# Patient Record
Sex: Male | Born: 1950 | Race: Asian | Hispanic: No | Marital: Married | Smoking: Former smoker
Health system: Southern US, Community
[De-identification: ages and names within clinical notes are randomized; demographics above are authoritative.]

## PROBLEM LIST (undated history)

## (undated) DIAGNOSIS — I10 Essential (primary) hypertension: Secondary | ICD-10-CM

## (undated) DIAGNOSIS — R3915 Urgency of urination: Secondary | ICD-10-CM

## (undated) DIAGNOSIS — N400 Enlarged prostate without lower urinary tract symptoms: Secondary | ICD-10-CM

## (undated) DIAGNOSIS — K219 Gastro-esophageal reflux disease without esophagitis: Secondary | ICD-10-CM

## (undated) DIAGNOSIS — E785 Hyperlipidemia, unspecified: Secondary | ICD-10-CM

## (undated) DIAGNOSIS — N433 Hydrocele, unspecified: Secondary | ICD-10-CM

## (undated) DIAGNOSIS — Z8711 Personal history of peptic ulcer disease: Secondary | ICD-10-CM

## (undated) HISTORY — PX: ABDOMINAL SURGERY: SHX537

## (undated) HISTORY — DX: Hyperlipidemia, unspecified: E78.5

## (undated) HISTORY — PX: EYE SURGERY: SHX253

## (undated) HISTORY — PX: HERNIA REPAIR: SHX51

---

## 2012-11-25 ENCOUNTER — Encounter (HOSPITAL_COMMUNITY): Payer: Self-pay | Admitting: Emergency Medicine

## 2012-11-25 ENCOUNTER — Emergency Department (HOSPITAL_COMMUNITY)
Admission: EM | Admit: 2012-11-25 | Discharge: 2012-11-25 | Disposition: A | Discharge status: Home / Self Care | Payer: BC Managed Care – PPO | Attending: Emergency Medicine | Admitting: Emergency Medicine

## 2012-11-25 ENCOUNTER — Emergency Department (HOSPITAL_COMMUNITY): Payer: BC Managed Care – PPO

## 2012-11-25 ENCOUNTER — Ambulatory Visit (INDEPENDENT_AMBULATORY_CARE_PROVIDER_SITE_OTHER): Payer: BC Managed Care – PPO | Admitting: Emergency Medicine

## 2012-11-25 VITALS — BP 178/110 | HR 80 | Temp 98.5°F | Resp 16 | Ht 60.0 in | Wt 122.0 lb

## 2012-11-25 DIAGNOSIS — E860 Dehydration: Secondary | ICD-10-CM

## 2012-11-25 DIAGNOSIS — Z87891 Personal history of nicotine dependence: Secondary | ICD-10-CM | POA: Insufficient documentation

## 2012-11-25 DIAGNOSIS — E119 Type 2 diabetes mellitus without complications: Secondary | ICD-10-CM | POA: Insufficient documentation

## 2012-11-25 DIAGNOSIS — Z8639 Personal history of other endocrine, nutritional and metabolic disease: Secondary | ICD-10-CM | POA: Insufficient documentation

## 2012-11-25 DIAGNOSIS — I10 Essential (primary) hypertension: Secondary | ICD-10-CM

## 2012-11-25 DIAGNOSIS — R000 Tachycardia, unspecified: Secondary | ICD-10-CM

## 2012-11-25 DIAGNOSIS — Z862 Personal history of diseases of the blood and blood-forming organs and certain disorders involving the immune mechanism: Secondary | ICD-10-CM | POA: Insufficient documentation

## 2012-11-25 DIAGNOSIS — R002 Palpitations: Secondary | ICD-10-CM | POA: Insufficient documentation

## 2012-11-25 DIAGNOSIS — R079 Chest pain, unspecified: Secondary | ICD-10-CM

## 2012-11-25 DIAGNOSIS — I200 Unstable angina: Secondary | ICD-10-CM

## 2012-11-25 DIAGNOSIS — R0789 Other chest pain: Secondary | ICD-10-CM | POA: Insufficient documentation

## 2012-11-25 HISTORY — DX: Essential (primary) hypertension: I10

## 2012-11-25 LAB — CBC
MCH: 30.2 pg (ref 26.0–34.0)
MCHC: 35.4 g/dL (ref 30.0–36.0)
Platelets: 278 10*3/uL (ref 150–400)
RBC: 5.23 MIL/uL (ref 4.22–5.81)
RDW: 13 % (ref 11.5–15.5)

## 2012-11-25 LAB — BASIC METABOLIC PANEL
CO2: 23 mEq/L (ref 19–32)
GFR calc non Af Amer: >90 mL/min (ref >90)
Glucose, Bld: 100 mg/dL — ABNORMAL HIGH (ref 70–99)
Potassium: 3.5 mEq/L (ref 3.5–5.1)
Sodium: 138 mEq/L (ref 135–145)

## 2012-11-25 LAB — URINALYSIS, ROUTINE W REFLEX MICROSCOPIC
Hgb urine dipstick: NEGATIVE
Specific Gravity, Urine: 1.012 (ref 1.005–1.030)
Urobilinogen, UA: 0.2 mg/dL (ref 0.0–1.0)

## 2012-11-25 LAB — POCT I-STAT TROPONIN I: Troponin i, poc: 0 ng/mL (ref 0.00–0.08)

## 2012-11-25 MED ORDER — HYDROCHLOROTHIAZIDE 25 MG PO TABS
25.0000 mg | ORAL_TABLET | Freq: Once | ORAL | Status: AC
  Administered 2012-11-25: 25 mg via ORAL
  Filled 2012-11-25: qty 1

## 2012-11-25 MED ORDER — HYDROCHLOROTHIAZIDE 25 MG PO TABS: 25.0000 mg | ORAL_TABLET | Freq: Once | ORAL | Status: DC

## 2012-11-25 MED ORDER — LISINOPRIL 20 MG PO TABS
20.0000 mg | ORAL_TABLET | Freq: Once | ORAL | Status: AC
  Administered 2012-11-25: 20 mg via ORAL
  Filled 2012-11-25: qty 1

## 2012-11-25 MED ORDER — ASPIRIN 81 MG PO CHEW
324.0000 mg | CHEWABLE_TABLET | Freq: Once | ORAL | Status: AC
  Administered 2012-11-25: 324 mg via ORAL

## 2012-11-25 MED ORDER — LISINOPRIL 20 MG PO TABS: 20.0000 mg | ORAL_TABLET | Freq: Once | ORAL | Status: DC

## 2012-11-25 NOTE — ED Provider Notes (Signed)
Medical screening examination/treatment/procedure(s) were conducted as a shared visit with non-physician practitioner(s) and myself.  I personally evaluated the patient during the encounter  Lance Oneal is a 62 y.o. male hx of HTN here with intermittent chest pain and palpitations. Has been going on for several weeks. Went to urgent care and sent for eval. No chest pain currently. Was hypertensive that improved with PO meds. Trop neg x 2. Will d/c home on HCTZ, lisinopril. Recommend cardiology and pmd f/u.    Richardean Canal, MD 11/25/12 [phone]

## 2012-11-25 NOTE — ED Notes (Signed)
Pt denies chest pain currently. Pt alert and mentating appropriately. NAD noted at this time.

## 2012-11-25 NOTE — ED Notes (Signed)
Pt presents to ED via EMS with c/o chest pain and heart racing while urinating at night.. Pt from urgent care. NAD.

## 2012-11-25 NOTE — Progress Notes (Signed)
The patient was brought to room 7 urgently due to unstable angina.  Placed on O2 [place] 2 L. He received 324 mg ASA chewed.  IV started, LEFT AC, NS KVO. Monitor reveals a rate of 74, NSR.  He denies pain, SOB, and asks if he can go to sleep. EMS contacted for transport.  Fernande Bras, PA-C Certified Physician Assistant [facility] 10:20 am

## 2012-11-25 NOTE — Progress Notes (Signed)
[facility] [address] [place] [address] [phone]  Date:  11/25/2012   Name:  Lance Oneal   DOB:  11-20-1950   MRN:  [id number]  PCP:  No primary provider on file.    Chief Complaint: Tachycardia and Testicle Pain   History of Present Illness:  Lance Oneal is a 62 y.o. very pleasant male patient who presents with the following:  Patient describes months long duration intermittent sensation of rapid heart rate associated with pounding forceful beats.  Associated at times with chest pressure that is not radiating.  Says spells last minutes to 10's of minutes and pass with rest.  Occasionally has pressure in his left chest as he is having now.  Says it is "normal" for him.  He takes no medications and stopped smoking 7 years ago.  Gets regular exercise.  No shortness of breath, nausea or diaphoresis and no radiation of pain.  Unclear about when pain started today.  No improvement with over the counter medications or other home remedies. Denies other complaint or health concern today.   There are no active problems to display for this patient.   History reviewed. No pertinent past medical history.  Past Surgical History  Procedure Laterality Date  . Hernia repair      History  Substance Use Topics  . Smoking status: Former Games developer  . Smokeless tobacco: Not on file  . Alcohol Use: Yes    No family history on file.  No Known Allergies  Medication list has been reviewed and updated.  No current outpatient prescriptions on file prior to visit.   No current facility-administered medications on file prior to visit.    Review of Systems:  As per HPI, otherwise negative.    Physical Examination: Filed Vitals:   11/25/12 0910  BP: 178/110  Pulse: 80  Temp: 98.5 F (36.9 C)  Resp: 16   Filed Vitals:   11/25/12 0910  Height: 5' (1.524 m)  Weight: 122 lb (55.339 kg)   Body mass index is 23.83 kg/(m^2). Ideal Body Weight: Weight in (lb) to have  BMI = 25: 127.7  GEN: WDWN, NAD, Non-toxic, A & O x 3 HEENT: Atraumatic, Normocephalic. Neck supple. No masses, No LAD. Ears and Nose: No external deformity. CV: RRR, No M/G/R. No JVD. No thrill. No extra heart sounds. PULM: CTA B, no wheezes, crackles, rhonchi. No retractions. No resp. distress. No accessory muscle use. ABD: S, NT, ND, +BS. No rebound. No HSM. EXTR: No c/c/e NEURO Normal gait.  PSYCH: Normally interactive. Conversant. Not depressed or anxious appearing.  Calm demeanor.    Assessment and Plan: Chest pain PSVT by history ER evaluation   Signed,  Phillips Odor, MD  EKG unremarkable

## 2012-11-25 NOTE — ED Provider Notes (Signed)
History     CSN: [id number]  Arrival date & time 11/25/12  1044   First MD Initiated Contact with Patient 11/25/12 1054      Chief Complaint  Patient presents with  . Chest Pain    (Consider location/radiation/quality/duration/timing/severity/associated sxs/prior treatment) HPI Comments: Patient presents from [place] urgent care with complaint of intermittent chest "heaviness" intermittently for the past several weeks as well as complaint of racing heart and strong heart beats when he awakes at night to urinate. No lightheadedness or syncope with these episodes. Symptoms resolve after a short period of time. Patient is [place] but speaks [place]. He has not seen a doctor in at least 7 years. He has been told in the past that he has high blood pressure but is not currently on any medications for this. He does not know if he has a history of hypercholesterolemia, diabetes. Patient is a remote smoker. Patient states that his mother had a history of high blood pressure. Patient is currently in no distress or pain. No treatments prior to arrival. Onset of symptoms acute. Course is intermittent. Nothing makes symptoms better or worse.  Patient is a 62 y.o. male presenting with chest pain. The history is provided by the patient.  Chest Pain Associated symptoms: palpitations   Associated symptoms: no abdominal pain, no back pain, no cough, no diaphoresis, no fever, no nausea, no shortness of breath and not vomiting     Past Medical History  Diagnosis Date  . Hypertension     Past Surgical History  Procedure Laterality Date  . Hernia repair      History reviewed. No pertinent family history.  History  Substance Use Topics  . Smoking status: Former Games developer  . Smokeless tobacco: Not on file  . Alcohol Use: Yes      Review of Systems  Constitutional: Negative for fever and diaphoresis.  HENT: Negative for neck pain.   Eyes: Negative for redness.  Respiratory: Negative for cough  and shortness of breath.   Cardiovascular: Positive for chest pain and palpitations. Negative for leg swelling.  Gastrointestinal: Negative for nausea, vomiting and abdominal pain.  Genitourinary: Negative for dysuria.  Musculoskeletal: Negative for back pain.  Skin: Negative for rash.  Neurological: Negative for syncope and light-headedness.    Allergies  Review of patient's allergies indicates no known allergies.  Home Medications   Current Outpatient Rx  Name  Route  Sig  Dispense  Refill  . fish oil-omega-3 fatty acids 1000 MG capsule   Oral   Take 1 g by mouth daily.         . hydrochlorothiazide (HYDRODIURIL) 25 MG tablet   Oral   Take 1 tablet (25 mg total) by mouth once.   30 tablet   1   . lisinopril (PRINIVIL,ZESTRIL) 20 MG tablet   Oral   Take 1 tablet (20 mg total) by mouth once.   30 tablet   1     BP 179/106  Temp(Src) 98.2 F (36.8 C) (Oral)  Resp 14  SpO2 100%  Physical Exam  Nursing note and vitals reviewed. Constitutional: He appears well-developed and well-nourished.  HENT:  Head: Normocephalic and atraumatic.  Mouth/Throat: Mucous membranes are normal. Mucous membranes are not dry.  Eyes: Conjunctivae are normal.  Neck: Trachea normal and normal range of motion. Neck supple. Normal carotid pulses and no JVD present. No muscular tenderness present. Carotid bruit is not present. No tracheal deviation present.  Cardiovascular: Normal rate, regular rhythm, S1 normal, S2  normal, normal heart sounds and intact distal pulses.  Exam reveals no distant heart sounds and no decreased pulses.   No murmur heard. Pulmonary/Chest: Effort normal and breath sounds normal. No respiratory distress. He has no wheezes. He exhibits no tenderness.  Abdominal: Soft. Normal aorta and bowel sounds are normal. There is no tenderness. There is no rebound and no guarding.  Musculoskeletal: He exhibits no edema.  Neurological: He is alert.  Skin: Skin is warm and dry. He  is not diaphoretic. No cyanosis. No pallor.  Psychiatric: He has a normal mood and affect.    ED Course  Procedures (including critical care time)  Labs Reviewed  BASIC METABOLIC PANEL - Abnormal; Notable for the following:    Glucose, Bld 100 (*)    All other components within normal limits  CBC  URINALYSIS, ROUTINE W REFLEX MICROSCOPIC  POCT I-STAT TROPONIN I  POCT I-STAT TROPONIN I   Dg Chest 2 View  11/25/2012   *RADIOLOGY REPORT*  Clinical Data: Heart palpitations  CHEST - 2 VIEW  Comparison: None.  Findings: Normal heart, mediastinal, hilar contours.  The lungs are normally expanded and clear. No acute bony abnormality.  IMPRESSION: No acute cardiopulmonary disease.   Original Report Authenticated By: Britta Mccreedy, M.D.    1. Palpitations   2. Chest pain   3. Hypertension     11:07 AM Patient seen and examined. Work-up initiated. Medications ordered.   Vital signs reviewed and are as follows: Filed Vitals:   11/25/12 1055  BP: 179/106  Temp: 98.2 F (36.8 C)  Resp: 14    Date: 11/25/2012  Rate: 59  Rhythm: normal sinus rhythm  QRS Axis: left  Intervals: normal  ST/T Wave abnormalities: nonspecific T wave changes  Conduction Disutrbances:left anterior fascicular block  Narrative Interpretation:   Old EKG Reviewed: none available  12:48 PM Patient d/w and seen by Dr. Silverio Lay. HTN meds given. Will prescribe for home. Will give cardiology referral. Awaiting 2nd marker prior to discharge.   3:18 PM 2nd troponin is negative. Will d/c to home.   Patient was informed of high blood pressure reading today.  Patient was counseled about long-term health problems hypertension can cause and was urged to follow-up with a primary care doctor in the next week for a blood pressure recheck.  Urged to take medication everyday. Patient verbalized understanding.  Cardiology referral given and patient urged to f/u.   Patient was counseled to return with severe chest pain, especially  if the pain is crushing or pressure-like and spreads to the arms, back, neck, or jaw, or if they have sweating, nausea, or shortness of breath with the pain. They were encouraged to call 911 with these symptoms.   They were also told to return if their chest pain gets worse and does not go away with rest, they have an attack of chest pain lasting longer than usual despite rest and treatment with the medications their caregiver has prescribed, if they wake from sleep with chest pain or shortness of breath, if they feel dizzy or faint, if they have chest pain not typical of their usual pain, or if they have any other emergent concerns regarding their health.  The patient verbalized understanding and agreed.   BP 165/96  Pulse 94  Temp(Src) 98.2 F (36.8 C) (Oral)  Resp 23  SpO2 99%     MDM  Patient with cc: palpitations and occasional chest heaviness not present now. EKG non-ischemic. CXR neg. Labs reassuring, normal renal fcn.  BP persistently elevated here, lisinopril and hctz ordered. Pt to be d/c home on these per JNC 7 guidelines stage 2 HTN, no compelling indications. Cardiology referral for complete risk assessment, eval of palpitations. Do not suspect PE.         Makhai Fulco, PA-C 11/25/12 [phone]

## 2012-11-25 NOTE — ED Notes (Signed)
Pt discharged to home with family. NAD.  

## 2012-12-02 ENCOUNTER — Ambulatory Visit (INDEPENDENT_AMBULATORY_CARE_PROVIDER_SITE_OTHER): Payer: BC Managed Care – PPO | Admitting: Family Medicine

## 2012-12-02 VITALS — BP 130/71 | HR 69 | Temp 97.9°F | Resp 16 | Ht 60.78 in | Wt 122.2 lb

## 2012-12-02 DIAGNOSIS — I10 Essential (primary) hypertension: Secondary | ICD-10-CM

## 2012-12-02 MED ORDER — HYDROCHLOROTHIAZIDE 25 MG PO TABS: 25.0000 mg | ORAL_TABLET | Freq: Once | ORAL | Status: DC

## 2012-12-02 MED ORDER — LISINOPRIL 20 MG PO TABS: 20.0000 mg | ORAL_TABLET | Freq: Once | ORAL | Status: DC

## 2012-12-02 NOTE — Patient Instructions (Signed)

## 2012-12-02 NOTE — Progress Notes (Signed)
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 He was seen in the emergency department several days ago and a complete workup was done the air revealing no heart disease or underlying renovascular disease. He started on lisinopril and hydrochlorothiazide and has felt fine since. He needs refills on his prescriptions.  Objective: No acute distress, blood pressure recheck 125/80 Neck: Supple no adenopathy or bruits Chest: Clear Heart: Regular no murmur Results for orders placed during the hospital encounter of 11/25/12  CBC      Result Value Range   WBC 8.0  4.0 - 10.5 K/uL   RBC 5.23  4.22 - 5.81 MIL/uL   Hemoglobin 15.8  13.0 - 17.0 g/dL   HCT 16.1  09.6 - 04.5 %   MCV 85.3  78.0 - 100.0 fL   MCH 30.2  26.0 - 34.0 pg   MCHC 35.4  30.0 - 36.0 g/dL   RDW 40.9  81.1 - 91.4 %   Platelets 278  150 - 400 K/uL  BASIC METABOLIC PANEL      Result Value Range   Sodium 138  135 - 145 mEq/L   Potassium 3.5  3.5 - 5.1 mEq/L   Chloride 104  96 - 112 mEq/L   CO2 23  19 - 32 mEq/L   Glucose, Bld 100 (*) 70 - 99 mg/dL   BUN 17  6 - 23 mg/dL   Creatinine, Ser 7.82  0.50 - 1.35 mg/dL   Calcium 8.8  8.4 - 95.6 mg/dL   GFR calc non Af Amer >90  >90 mL/min   GFR calc Af Amer >90  >90 mL/min  URINALYSIS, ROUTINE W REFLEX MICROSCOPIC      Result Value Range   Color, Urine YELLOW  YELLOW   APPearance CLEAR  CLEAR   Specific Gravity, Urine 1.012  1.005 - 1.030   pH 7.5  5.0 - 8.0   Glucose, UA NEGATIVE  NEGATIVE mg/dL   Hgb urine dipstick NEGATIVE  NEGATIVE   Bilirubin Urine NEGATIVE  NEGATIVE   Ketones, ur NEGATIVE  NEGATIVE mg/dL   Protein, ur NEGATIVE  NEGATIVE mg/dL   Urobilinogen, UA 0.2  0.0 - 1.0 mg/dL   Nitrite NEGATIVE  NEGATIVE   Leukocytes, UA NEGATIVE  NEGATIVE  POCT I-STAT TROPONIN I      Result Value Range   Troponin i, poc 0.00  0.00 - 0.08 ng/mL   Comment 3           POCT I-STAT TROPONIN I      Result Value Range   Troponin i,  poc 0.00  0.00 - 0.08 ng/mL   Comment 3            Assessment: Hypertension, controlled  Hypertension - Plan: lisinopril (PRINIVIL,ZESTRIL) 20 MG tablet, hydrochlorothiazide (HYDRODIURIL) 25 MG tablet  I've asked patient to come back in 3 months when he is near running out of his medicines Maureen Ralphs check his pressure and possibly reduce his antihypertensive regime.  Signed, PERRLA

## 2012-12-30 ENCOUNTER — Ambulatory Visit (INDEPENDENT_AMBULATORY_CARE_PROVIDER_SITE_OTHER): Payer: BC Managed Care – PPO | Admitting: Physician Assistant

## 2012-12-30 VITALS — BP 121/65 | HR 83 | Temp 98.0°F | Resp 16 | Ht 61.0 in | Wt 122.6 lb

## 2012-12-30 DIAGNOSIS — I10 Essential (primary) hypertension: Secondary | ICD-10-CM

## 2012-12-30 DIAGNOSIS — R05 Cough: Secondary | ICD-10-CM

## 2012-12-30 DIAGNOSIS — R059 Cough, unspecified: Secondary | ICD-10-CM

## 2012-12-30 MED ORDER — LOSARTAN POTASSIUM 50 MG PO TABS: 50.0000 mg | ORAL_TABLET | Freq: Every day | ORAL | Status: DC

## 2012-12-30 NOTE — Patient Instructions (Addendum)
Continue to take hydrochlorothiazide.  Stop the lisinopril, we think it may be causing you to cough and have a dry throat.  Start taking losartan.

## 2012-12-30 NOTE — Progress Notes (Signed)
  Subjective:    Patient ID: Lance Oneal, male    DOB: [birth year], 62 y.o.   MRN: [id number]  HPI  Presents with dry throat and cough x 3-4 weeks. Communication is limited by language barrier. States his BP was high at a previous visit and he was placed on lisinopril and HCTZ. Cough is worse early morning, late evening. Cough is dry and non productive, no blood. Identifies painful area on left side of neck. States he otherwise feels fine. Denies: fever/chills, nasal congestion, rhinorrhea, nausea/vomiting.   Review of Systems Denies: fever, headache, vision changes, SOB, chest pain, urinary symptoms.     Objective:   Physical Exam  BP 121/65  Pulse 83  Temp(Src) 98 F (36.7 C) (Oral)  Resp 16  Ht 5\' 1"  (1.549 m)  Wt 122 lb 9.6 oz (55.611 kg)  BMI 23.18 kg/m2  SpO2 100%  General: WDWN male, appears stated age, NAD, mild language barrier HEENT: normocephalic, atraumatic, PERLA, TMs clear with visible bony landmarks, turbinates normal no visible discharge, uvula midline, posterior pharynx and tonsils without erythema edema exudate, no palpable lymphadenopathy Resp: clear to auscultation anterior & posterior bilaterally, no rales/rhonchi/wheezes Cardiac: RRR, no murmurs/rubs/gallops Extremities: moves all limbs spontaneously, radial pulses present and even bilaterally, no pitting edema      Assessment & Plan:  Cough  HTN (hypertension) - Plan: losartan (COZAAR) 50 MG tablet  Suspect cough and irritated throat are caused by lisinopril. D/C lisinopril.  Continue HCTZ.  Begin losartan.  Return if cough does not improve.

## 2013-01-01 DIAGNOSIS — I10 Essential (primary) hypertension: Secondary | ICD-10-CM | POA: Insufficient documentation

## 2013-02-26 ENCOUNTER — Ambulatory Visit (INDEPENDENT_AMBULATORY_CARE_PROVIDER_SITE_OTHER): Payer: BC Managed Care – PPO | Admitting: Family Medicine

## 2013-02-26 ENCOUNTER — Encounter: Payer: Self-pay | Admitting: Family Medicine

## 2013-02-26 ENCOUNTER — Encounter: Payer: Self-pay | Admitting: Internal Medicine

## 2013-02-26 VITALS — BP 128/76 | HR 72 | Temp 98.6°F | Ht 61.0 in | Wt 123.4 lb

## 2013-02-26 DIAGNOSIS — I10 Essential (primary) hypertension: Secondary | ICD-10-CM

## 2013-02-26 DIAGNOSIS — Z0000 Encounter for general adult medical examination without abnormal findings: Secondary | ICD-10-CM

## 2013-02-26 LAB — BASIC METABOLIC PANEL
BUN: 23 mg/dL (ref 6–23)
Calcium: 9.2 mg/dL (ref 8.4–10.5)
Creatinine, Ser: 1 mg/dL (ref 0.4–1.5)
GFR: 82.42 mL/min (ref >60.00)
Glucose, Bld: 98 mg/dL (ref 70–99)
Sodium: 137 mEq/L (ref 135–145)

## 2013-02-26 LAB — CBC WITH DIFFERENTIAL/PLATELET
Basophils Absolute: 0 10*3/uL (ref 0.0–0.1)
Hemoglobin: 13.2 g/dL (ref 13.0–17.0)
Lymphocytes Relative: 25.7 % (ref 12.0–46.0)
Monocytes Relative: 6.2 % (ref 3.0–12.0)
Neutrophils Relative %: 66.2 % (ref 43.0–77.0)
Platelets: 319 10*3/uL (ref 150.0–400.0)
RDW: 14.1 % (ref 11.5–14.6)

## 2013-02-26 LAB — LIPID PANEL
HDL: 38.7 mg/dL — ABNORMAL LOW (ref >39.00)
Total CHOL/HDL Ratio: 6
VLDL: 29 mg/dL (ref 0.0–40.0)

## 2013-02-26 LAB — HEPATIC FUNCTION PANEL
AST: 26 U/L (ref 0–37)
Alkaline Phosphatase: 56 U/L (ref 39–117)
Total Bilirubin: 1.2 mg/dL (ref 0.3–1.2)

## 2013-02-26 LAB — PSA: PSA: 1.46 ng/mL (ref 0.10–4.00)

## 2013-02-26 LAB — TSH: TSH: 0.72 u[IU]/mL (ref 0.35–5.50)

## 2013-02-26 NOTE — Patient Instructions (Signed)
T?ng Huy?t p (Hypertension) Khi tim ??p, n ??y mu l?u thng qua cc ??ng m?ch. L?c ??y ny ???c g?i l huy?t p. N?u huy?t p qu cao, ng??i ta g?i ? l ch?ng t?ng huy?t p (HTN) hay cao huy?t p. Ch?ng t?ng huy?t p r?t nguy hi?m v b?n c th? m?c ph?i m khng hay bi?t g. Huy?t p cao c ngh?a l tim c?a b?n ph?i lm vi?c nhi?u h?n ?? b?m mu. Cc ??ng m?ch c th? b? h?p ho?c x? c?ng. T?ng cng cho tim lm b?n c nguy c? m?c b?nh tim, ??t qu?, v cc b?nh l khc. Huy?t p bao g?m hai con s?, m?t ch? s? cao h?n trn m?t ch? s? th?p h?n, v d? nh? 110/72. Huy?t p ???c ghi l "110 trn 72". L t??ng l d??i 120 cho ch? s? trn (tm thu) v d??i 80 cho ch? s? d??i (tm tr??ng). Ghi huy?t p c?a b?n ngy hm nay.  B?n nn h?t s?c ch  ??n huy?t p c?a mnh n?u ?ang m?c ph?i nh?ng c?n b?nh no ? nh? l:  Suy tim  Ti?n s? b? ?au tim  Ti?u ???ng  B?nh th?n mn tnh  Ti?n s? ??t qu?  Nhi?u nguy c? m?c b?nh tim. ?? xem c b? m?c ch?ng t?ng huy?t p hay khng, b?n nn ?o huy?t p khi ?ang ng?i v?i cnh tay ???c ??t ngang t?m tim c?a b?n. Nn ?o huy?t p t nh?t hai l?n. Ch? s? huy?t p cao m?t l?n (??c bi?t l ? [place]hoa C?p C?u) khng c ngh?a l b?n c?n ph?i ???c ?i?u tr?. C th? c cc c?n b?nh m huy?t p khc nhau gi?a tay tri v tay ph?i. ?i?u quan tr?ng l ph?i s?m ?i khm ?? Bc s? ki?m tra l?i. ?a s? ng??i m?c ph?i ch?ng t?ng huy?t p nguyn pht, c ngh?a l khng c nguyn nhn c? th?. C th? lm gi?m d?ng cao huy?t p ny b?ng cch thay ??i y?u t? l?i s?ng nh? l:  C?ng th?ng  Ht thu?c  Thi?u t?p th? d?c  Th?a cn  S? d?ng ma ty/thu?c l/r??u  Ch? ?? ?n t mu?i ?a s? ng??i khng c cc tri?u ch?ng do cao huy?t p cho ??n khi b?nh gy t?n h?i ??n c? th?. Vi?c ?i?u tr? hi?u qu? th??ng c th? ng?n ch?n, tr hon hay gi?m m?c ?? t?n h?i ?. ?I?U TR? Vi?c ?i?u tr? ch?ng cao huy?t p, khi ? xc ??nh ???c nguyn nhn, ???c nh?m vo nguyn nhn gy b?nh ?. C r?t nhi?u thu?c  ?i?u tr? ch?ng t?ng huy?t p. Cc thu?c ny c th? ???c chia ra thnh vi lo?i, v Bc s? s? gip b?n ch?n thu?c t?t nh?t cho mnh. Thu?c c th? c cc tc d?ng ph?. B?n v Bc s? nn xem l?i nh?ng tc d?ng ph? ?. N?u huy?t p v?n cao sau khi b?n ? thay ??i l?i s?ng ho?c b?t ??u dng thu?c th,  C th? c?n ph?i thay ??i (cc) thu?c.  C th? c?n ph?i ch tm ??n nh?ng v?n ?? khc.  Ph?i ch?c ch?n r?ng b?n hi?u toa thu?c, v bi?t r khi no dng thu?c v dng thu?c nh? th? no.  Ph?i ch?c ch?n r?ng b?n g?p Bc s? ?? ti khm trong khung th?i gian ???c khuy?n co (th??ng l trong vng hai tu?n) ?? ki?m tra l?i huy?t p v xem l?i thu?c.  N?u dng nhi?u h?n m?t lo?i thu?c ?? h?   huy?t p, ph?i ch?c ch?n r?ng b?n bi?t khi no nn dng thu?c v dng nh? th? no. Dng cng lc hai lo?i thu?c c th? d?n ??n huy?t p h? qu th?p. NH?P VI?N NGAY L?P T?C N?U XU?T HI?N:  Nh?c ??u d? d?i, th? l?c thay ??i hay b? m?, ho?c l l?n.  Y?u ho?c t li?t b?t th??ng, ho?c c c?m gic chong ng?t.  ?au b?ng hay ng?c tr?m tr?ng, i m?a, ho?c kh th?. HY CH?C CH?N R?NG B?N:  Hi?u nh?ng ch? d?n ny.  S? theo di tnh tr?ng c?a b?n.  S? nh?n ???c s? gip ?? ngay l?p t?c n?u b?n khng kh?e ho?c tr? nn t? h?n. Document Released: 06/27/2005 Document Revised: 09/19/2011 [employer] Patient Information 2014 [employer].  

## 2013-02-27 LAB — POCT URINALYSIS DIPSTICK
Blood, UA: NEGATIVE
Glucose, UA: NEGATIVE
Nitrite, UA: NEGATIVE
Protein, UA: NEGATIVE
Urobilinogen, UA: 0.2

## 2013-02-27 NOTE — Progress Notes (Signed)
  Subjective:    Lance Oneal is a 62 y.o. male who presents for evaluation of elevated blood pressures. Age at onset of elevated blood pressure:  61. Cardiac symptoms: none. Patient denies: chest pain, chest pressure/discomfort, claudication, dyspnea, exertional chest pressure/discomfort, fatigue, irregular heart beat, lower extremity edema, near-syncope, orthopnea, palpitations, paroxysmal nocturnal dyspnea, syncope and tachypnea. Cardiovascular risk factors: none. Use of agents associated with hypertension: none. History of target organ damage: none.  The following portions of the patient's history were reviewed and updated as appropriate: allergies, current medications, past family history, past medical history, past social history, past surgical history and problem list.  Review of Systems Pertinent items are noted in HPI.   Objective:    BP 128/76  Pulse 72  Temp(Src) 98.6 F (37 C) (Oral)  Ht 5\' 1"  (1.549 m)  Wt 123 lb 6.4 oz (55.974 kg)  BMI 23.33 kg/m2  SpO2 98% General appearance: alert, cooperative, appears stated age and no distress Throat: lips, mucosa, and tongue normal; teeth and gums normal Neck: no adenopathy, no carotid bruit, no JVD, supple, symmetrical, trachea midline and thyroid not enlarged, symmetric, no tenderness/mass/nodules Lungs: clear to auscultation bilaterally Extremities: extremities normal, atraumatic, no cyanosis or edema Cor--+s1S2  No murmur Cardiographics ECG: not done today, pt was in a rush    Assessment:    Hypertension, stage 1 . Evidence of target organ damage: none.    Plan:    Medication: no change. Dietary sodium restriction. Regular aerobic exercise. Check blood pressures 2-3 times weekly and record. Follow up: 3 months and as needed. ---for  cpe---pt unable to do cpe today---he had to be at work by nine-- we will reschedule cpe and refill pt meds

## 2013-04-07 ENCOUNTER — Ambulatory Visit (INDEPENDENT_AMBULATORY_CARE_PROVIDER_SITE_OTHER): Payer: BC Managed Care – PPO | Admitting: Radiology

## 2013-04-07 DIAGNOSIS — Z23 Encounter for immunization: Secondary | ICD-10-CM

## 2013-04-16 ENCOUNTER — Telehealth: Payer: Self-pay

## 2013-04-16 NOTE — Telephone Encounter (Signed)
Has Colonoscopy scheduled

## 2013-04-16 NOTE — Telephone Encounter (Signed)
Medication List and allergies: done  Pharmacy updated, uses [facility] [address] for 90 day supply Pharmacy undated, uses [facility] for local prescriptions  HM UTD: had flu vaccine 04/07/2013  A/P: HM due: CCS PSA: No record Last: CCS: Patient reports never had one  DM: NA HTN:    due  To Discuss with Provider: No issues at this time

## 2013-04-17 ENCOUNTER — Encounter: Payer: Self-pay | Admitting: Family Medicine

## 2013-04-17 ENCOUNTER — Ambulatory Visit (INDEPENDENT_AMBULATORY_CARE_PROVIDER_SITE_OTHER): Payer: BC Managed Care – PPO | Admitting: Family Medicine

## 2013-04-17 VITALS — BP 112/76 | HR 77 | Temp 98.4°F | Ht 61.0 in | Wt 120.6 lb

## 2013-04-17 DIAGNOSIS — Z23 Encounter for immunization: Secondary | ICD-10-CM

## 2013-04-17 DIAGNOSIS — R350 Frequency of micturition: Secondary | ICD-10-CM

## 2013-04-17 DIAGNOSIS — I10 Essential (primary) hypertension: Secondary | ICD-10-CM

## 2013-04-17 DIAGNOSIS — N50812 Left testicular pain: Secondary | ICD-10-CM | POA: Insufficient documentation

## 2013-04-17 DIAGNOSIS — Z2911 Encounter for prophylactic immunotherapy for respiratory syncytial virus (RSV): Secondary | ICD-10-CM

## 2013-04-17 DIAGNOSIS — Z0000 Encounter for general adult medical examination without abnormal findings: Secondary | ICD-10-CM

## 2013-04-17 DIAGNOSIS — N50811 Right testicular pain: Secondary | ICD-10-CM

## 2013-04-17 DIAGNOSIS — N509 Disorder of male genital organs, unspecified: Secondary | ICD-10-CM

## 2013-04-17 LAB — POCT URINALYSIS DIPSTICK
Glucose, UA: NEGATIVE
Ketones, UA: NEGATIVE
Spec Grav, UA: 1.015
Urobilinogen, UA: NEGATIVE

## 2013-04-17 NOTE — Patient Instructions (Signed)
Preventive Care for Adults, Male  A healthy lifestyle and preventive care can promote health and wellness. Preventive health guidelines for men include the following key practices:  · A routine yearly physical is a good way to check with your caregiver about your health and preventative screening. It is a chance to share any concerns and updates on your health, and to receive a thorough exam.  · Visit your dentist for a routine exam and preventative care every 6 months. Brush your teeth twice a day and floss once a day. Good oral hygiene prevents tooth decay and gum disease.  · The frequency of eye exams is based on your age, health, family medical history, use of contact lenses, and other factors. Follow your caregiver's recommendations for frequency of eye exams.  · Eat a healthy diet. Foods like vegetables, fruits, whole grains, low-fat dairy products, and lean protein foods contain the nutrients you need without too many calories. Decrease your intake of foods high in solid fats, added sugars, and salt. Eat the right amount of calories for you. Get information about a proper diet from your caregiver, if necessary.  · Regular physical exercise is one of the most important things you can do for your health. Most adults should get at least 150 minutes of moderate-intensity exercise (any activity that increases your heart rate and causes you to sweat) each week. In addition, most adults need muscle-strengthening exercises on 2 or more days a week.  · Maintain a healthy weight. The body mass index (BMI) is a screening tool to identify possible weight problems. It provides an estimate of body fat based on height and weight. Your caregiver can help determine your BMI, and can help you achieve or maintain a healthy weight. For adults 20 years and older:  · A BMI below 18.5 is considered underweight.  · A BMI of 18.5 to 24.9 is normal.  · A BMI of 25 to 29.9 is considered overweight.  · A BMI of 30 and above is  considered obese.  · Maintain normal blood lipids and cholesterol levels by exercising and minimizing your intake of saturated fat. Eat a balanced diet with plenty of fruit and vegetables. Blood tests for lipids and cholesterol should begin at age 20 and be repeated every 5 years. If your lipid or cholesterol levels are high, you are over 50, or you are a high risk for heart disease, you may need your cholesterol levels checked more frequently. Ongoing high lipid and cholesterol levels should be treated with medicines if diet and exercise are not effective.  · If you smoke, find out from your caregiver how to quit. If you do not use tobacco, do not start.  · If you choose to drink alcohol, do not exceed 2 drinks per day. One drink is considered to be 12 ounces (355 mL) of beer, 5 ounces (148 mL) of wine, or 1.5 ounces (44 mL) of liquor.  · Avoid use of street drugs. Do not share needles with anyone. Ask for help if you need support or instructions about stopping the use of drugs.  · High blood pressure causes heart disease and increases the risk of stroke. Your blood pressure should be checked at least every 1 to 2 years. Ongoing high blood pressure should be treated with medicines, if weight loss and exercise are not effective.  · If you are 45 to 62 years old, ask your caregiver if you should take aspirin to prevent heart disease.  · Diabetes screening involves taking   a blood sample to check your fasting blood sugar level. This should be done once every 3 years, after age 45, if you are within normal weight and without risk factors for diabetes. Testing should be considered at a younger age or be carried out more frequently if you are overweight and have at least 1 risk factor for diabetes.  · Colorectal cancer can be detected and often prevented. Most routine colorectal cancer screening begins at the age of 50 and continues through age 75. However, your caregiver may recommend screening at an earlier age if you  have risk factors for colon cancer. On a yearly basis, your caregiver may provide home test kits to check for hidden blood in the stool. Use of a small camera at the end of a tube, to directly examine the colon (sigmoidoscopy or colonoscopy), can detect the earliest forms of colorectal cancer. Talk to your caregiver about this at age 50, when routine screening begins.  Direct examination of the colon should be repeated every 5 to 10 years through age 75, unless early forms of pre-cancerous polyps or small growths are found.  · Hepatitis C blood testing is recommended for all people born from 1945 through 1965 and any individual with known risks for hepatitis C.  · Practice safe sex. Use condoms and avoid high-risk sexual practices to reduce the spread of sexually transmitted infections (STIs). STIs include gonorrhea, chlamydia, syphilis, trichomonas, herpes, HPV, and human immunodeficiency virus (HIV). Herpes, HIV, and HPV are viral illnesses that have no cure. They can result in disability, cancer, and death.  · A one-time screening for abdominal aortic aneurysm (AAA) and surgical repair of large AAAs by sound wave imaging (ultrasonography) is recommended for ages 65 to 75 years who are current or former smokers.  · Healthy men should no longer receive prostate-specific antigen (PSA) blood tests as part of routine cancer screening. Consult with your caregiver about prostate cancer screening.  · Testicular cancer screening is not recommended for adult males who have no symptoms. Screening includes self-exam, caregiver exam, and other screening tests. Consult with your caregiver about any symptoms you have or any concerns you have about testicular cancer.  · Use sunscreen with skin protection factor (SPF) of 30 or more. Apply sunscreen liberally and repeatedly throughout the day. You should seek shade when your shadow is shorter than you. Protect yourself by wearing long sleeves, pants, a wide-brimmed hat, and  sunglasses year round, whenever you are outdoors.  · Once a month, do a whole body skin exam, using a mirror to look at the skin on your back. Notify your caregiver of new moles, moles that have irregular borders, moles that are larger than a pencil eraser, or moles that have changed in shape or color.  · Stay current with required immunizations.  · Influenza. You need a dose every fall (or winter). The composition of the flu vaccine changes each year, so being vaccinated once is not enough.  · Pneumococcal polysaccharide. You need 1 to 2 doses if you smoke cigarettes or if you have certain chronic medical conditions. You need 1 dose at age 65 (or older) if you have never been vaccinated.  · Tetanus, diphtheria, pertussis (Tdap, Td). Get 1 dose of Tdap vaccine if you are younger than age 65 years, are over 65 and have contact with an infant, are a healthcare worker, or simply want to be protected from whooping cough. After that, you need a Td booster dose every 10 years. Consult your   caregiver if you have not had at least 3 tetanus and diphtheria-containing shots sometime in your life or have a deep or dirty wound.  · HPV. This vaccine is recommended for males 13 through 62 years of age. This vaccine may be given to men 22 through 62 years of age who have not completed the 3 dose series. It is recommended for men through age 26 who have sex with men or whose immune system is weakened because of HIV infection, other illness, or medications. The vaccine is given in 3 doses over 6 months.  · Measles, mumps, rubella (MMR). You need at least 1 dose of MMR if you were born in 1957 or later. You may also need a 2nd dose.  · Meningococcal. If you are age 19 to 21 years and a first-year college student living in a residence hall, or have one of several medical conditions, you need to get vaccinated against meningococcal disease. You may also need additional booster doses.  · Zoster (shingles). If you are age 60 years or  older, you should get this vaccine.  · Varicella (chickenpox). If you have never had chickenpox or you were vaccinated but received only 1 dose, talk to your caregiver to find out if you need this vaccine.  · Hepatitis A. You need this vaccine if you have a specific risk factor for hepatitis A virus infection, or you simply wish to be protected from this disease. The vaccine is usually given as 2 doses, 6 to 18 months apart.  · Hepatitis B. You need this vaccine if you have a specific risk factor for hepatitis B virus infection or you simply wish to be protected from this disease. The vaccine is given in 3 doses, usually over 6 months.  Preventative Service / Frequency  Ages 19 to 39  · Blood pressure check.** / Every 1 to 2 years.  · Lipid and cholesterol check.** / Every 5 years beginning at age 20.  · Hepatitis C blood test.** / For any individual with known risks for hepatitis C.  · Skin self-exam. / Monthly.  · Influenza immunization.** / Every year.  · Pneumococcal polysaccharide immunization.** / 1 to 2 doses if you smoke cigarettes or if you have certain chronic medical conditions.  · Tetanus, diphtheria, pertussis (Tdap,Td) immunization. / A one-time dose of Tdap vaccine. After that, you need a Td booster dose every 10 years.  · HPV immunization. / 3 doses over 6 months, if 26 and younger.  · Measles, mumps, rubella (MMR) immunization. / You need at least 1 dose of MMR if you were born in 1957 or later. You may also need a 2nd dose.  · Meningococcal immunization. / 1 dose if you are age 19 to 21 years and a first-year college student living in a residence hall, or have one of several medical conditions, you need to get vaccinated against meningococcal disease. You may also need additional booster doses.  · Varicella immunization.** / Consult your caregiver.  · Hepatitis A immunization.** / Consult your caregiver. 2 doses, 6 to 18 months apart.  · Hepatitis B immunization.** / Consult your caregiver. 3 doses  usually over 6 months.  Ages 40 to 64  · Blood pressure check.** / Every 1 to 2 years.  · Lipid and cholesterol check.** / Every 5 years beginning at age 20.  · Fecal occult blood test (FOBT) of stool. / Every year beginning at age 50 and continuing until age 75. You may not have   to do this test if you get colonoscopy every 10 years.  · Flexible sigmoidoscopy** or colonoscopy.** / Every 5 years for a flexible sigmoidoscopy or every 10 years for a colonoscopy beginning at age 50 and continuing until age 75.  · Hepatitis C blood test.** / For all people born from 1945 through 1965 and any individual with known risks for hepatitis C.  · Skin self-exam. / Monthly.  · Influenza immunization.** / Every year.  · Pneumococcal polysaccharide immunization.** / 1 to 2 doses if you smoke cigarettes or if you have certain chronic medical conditions.  · Tetanus, diphtheria, pertussis (Tdap/Td) immunization.** / A one-time dose of Tdap vaccine. After that, you need a Td booster dose every 10 years.  · Measles, mumps, rubella (MMR) immunization.  / You need at least 1 dose of MMR if you were born in 1957 or later. You may also need a 2nd dose.  · Varicella immunization.**/ Consult your caregiver.  · Meningococcal immunization.** / Consult your caregiver.  · Hepatitis A immunization.** / Consult your caregiver. 2 doses, 6 to 18 months apart.  · Hepatitis B immunization.** / Consult your caregiver. 3 doses, usually over 6 months.  Ages 65 and over  · Blood pressure check.** / Every 1 to 2 years.  · Lipid and cholesterol check.**/ Every 5 years beginning at age 20.  · Fecal occult blood test (FOBT) of stool. / Every year beginning at age 50 and continuing until age 75. You may not have to do this test if you get colonoscopy every 10 years.  · Flexible sigmoidoscopy** or colonoscopy.** / Every 5 years for a flexible sigmoidoscopy or every 10 years for a colonoscopy beginning at age 50 and continuing until age 75.  · Hepatitis C blood  test.** / For all people born from 1945 through 1965 and any individual with known risks for hepatitis C.  · Abdominal aortic aneurysm (AAA) screening.** / A one-time screening for ages 65 to 75 years who are current or former smokers.  · Skin self-exam. / Monthly.  · Influenza immunization.** / Every year.  · Pneumococcal polysaccharide immunization.** / 1 dose at age 65 (or older) if you have never been vaccinated.  · Tetanus, diphtheria, pertussis (Tdap, Td) immunization. / A one-time dose of Tdap vaccine if you are over 65 and have contact with an infant, are a healthcare worker, or simply want to be protected from whooping cough. After that, you need a Td booster dose every 10 years.  · Varicella immunization. ** / Consult your caregiver.  · Meningococcal immunization.** / Consult your caregiver.  · Hepatitis A immunization. ** / Consult your caregiver. 2 doses, 6 to 18 months apart.  · Hepatitis B immunization.** / Check with your caregiver. 3 doses, usually over 6 months.  **Family history and personal history of risk and conditions may change your caregiver's recommendations.  Document Released: 08/23/2001 Document Revised: 09/19/2011 Document Reviewed: 11/22/2010  [employer]® Patient Information ©2014 [employer].

## 2013-04-17 NOTE — Assessment & Plan Note (Signed)
Refer to urology.  ?
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
## 2013-04-17 NOTE — Assessment & Plan Note (Signed)
con't meds stable 

## 2013-04-17 NOTE — Assessment & Plan Note (Signed)
Check culture UA only tr blood and leuk

## 2013-04-18 LAB — URINE CULTURE
Colony Count: NO GROWTH
Organism ID, Bacteria: NO GROWTH

## 2013-04-24 ENCOUNTER — Encounter: Payer: Self-pay | Admitting: Internal Medicine

## 2013-05-06 ENCOUNTER — Ambulatory Visit (AMBULATORY_SURGERY_CENTER): Payer: Self-pay

## 2013-05-06 VITALS — Ht 60.0 in | Wt 120.0 lb

## 2013-05-06 DIAGNOSIS — Z1211 Encounter for screening for malignant neoplasm of colon: Secondary | ICD-10-CM

## 2013-05-07 ENCOUNTER — Encounter: Payer: Self-pay | Admitting: Internal Medicine

## 2013-05-14 ENCOUNTER — Encounter: Payer: BC Managed Care – PPO | Admitting: Internal Medicine

## 2013-06-07 ENCOUNTER — Ambulatory Visit (INDEPENDENT_AMBULATORY_CARE_PROVIDER_SITE_OTHER): Payer: BC Managed Care – PPO | Admitting: Family Medicine

## 2013-06-07 ENCOUNTER — Encounter: Payer: Self-pay | Admitting: Family Medicine

## 2013-06-07 VITALS — BP 116/72 | HR 88 | Temp 96.4°F | Wt 123.0 lb

## 2013-06-07 DIAGNOSIS — Z0000 Encounter for general adult medical examination without abnormal findings: Secondary | ICD-10-CM

## 2013-06-07 DIAGNOSIS — J069 Acute upper respiratory infection, unspecified: Secondary | ICD-10-CM

## 2013-06-07 DIAGNOSIS — I10 Essential (primary) hypertension: Secondary | ICD-10-CM

## 2013-06-07 DIAGNOSIS — E785 Hyperlipidemia, unspecified: Secondary | ICD-10-CM

## 2013-06-07 LAB — BASIC METABOLIC PANEL
CO2: 31 mEq/L (ref 19–32)
Chloride: 102 mEq/L (ref 96–112)
Creatinine, Ser: 1 mg/dL (ref 0.4–1.5)
Potassium: 4.3 mEq/L (ref 3.5–5.1)

## 2013-06-07 LAB — HEPATIC FUNCTION PANEL
ALT: 34 U/L (ref 0–53)
AST: 34 U/L (ref 0–37)
Alkaline Phosphatase: 54 U/L (ref 39–117)
Bilirubin, Direct: 0 mg/dL (ref 0.0–0.3)
Total Bilirubin: 0.8 mg/dL (ref 0.3–1.2)
Total Protein: 7.6 g/dL (ref 6.0–8.3)

## 2013-06-07 LAB — LIPID PANEL
Total CHOL/HDL Ratio: 6
Triglycerides: 142 mg/dL (ref 0.0–149.0)

## 2013-06-07 LAB — LDL CHOLESTEROL, DIRECT: Direct LDL: 199.2 mg/dL

## 2013-06-07 MED ORDER — HYDROCHLOROTHIAZIDE 25 MG PO TABS: 25.0000 mg | ORAL_TABLET | Freq: Once | ORAL | Status: DC

## 2013-06-07 MED ORDER — LOSARTAN POTASSIUM 50 MG PO TABS: 50.0000 mg | ORAL_TABLET | Freq: Every day | ORAL | Status: DC

## 2013-06-07 NOTE — Assessment & Plan Note (Signed)
Check labs today.

## 2013-06-07 NOTE — Patient Instructions (Signed)

## 2013-06-07 NOTE — Progress Notes (Signed)
Pre visit review using our clinic review tool, if applicable. No additional management support is needed unless otherwise documented below in the visit note. 

## 2013-06-07 NOTE — Assessment & Plan Note (Signed)
Saline spray Or nasocort AQ

## 2013-06-07 NOTE — Progress Notes (Signed)
  Subjective:    Patient here for follow-up of elevated blood pressure.  He is exercising and is adherent to a low-salt diet.  Blood pressure is well controlled at home. Cardiac symptoms: none. Patient denies: chest pain, chest pressure/discomfort, claudication, dyspnea, exertional chest pressure/discomfort, fatigue, irregular heart beat, lower extremity edema, near-syncope, orthopnea, palpitations, paroxysmal nocturnal dyspnea, syncope and tachypnea. Cardiovascular risk factors: hypertension and male gender. Use of agents associated with hypertension: none. History of target organ damage: none.  The following portions of the patient's history were reviewed and updated as appropriate: allergies, current medications, past family history, past medical history, past social history, past surgical history and problem list.  Review of Systems Pertinent items are noted in HPI.     Objective:    BP 116/72  Pulse 88  Temp(Src) 96.4 F (35.8 C) (Tympanic)  Wt 123 lb (55.792 kg)  SpO2 98% General appearance: alert, cooperative, appears stated age and no distress Throat: lips, mucosa, and tongue normal; teeth and gums normal Neck: no adenopathy, no carotid bruit, no JVD, supple, symmetrical, trachea midline and thyroid not enlarged, symmetric, no tenderness/mass/nodules Lungs: clear to auscultation bilaterally Heart: S1, S2 normal Extremities: extremities normal, atraumatic, no cyanosis or edema    Assessment:    Hypertension, normal blood pressure . Evidence of target organ damage: none.    Plan:    Medication: no change. Regular aerobic exercise. Follow up: 3 months and as needed.

## 2013-06-10 ENCOUNTER — Encounter: Payer: BC Managed Care – PPO | Admitting: Internal Medicine

## 2013-06-12 MED ORDER — ATORVASTATIN CALCIUM 20 MG PO TABS: 20.0000 mg | ORAL_TABLET | Freq: Every day | ORAL | Status: DC

## 2013-08-04 ENCOUNTER — Other Ambulatory Visit: Payer: Self-pay | Admitting: Family Medicine

## 2013-09-10 ENCOUNTER — Encounter: Payer: Self-pay | Admitting: Family Medicine

## 2013-09-10 ENCOUNTER — Ambulatory Visit (INDEPENDENT_AMBULATORY_CARE_PROVIDER_SITE_OTHER): Payer: No Typology Code available for payment source | Admitting: Family Medicine

## 2013-09-10 VITALS — BP 116/70 | HR 65 | Temp 98.1°F | Wt 120.0 lb

## 2013-09-10 DIAGNOSIS — I10 Essential (primary) hypertension: Secondary | ICD-10-CM

## 2013-09-10 DIAGNOSIS — E785 Hyperlipidemia, unspecified: Secondary | ICD-10-CM

## 2013-09-10 DIAGNOSIS — N400 Enlarged prostate without lower urinary tract symptoms: Secondary | ICD-10-CM

## 2013-09-10 LAB — LIPID PANEL
CHOLESTEROL: 133 mg/dL (ref 0–200)
HDL: 43 mg/dL (ref >39.00)
LDL Cholesterol: 74 mg/dL (ref 0–99)
Total CHOL/HDL Ratio: 3
Triglycerides: 80 mg/dL (ref 0.0–149.0)
VLDL: 16 mg/dL (ref 0.0–40.0)

## 2013-09-10 LAB — BASIC METABOLIC PANEL
BUN: 20 mg/dL (ref 6–23)
CALCIUM: 9.1 mg/dL (ref 8.4–10.5)
CO2: 30 mEq/L (ref 19–32)
Chloride: 101 mEq/L (ref 96–112)
Creatinine, Ser: 1.1 mg/dL (ref 0.4–1.5)
GFR: 75.98 mL/min (ref >60.00)
GLUCOSE: 90 mg/dL (ref 70–99)
Potassium: 4.2 mEq/L (ref 3.5–5.1)
Sodium: 137 mEq/L (ref 135–145)

## 2013-09-10 LAB — HEPATIC FUNCTION PANEL
ALBUMIN: 4.1 g/dL (ref 3.5–5.2)
ALT: 40 U/L (ref 0–53)
AST: 30 U/L (ref 0–37)
Alkaline Phosphatase: 53 U/L (ref 39–117)
Bilirubin, Direct: 0.1 mg/dL (ref 0.0–0.3)
Total Bilirubin: 1.5 mg/dL — ABNORMAL HIGH (ref 0.3–1.2)
Total Protein: 7.2 g/dL (ref 6.0–8.3)

## 2013-09-10 MED ORDER — TADALAFIL 5 MG PO TABS: 5.0000 mg | ORAL_TABLET | Freq: Every day | ORAL | Status: DC | PRN

## 2013-09-10 MED ORDER — HYDROCHLOROTHIAZIDE 25 MG PO TABS
25.0000 mg | ORAL_TABLET | Freq: Once | ORAL | Status: DC
Start: 2013-09-10 — End: 2013-10-18

## 2013-09-10 MED ORDER — LOSARTAN POTASSIUM 50 MG PO TABS
50.0000 mg | ORAL_TABLET | Freq: Every day | ORAL | Status: DC
Start: 2013-09-10 — End: 2013-10-18

## 2013-09-10 NOTE — Progress Notes (Signed)
Pre-visit discussion using our clinic review tool. No additional management support is needed unless otherwise documented below in the visit note.  

## 2013-09-10 NOTE — Progress Notes (Signed)
  Subjective:    Patient here for follow-up of elevated blood pressure.  He is exercising and is adherent to a low-salt diet.  Blood pressure is well controlled at home. Cardiac symptoms: none. Patient denies: chest pain, chest pressure/discomfort, claudication, dyspnea, exertional chest pressure/discomfort, fatigue, irregular heart beat, lower extremity edema, near-syncope, orthopnea, palpitations, paroxysmal nocturnal dyspnea, syncope and tachypnea. Cardiovascular risk factors: advanced age (older than 8655 for men, 1965 for women), dyslipidemia, hypertension and male gender. Use of agents associated with hypertension: none. History of target organ damage: none.  The following portions of the patient's history were reviewed and updated as appropriate: allergies, current medications, past family history, past medical history, past social history, past surgical history and problem list.  Review of Systems Pertinent items are noted in HPI.     Objective:    BP 116/70  Pulse 65  Temp(Src) 98.1 F (36.7 C) (Oral)  Wt 120 lb (54.432 kg)  SpO2 98% General appearance: alert, cooperative, appears stated age and no distress Neck: no adenopathy, no carotid bruit, no JVD, supple, symmetrical, trachea midline and thyroid not enlarged, symmetric, no tenderness/mass/nodules Lungs: clear to auscultation bilaterally Heart: S1, S2 normal Extremities: extremities normal, atraumatic, no cyanosis or edema    Assessment:    Hypertension, normal blood pressure . Evidence of target organ damage: none.    Plan:    Medication: no change. Dietary sodium restriction. Regular aerobic exercise. Follow up: 6 months and as needed.

## 2013-09-10 NOTE — Assessment & Plan Note (Signed)
Check labs con't meds 

## 2013-09-10 NOTE — Patient Instructions (Signed)
T?ng Huy?t p (Hypertension) Khi tim ??p, n ??y mu qua cc ??ng m?ch. L?c ??y ny ???c g?i l huy?t p. N?u huy?t p qu cao, ng??i ta g?i ? l ch?ng t?ng huy?t p (HTN) hay cao huy?t p. Ch?ng t?ng huy?t p r?t nguy hi?m v b?n c th? m?c ph?i m khng hay bi?t g. Huy?t p cao c ngh?a l tim c?a b?n ph?i lm vi?c nhi?u h?n ?? b?m mu. Cc ??ng m?ch c th? b? h?p ho?c x? c?ng. T?ng cng cho tim lm b?n c nguy c? m?c b?nh tim, ??t qu?, v cc v?n ?? khc.  Huy?t p bao g?m hai con s?, s? cao h?n trn s? th?p h?n, v d?: 110/72. Huy?t p ???c ghi l "110 trn 72". L t??ng l d??i 120 cho s? trn (tm thu) v d??i 80 cho s? d??i (tm tr??ng). Ghi huy?t p c?a b?n ngy hm nay. B?n nn h?t s?c ch  ??n huy?t p c?a mnh n?u ?ang m?c ph?i nh?ng c?n b?nh nh?t ??ng nh? l:  Suy tim  Tr??c ?y b? nh?i mu c? tim  Ti?u ???ng  B?nh th?n m?n tnh  Tr??c ?y b? ??t qu?  Nhi?u nguy c? m?c b?nh tim. ?? xem c b? m?c ch?ng t?ng huy?t p hay khng, b?n nn ?o huy?t p khi ?ang ng?i v?i cnh tay ???c ??t ngang t?m tim c?a b?n. Nn ?o huy?t p t nh?t l hai l?n. ??c s? huy?t p cao m?t l?n (??c bi?t l ? [place]ho[place] C?p C?u) khng c ngh?a l b?n c?n ph?i ???c ?i?u tr?. C th? c cc c?n b?nh c huy?t p khc nhau gi?a tay tri v tay ph?i. ?i?u quan tr?ng l ph?i ?i khm s?m ?? chuyn gia ch?m sc s?c kh?e ki?m tra l?i. ?a s? m?i ng??i m?c ph?i ch?ng t?ng huy?t p nguyn pht, ?i?u ny c ngh?a l khng c nguyn nhn c? th?. C th? lm gi?m d?ng cao huy?t p ny b?ng cch thay ??i cc y?u t? v? l?i s?ng nh? l:  C?ng th?ng  Ht thu?c  Khng t?p th? d?c  Th?a cn  S? d?ng ma ty/thu?c l/r??u  Ch? ?? ?n t mu?i ?a s? ng??i ta khng c cc tri?u ch?ng do cao huy?t p cho ??n khi b?nh gy t?n h?i cho c? th?. Vi?c ?i?u tr? hi?u qu? th??ng c th? ng?n ch?n, tr hon hay gi?m m?c ?? t?n h?i ?. ?I?U TR? Khi ? xc ??nh ???c nguyn nhn, ?i?u tr? t?ng huy?t p s? nh?m vo nguyn nhn. C r?t nhi?u thu?c  ?i?u tr? ch?ng t?ng huy?t p. Cc thu?c ny c th? ???c chia ra thnh vi lo?i, v chuyn gia ch?m sc s?c kh?e s? gip b?n ch?n cc lo?i thu?c t?t nh?t cho b?n. Thu?c c th? c cc tc d?ng ph?. B?n v chuyn gia ch?m sc s?c kh?e nn xem l?i nh?ng tc d?ng ph? ?. N?u huy?t p v?n cao sau khi b?n ? thay ??i l?i s?ng ho?c b?t ??u dng thu?c th,  C th? c?n ph?i thay ??i (cc) thu?c.  C th? c?n ph?i gi?i quy?t ??n nh?ng v?n ?? khc.  Ph?i ch?c ch?n r?ng b?n hi?u toa thu?c, v bi?t r khi no dng thu?c v dng thu?c nh? th? no.  Ph?i ch?c ch?n r?ng b?n g?p chuyn gia ch?m sc s?c kh?e ?? ti khm trong khung th?i gian ? ???c khuy?n co (th??ng l trong vng hai tu?n) ?? ki?m tra l?i huy?t   p v xem l?i thu?c.  N?u dng nhi?u lo?i thu?c ?? h? huy?t p, ph?i ch?c ch?n r?ng b?n bi?t khi no nn dng thu?c v dng nh? th? no. Dng cng m?t lc hai lo?i thu?c c th? d?n ??n huy?t p h? qu th?p. ?I KHM NGAY L?P T?C N?U:  B?n b? nh?c ??u d? d?i, th? l?c thay ??i hay b? m?, ho?c l l?n.  B?n b? y?u ho?c t b?t th??ng, ho?c c c?m gic ng?t.  ?au b?ng hay ng?c tr?m tr?ng, i m?a, ho?c kh th?. HY CH?C CH?N R?NG B?N:  Hi?u nh?ng ch? d?n ny.  S? theo di tnh tr?ng c?a b?n.  S? nh?n ???c s? gip ?? ngay l?p t?c n?u b?n khng ?? ho?c tnh tr?ng tr?m tr?ng h?n. Document Released: 06/27/2005 Document Revised: 02/27/2013 [employer] Patient Information 2014 [employer].  

## 2013-09-11 ENCOUNTER — Telehealth: Payer: Self-pay | Admitting: Family Medicine

## 2013-09-11 NOTE — Telephone Encounter (Signed)
Relevant patient education mailed to patient.  

## 2013-09-14 ENCOUNTER — Other Ambulatory Visit: Payer: Self-pay | Admitting: Family Medicine

## 2013-10-18 ENCOUNTER — Other Ambulatory Visit: Payer: Self-pay

## 2013-10-18 DIAGNOSIS — I10 Essential (primary) hypertension: Secondary | ICD-10-CM

## 2013-10-18 MED ORDER — HYDROCHLOROTHIAZIDE 25 MG PO TABS: 25.0000 mg | ORAL_TABLET | Freq: Once | ORAL | Status: DC

## 2013-10-18 MED ORDER — LOSARTAN POTASSIUM 50 MG PO TABS: 50.0000 mg | ORAL_TABLET | Freq: Every day | ORAL | Status: DC

## 2013-10-22 ENCOUNTER — Other Ambulatory Visit: Payer: Self-pay

## 2013-10-22 DIAGNOSIS — I10 Essential (primary) hypertension: Secondary | ICD-10-CM

## 2013-10-22 MED ORDER — ATORVASTATIN CALCIUM 20 MG PO TABS: ORAL_TABLET | ORAL | Status: DC

## 2013-12-12 ENCOUNTER — Ambulatory Visit: Payer: No Typology Code available for payment source | Admitting: Family Medicine

## 2013-12-12 DIAGNOSIS — Z0289 Encounter for other administrative examinations: Secondary | ICD-10-CM

## 2014-02-28 ENCOUNTER — Ambulatory Visit (INDEPENDENT_AMBULATORY_CARE_PROVIDER_SITE_OTHER): Payer: No Typology Code available for payment source | Admitting: Family Medicine

## 2014-02-28 ENCOUNTER — Encounter: Payer: Self-pay | Admitting: Family Medicine

## 2014-02-28 VITALS — BP 166/90 | HR 71 | Temp 97.7°F | Wt 119.4 lb

## 2014-02-28 DIAGNOSIS — I10 Essential (primary) hypertension: Secondary | ICD-10-CM

## 2014-02-28 DIAGNOSIS — E785 Hyperlipidemia, unspecified: Secondary | ICD-10-CM

## 2014-02-28 MED ORDER — HYDROCHLOROTHIAZIDE 25 MG PO TABS: 25.0000 mg | ORAL_TABLET | Freq: Once | ORAL | Status: DC

## 2014-02-28 MED ORDER — LOSARTAN POTASSIUM 50 MG PO TABS: 50.0000 mg | ORAL_TABLET | Freq: Every day | ORAL | Status: DC

## 2014-02-28 NOTE — Patient Instructions (Signed)

## 2014-02-28 NOTE — Progress Notes (Signed)
Pre visit review using our clinic review tool, if applicable. No additional management support is needed unless otherwise documented below in the visit note. 

## 2014-02-28 NOTE — Progress Notes (Signed)
  Subjective:    Patient here for follow-up of elevated blood pressure.  He is not exercising and is not adherent to a low-salt diet.  Blood pressure is not well controlled at home. Cardiac symptoms: none. Patient denies: chest pain, chest pressure/discomfort, claudication, dyspnea, exertional chest pressure/discomfort, fatigue, irregular heart beat, lower extremity edema, near-syncope, orthopnea, palpitations, paroxysmal nocturnal dyspnea, syncope and tachypnea. Cardiovascular risk factors: advanced age (older than 3755 for men, 6165 for women), dyslipidemia, hypertension, male gender and sedentary lifestyle. Use of agents associated with hypertension: none. History of target organ damage: none. Pt stopped taking all his meds.  He wanted to try diet and exercise.    The following portions of the patient's history were reviewed and updated as appropriate: allergies, current medications, past family history, past medical history, past social history, past surgical history and problem list.  Review of Systems Pertinent items are noted in HPI.     Objective:    BP 166/90  Pulse 71  Temp(Src) 97.7 F (36.5 C) (Oral)  Wt 119 lb 6.4 oz (54.159 kg)  SpO2 97% General appearance: alert, cooperative, appears stated age and no distress Lungs: clear to auscultation bilaterally Heart: S1, S2 normal Extremities: extremities normal, atraumatic, no cyanosis or edema    Assessment:    Hypertension, elevated . Evidence of target organ damage: none.    Plan:    Medication: resume cozaar and hctz. Dietary sodium restriction. Regular aerobic exercise. Follow up: 3 months and as needed.  Discussed with pt through interpreter in room the importance of taking meds and risk of heart attack and stroke if he does not.  1. Essential hypertension Elevated today - losartan (COZAAR) 50 MG tablet; Take 1 tablet (50 mg total) by mouth daily.  Dispense: 90 tablet; Refill: 3 - hydrochlorothiazide (HYDRODIURIL) 25 MG  tablet; Take 1 tablet (25 mg total) by mouth once.  Dispense: 90 tablet; Refill: 3 - Basic metabolic panel  2. Other and unspecified hyperlipidemia Will check labs first before restarting meds - Basic metabolic panel - Hepatic function panel - Lipid panel

## 2014-03-01 LAB — HEPATIC FUNCTION PANEL
ALBUMIN: 4.3 g/dL (ref 3.5–5.2)
ALK PHOS: 51 U/L (ref 39–117)
ALT: 20 U/L (ref 0–53)
AST: 20 U/L (ref 0–37)
BILIRUBIN DIRECT: 0.1 mg/dL (ref 0.0–0.3)
TOTAL PROTEIN: 7.3 g/dL (ref 6.0–8.3)
Total Bilirubin: 1.7 mg/dL — ABNORMAL HIGH (ref 0.2–1.2)

## 2014-03-01 LAB — LIPID PANEL
Cholesterol: 206 mg/dL — ABNORMAL HIGH (ref 0–200)
HDL: 49.1 mg/dL (ref >39.00)
LDL Cholesterol: 136 mg/dL — ABNORMAL HIGH (ref 0–99)
NONHDL: 156.9
Total CHOL/HDL Ratio: 4
Triglycerides: 105 mg/dL (ref 0.0–149.0)
VLDL: 21 mg/dL (ref 0.0–40.0)

## 2014-03-01 LAB — BASIC METABOLIC PANEL
BUN: 17 mg/dL (ref 6–23)
CALCIUM: 9.3 mg/dL (ref 8.4–10.5)
CHLORIDE: 103 meq/L (ref 96–112)
CO2: 29 meq/L (ref 19–32)
Creatinine, Ser: 1 mg/dL (ref 0.4–1.5)
GFR: 84.13 mL/min (ref >60.00)
GLUCOSE: 87 mg/dL (ref 70–99)
Potassium: 3.5 mEq/L (ref 3.5–5.1)
Sodium: 140 mEq/L (ref 135–145)

## 2014-03-05 ENCOUNTER — Other Ambulatory Visit: Payer: Self-pay | Admitting: Family Medicine

## 2014-03-05 ENCOUNTER — Ambulatory Visit (INDEPENDENT_AMBULATORY_CARE_PROVIDER_SITE_OTHER): Payer: No Typology Code available for payment source

## 2014-03-05 DIAGNOSIS — Z23 Encounter for immunization: Secondary | ICD-10-CM

## 2014-03-05 DIAGNOSIS — I10 Essential (primary) hypertension: Secondary | ICD-10-CM

## 2014-03-05 MED ORDER — ATORVASTATIN CALCIUM 20 MG PO TABS: ORAL_TABLET | ORAL | Status: DC

## 2014-03-05 MED ORDER — HYDROCHLOROTHIAZIDE 25 MG PO TABS: 25.0000 mg | ORAL_TABLET | Freq: Once | ORAL | Status: DC

## 2014-03-05 MED ORDER — LOSARTAN POTASSIUM 50 MG PO TABS: 50.0000 mg | ORAL_TABLET | Freq: Every day | ORAL | Status: DC

## 2014-06-02 ENCOUNTER — Ambulatory Visit: Payer: No Typology Code available for payment source | Admitting: Family Medicine

## 2014-07-08 ENCOUNTER — Ambulatory Visit (INDEPENDENT_AMBULATORY_CARE_PROVIDER_SITE_OTHER): Payer: No Typology Code available for payment source | Admitting: Family Medicine

## 2014-07-08 ENCOUNTER — Encounter: Payer: Self-pay | Admitting: Family Medicine

## 2014-07-08 VITALS — BP 120/62 | HR 59 | Temp 98.0°F | Wt 120.2 lb

## 2014-07-08 DIAGNOSIS — E785 Hyperlipidemia, unspecified: Secondary | ICD-10-CM

## 2014-07-08 DIAGNOSIS — I10 Essential (primary) hypertension: Secondary | ICD-10-CM

## 2014-07-08 LAB — LIPID PANEL
Cholesterol: 187 mg/dL (ref 0–200)
HDL: 39.5 mg/dL (ref >39.00)
LDL Cholesterol: 112 mg/dL — ABNORMAL HIGH (ref 0–99)
NONHDL: 147.5
Total CHOL/HDL Ratio: 5
Triglycerides: 179 mg/dL — ABNORMAL HIGH (ref 0.0–149.0)
VLDL: 35.8 mg/dL (ref 0.0–40.0)

## 2014-07-08 LAB — HEPATIC FUNCTION PANEL
ALBUMIN: 4.3 g/dL (ref 3.5–5.2)
ALT: 28 U/L (ref 0–53)
AST: 24 U/L (ref 0–37)
Alkaline Phosphatase: 56 U/L (ref 39–117)
Bilirubin, Direct: 0.1 mg/dL (ref 0.0–0.3)
Total Bilirubin: 1.3 mg/dL — ABNORMAL HIGH (ref 0.2–1.2)
Total Protein: 7.3 g/dL (ref 6.0–8.3)

## 2014-07-08 LAB — BASIC METABOLIC PANEL
BUN: 22 mg/dL (ref 6–23)
CO2: 31 mEq/L (ref 19–32)
CREATININE: 1 mg/dL (ref 0.4–1.5)
Calcium: 9.2 mg/dL (ref 8.4–10.5)
Chloride: 103 mEq/L (ref 96–112)
GFR: 80.17 mL/min (ref >60.00)
Glucose, Bld: 97 mg/dL (ref 70–99)
Potassium: 4 mEq/L (ref 3.5–5.1)
Sodium: 139 mEq/L (ref 135–145)

## 2014-07-08 MED ORDER — LOSARTAN POTASSIUM 50 MG PO TABS: 50.0000 mg | ORAL_TABLET | Freq: Every day | ORAL | Status: DC

## 2014-07-08 MED ORDER — HYDROCHLOROTHIAZIDE 25 MG PO TABS: 25.0000 mg | ORAL_TABLET | Freq: Once | ORAL | Status: DC

## 2014-07-08 NOTE — Addendum Note (Signed)
Addended by: Lelon PerlaLOWNE, YVONNE R on: 07/08/2014 09:06 AM   Modules accepted: Orders

## 2014-07-08 NOTE — Progress Notes (Signed)
  Subjective:    Patient here for follow-up of elevated blood pressure.  He is not exercising and is adherent to a low-salt diet.  Blood pressure is well controlled at home. Cardiac symptoms: none. Patient denies: chest pain, chest pressure/discomfort, claudication, dyspnea, exertional chest pressure/discomfort, fatigue, irregular heart beat, lower extremity edema, near-syncope, orthopnea, palpitations, paroxysmal nocturnal dyspnea, syncope and tachypnea. Cardiovascular risk factors: advanced age (older than 1755 for men, 6065 for women), dyslipidemia, hypertension and male gender. Use of agents associated with hypertension: none. History of target organ damage: none.  The following portions of the patient's history were reviewed and updated as appropriate: He  has a past medical history of Hypertension. He  does not have any pertinent problems on file. He  has past surgical history that includes Hernia repair. His family history includes Hypertension in his mother; Liver cancer in his father and another family member. There is no history of Colon cancer, Pancreatic cancer, or Stomach cancer. He  reports that he quit smoking about 9 years ago. His smoking use included Cigarettes. He smoked 0.50 packs per day. He does not have any smokeless tobacco history on file. He reports that he drinks alcohol. He reports that he does not use illicit drugs. He has a current medication list which includes the following prescription(s): atorvastatin, fish oil-omega-3 fatty acids, hydrochlorothiazide, losartan, multivitamin, and tadalafil..  Review of Systems Constitutional: negative for fatigue, night sweats, sweats and weight loss Respiratory: negative for asthma, chronic bronchitis, cough, dyspnea on exertion, emphysema, pleurisy/chest pain, stridor and wheezing Cardiovascular: negative for chest pain, chest pressure/discomfort, claudication, dyspnea, exertional chest pressure/discomfort, fatigue, irregular heart beat,  lower extremity edema, near-syncope, orthopnea, palpitations, paroxysmal nocturnal dyspnea, syncope and tachypnea     Objective:    BP 120/62 mmHg  Pulse 59  Temp(Src) 98 F (36.7 C) (Oral)  Wt 120 lb 3.2 oz (54.522 kg)  SpO2 97% General appearance: alert, cooperative, appears stated age and no distress Nose: Nares normal. Septum midline. Mucosa normal. No drainage or sinus tenderness. Throat: lips, mucosa, and tongue normal; teeth and gums normal Neck: no adenopathy, supple, symmetrical, trachea midline and thyroid not enlarged, symmetric, no tenderness/mass/nodules Lungs: clear to auscultation bilaterally Heart: S1, S2 normal Extremities: extremities normal, atraumatic, no cyanosis or edema    Assessment:    Hypertension, normal blood pressure . Evidence of target organ damage: none.    Plan:    Medication: no change. Dietary sodium restriction. Regular aerobic exercise. Check blood pressures 2-3 times weekly and record. Follow up: 6 months and as needed.    1. Essential hypertension   - Hepatic function panel - Lipid panel - losartan (COZAAR) 50 MG tablet; Take 1 tablet (50 mg total) by mouth daily.  Dispense: 90 tablet; Refill: 3 - hydrochlorothiazide (HYDRODIURIL) 25 MG tablet; Take 1 tablet (25 mg total) by mouth once.  Dispense: 90 tablet; Refill: 3  2. Hyperlipidemia con't lipitor, check labs - Basic metabolic panel

## 2014-07-08 NOTE — Progress Notes (Signed)
Pre visit review using our clinic review tool, if applicable. No additional management support is needed unless otherwise documented below in the visit note. 

## 2014-07-08 NOTE — Patient Instructions (Signed)

## 2014-08-17 IMAGING — CR DG CHEST 2V
2 series · 2 of 2 positions shown · non-contrast
Comparison: None.

CLINICAL DATA: Heart palpitations

CHEST - 2 VIEW

[w chest pa]
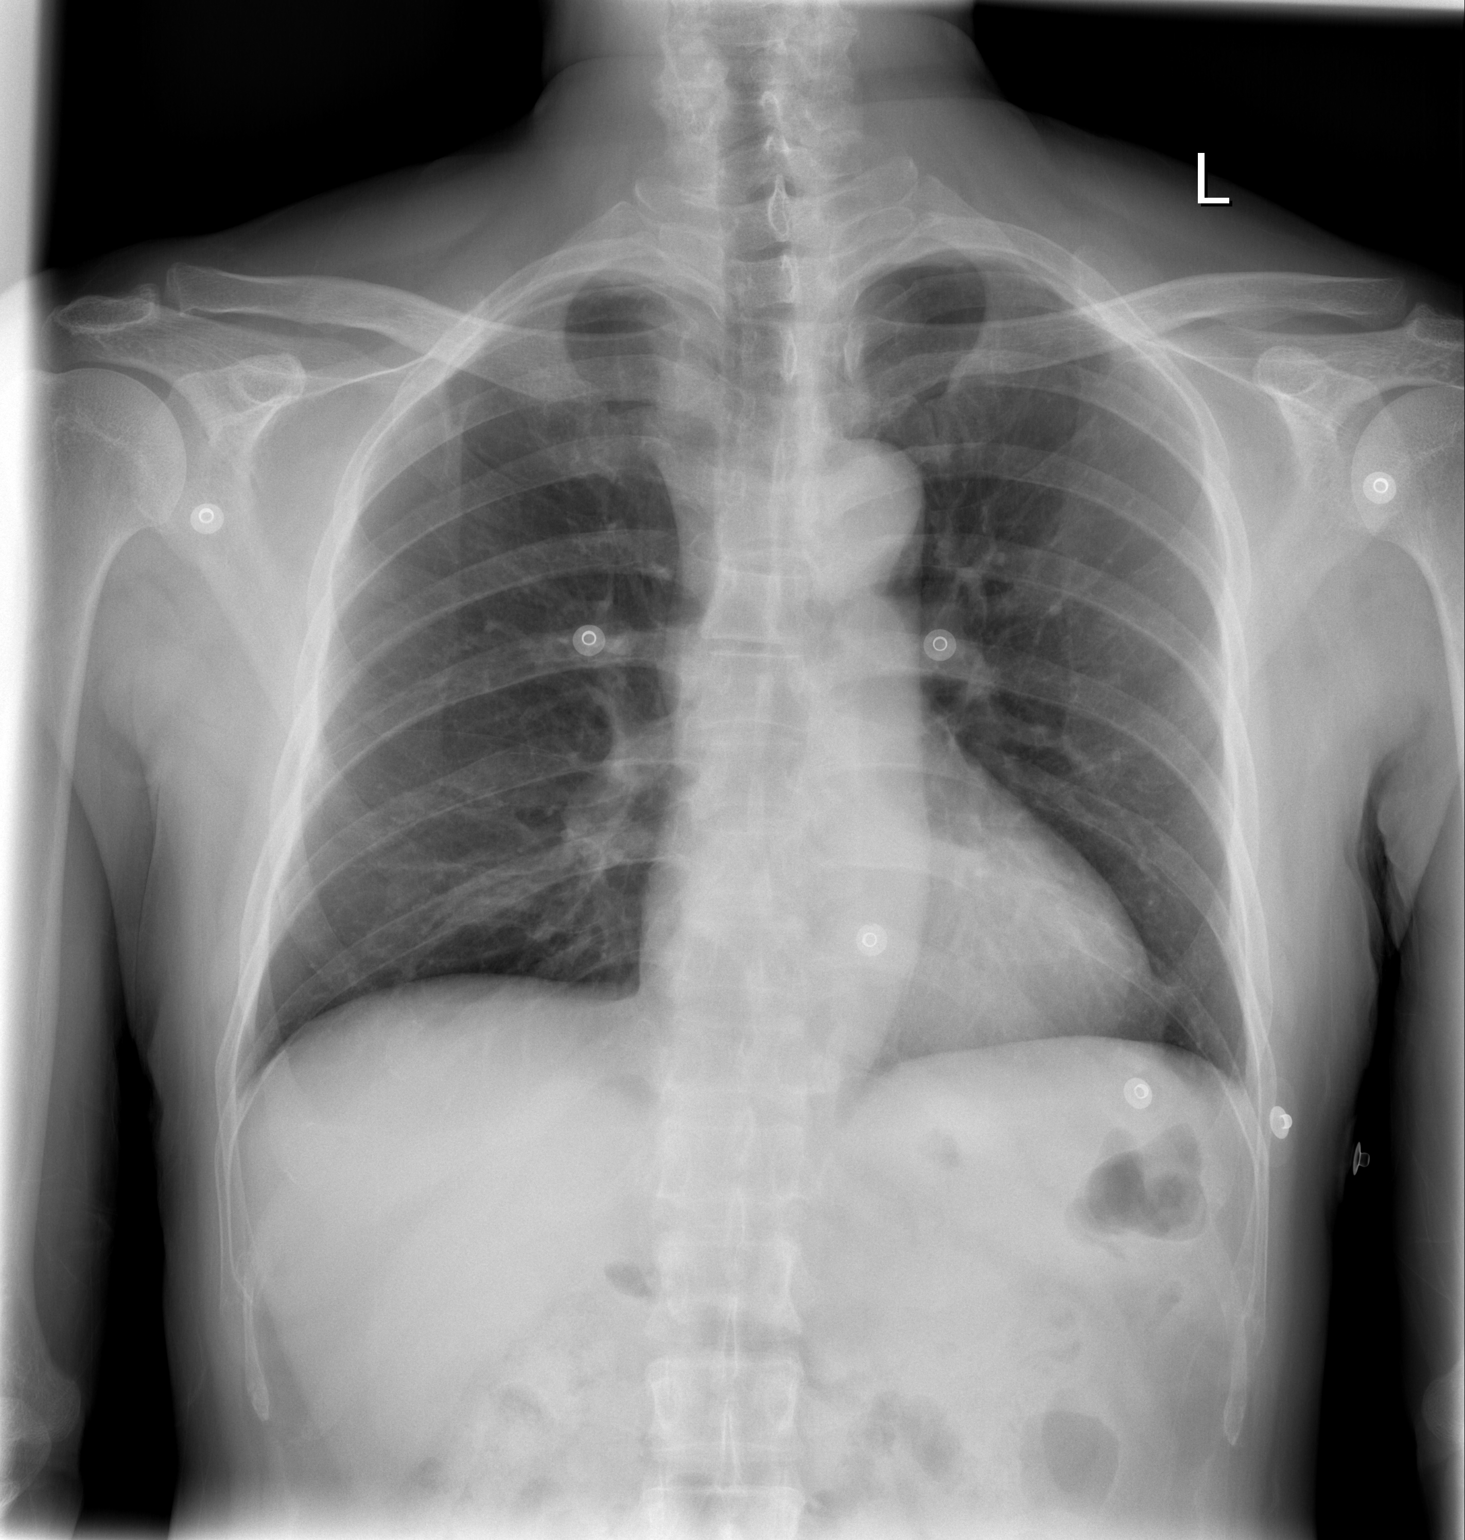

[w chest lat]
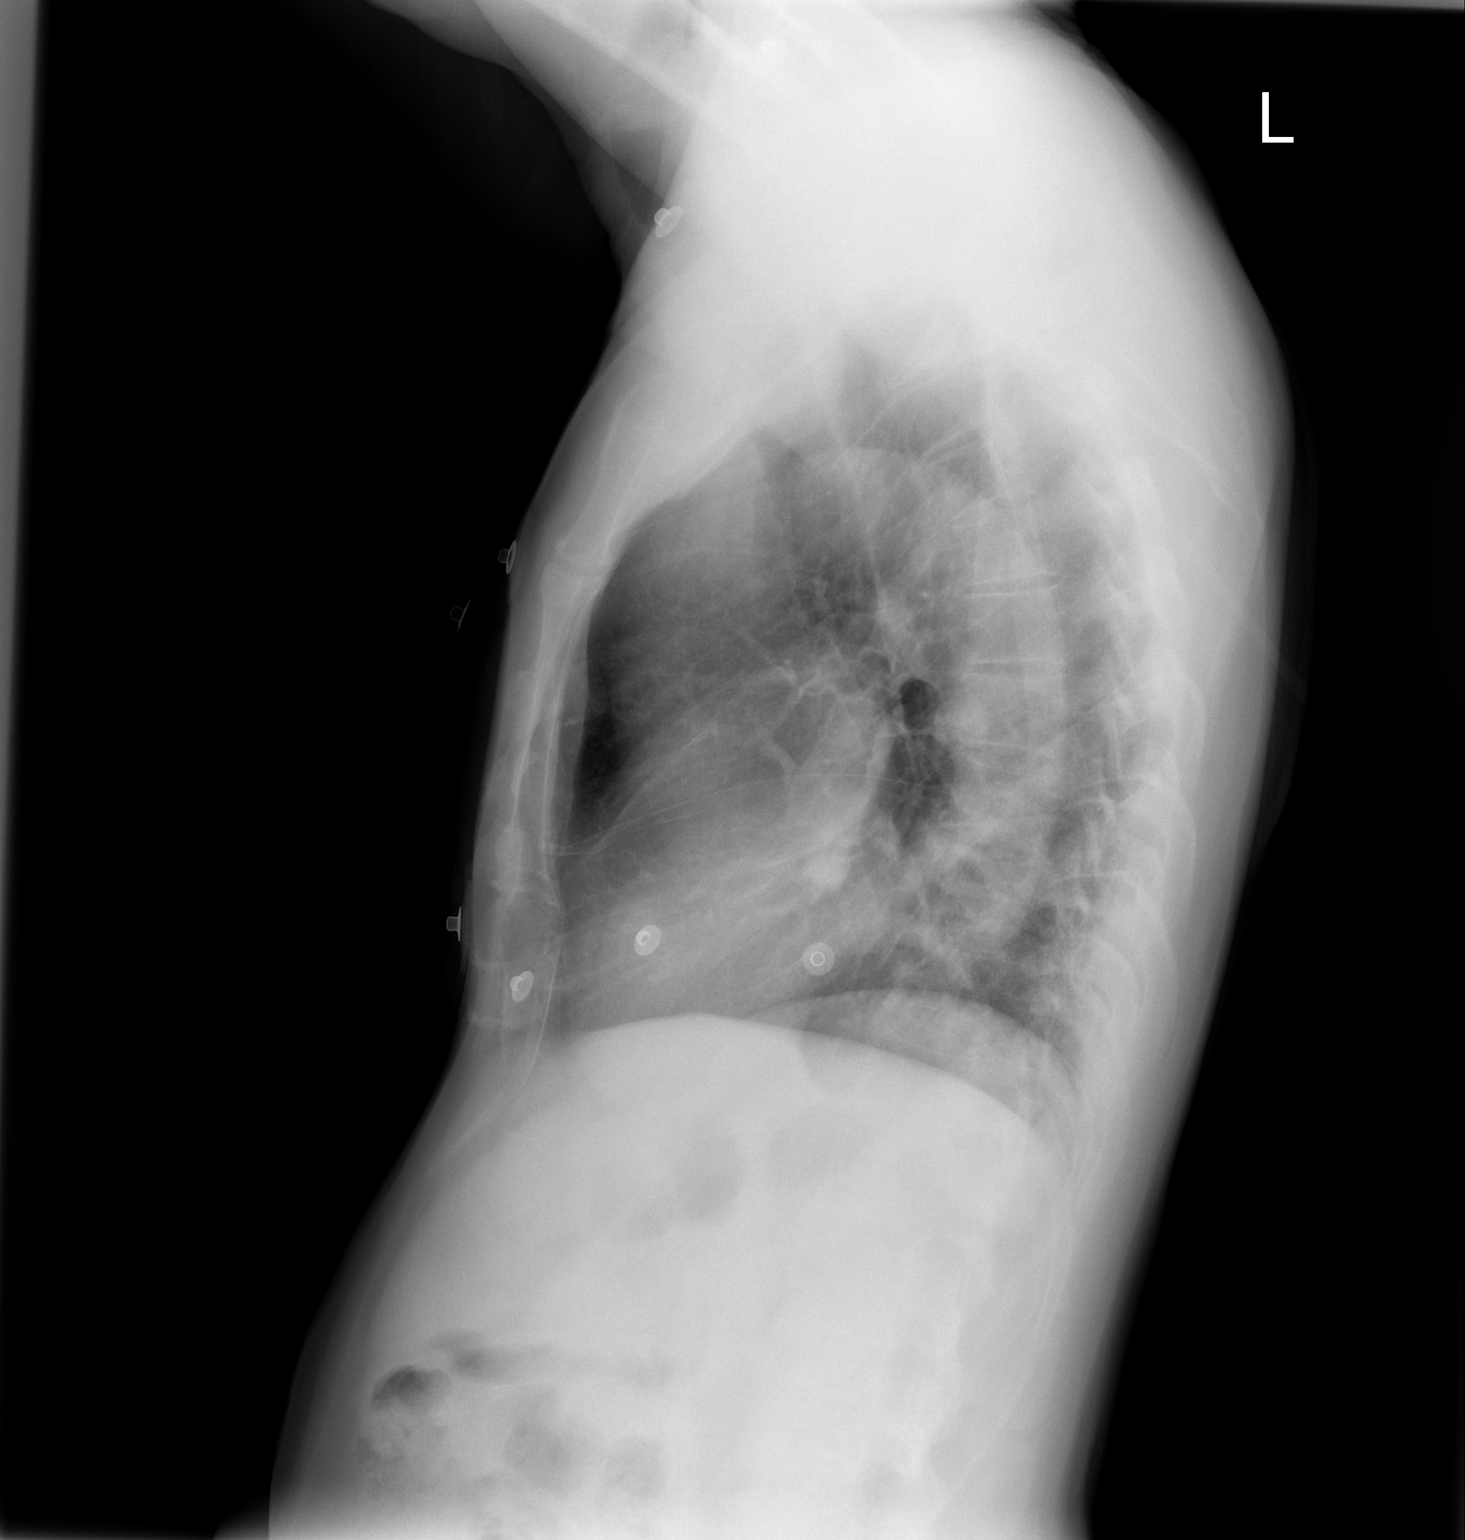

[2 of 2 positions shown; findings below may reference images not displayed]

FINDINGS: Normal heart, mediastinal, hilar contours.  The lungs are
normally expanded and clear. No acute bony abnormality.
IMPRESSION: No acute cardiopulmonary disease.

## 2015-01-09 ENCOUNTER — Ambulatory Visit: Payer: Self-pay | Admitting: Family Medicine

## 2015-01-15 ENCOUNTER — Telehealth: Payer: Self-pay | Admitting: Family Medicine

## 2015-01-15 NOTE — Telephone Encounter (Signed)
Pt was no show 01/09/15 8:15am, follow up 15 appt, pt called in 01/13/15 and rescheduled for 02/10/15, charge?

## 2015-01-15 NOTE — Telephone Encounter (Signed)
no

## 2015-02-10 ENCOUNTER — Encounter: Payer: Self-pay | Admitting: Family Medicine

## 2015-02-10 ENCOUNTER — Ambulatory Visit (INDEPENDENT_AMBULATORY_CARE_PROVIDER_SITE_OTHER): Payer: 59 | Admitting: Family Medicine

## 2015-02-10 VITALS — BP 120/82 | HR 58 | Temp 98.4°F | Ht 60.0 in | Wt 118.6 lb

## 2015-02-10 DIAGNOSIS — K088 Other specified disorders of teeth and supporting structures: Secondary | ICD-10-CM

## 2015-02-10 DIAGNOSIS — E785 Hyperlipidemia, unspecified: Secondary | ICD-10-CM

## 2015-02-10 DIAGNOSIS — I10 Essential (primary) hypertension: Secondary | ICD-10-CM

## 2015-02-10 DIAGNOSIS — K047 Periapical abscess without sinus: Secondary | ICD-10-CM

## 2015-02-10 DIAGNOSIS — K0889 Other specified disorders of teeth and supporting structures: Secondary | ICD-10-CM | POA: Insufficient documentation

## 2015-02-10 DIAGNOSIS — Z87438 Personal history of other diseases of male genital organs: Secondary | ICD-10-CM

## 2015-02-10 LAB — HEPATIC FUNCTION PANEL
ALK PHOS: 47 U/L (ref 39–117)
ALT: 22 U/L (ref 0–53)
AST: 19 U/L (ref 0–37)
Albumin: 4.4 g/dL (ref 3.5–5.2)
BILIRUBIN DIRECT: 0.2 mg/dL (ref 0.0–0.3)
Total Bilirubin: 1.2 mg/dL (ref 0.2–1.2)
Total Protein: 7.4 g/dL (ref 6.0–8.3)

## 2015-02-10 LAB — LIPID PANEL
Cholesterol: 180 mg/dL (ref 0–200)
HDL: 40.9 mg/dL (ref >39.00)
LDL CALC: 116 mg/dL — AB (ref 0–99)
NONHDL: 138.76
Total CHOL/HDL Ratio: 4
Triglycerides: 114 mg/dL (ref 0.0–149.0)
VLDL: 22.8 mg/dL (ref 0.0–40.0)

## 2015-02-10 LAB — BASIC METABOLIC PANEL
BUN: 29 mg/dL — AB (ref 6–23)
CO2: 31 mEq/L (ref 19–32)
CREATININE: 1.01 mg/dL (ref 0.40–1.50)
Calcium: 9.4 mg/dL (ref 8.4–10.5)
Chloride: 102 mEq/L (ref 96–112)
GFR: 79.1 mL/min (ref >60.00)
GLUCOSE: 96 mg/dL (ref 70–99)
POTASSIUM: 3.8 meq/L (ref 3.5–5.1)
Sodium: 138 mEq/L (ref 135–145)

## 2015-02-10 MED ORDER — LOSARTAN POTASSIUM 50 MG PO TABS: 50.0000 mg | ORAL_TABLET | Freq: Every day | ORAL | Status: DC

## 2015-02-10 MED ORDER — AMOXICILLIN-POT CLAVULANATE 875-125 MG PO TABS
1.0000 | ORAL_TABLET | Freq: Two times a day (BID) | ORAL | Status: DC
Start: 2015-02-10 — End: 2015-08-27

## 2015-02-10 MED ORDER — TRAMADOL HCL 50 MG PO TABS
50.0000 mg | ORAL_TABLET | Freq: Three times a day (TID) | ORAL | Status: DC | PRN
Start: 2015-02-10 — End: 2017-09-04

## 2015-02-10 MED ORDER — FINASTERIDE 5 MG PO TABS: 5.0000 mg | ORAL_TABLET | Freq: Every day | ORAL | Status: DC

## 2015-02-10 MED ORDER — HYDROCHLOROTHIAZIDE 25 MG PO TABS: 25.0000 mg | ORAL_TABLET | Freq: Once | ORAL | Status: DC

## 2015-02-10 MED ORDER — ATORVASTATIN CALCIUM 20 MG PO TABS: ORAL_TABLET | ORAL | Status: DC

## 2015-02-10 NOTE — Assessment & Plan Note (Signed)
Check labs con't lipitor  

## 2015-02-10 NOTE — Assessment & Plan Note (Signed)
Stable con't cozaar and hctz rto 6 months

## 2015-02-10 NOTE — Assessment & Plan Note (Signed)
abx and pain med sent to pharmacy Pt instructed to see a dentist--- informed pt about dental school and [facility]

## 2015-02-10 NOTE — Patient Instructions (Signed)

## 2015-02-10 NOTE — Progress Notes (Signed)
Pre visit review using our clinic review tool, if applicable. No additional management support is needed unless otherwise documented below in the visit note. 

## 2015-02-10 NOTE — Progress Notes (Signed)
Patient ID: Lance Oneal, male    DOB: 02-Dec-1950  Age: 64 y.o. MRN: [id number]    Subjective:  Subjective HPI Lance Oneal presents for f/u bp and cholesterol.  He also needs refills of meds , including proscar.  He will see urology in Nov.   He is also c/o tooth pain on low R side.  It is sensitive to cold.  He does not have a dentist.    Review of Systems  Constitutional: Negative for diaphoresis, appetite change, fatigue and unexpected weight change.  HENT: Positive for dental problem. Negative for ear discharge, ear pain and facial swelling.   Eyes: Negative for pain, redness and visual disturbance.  Respiratory: Negative for cough, chest tightness, shortness of breath and wheezing.   Cardiovascular: Negative for chest pain, palpitations and leg swelling.  Endocrine: Negative for cold intolerance, heat intolerance, polydipsia, polyphagia and polyuria.  Genitourinary: Negative for dysuria, frequency and difficulty urinating.  Neurological: Negative for dizziness, light-headedness, numbness and headaches.    History Past Medical History  Diagnosis Date  . Hypertension     He has past surgical history that includes Hernia repair.   His family history includes Hypertension in his mother; Liver cancer in his father and another family member. There is no history of Colon cancer, Pancreatic cancer, or Stomach cancer.He reports that he quit smoking about 9 years ago. His smoking use included Cigarettes. He smoked 0.50 packs per day. He does not have any smokeless tobacco history on file. He reports that he drinks alcohol. He reports that he does not use illicit drugs.  Current Outpatient Prescriptions on File Prior to Visit  Medication Sig Dispense Refill  . fish oil-omega-3 fatty acids 1000 MG capsule Take 1 g by mouth daily.    . Multiple Vitamin (MULTIVITAMIN) tablet Take 1 tablet by mouth daily. Protandem multivitamin    . tadalafil (CIALIS) 5 MG tablet Take 1 tablet (5 mg total) by mouth  daily as needed for erectile dysfunction. 30 tablet 5   No current facility-administered medications on file prior to visit.     Objective:  Objective Physical Exam  Constitutional: He is oriented to person, place, and time. Vital signs are normal. He appears well-developed and well-nourished. He is sleeping.  HENT:  Head: Normocephalic and atraumatic.  Mouth/Throat: Oropharynx is clear and moist.    Eyes: EOM are normal. Pupils are equal, round, and reactive to light.  Neck: Normal range of motion. Neck supple. No thyromegaly present.  Cardiovascular: Normal rate and regular rhythm.   No murmur heard. Pulmonary/Chest: Effort normal and breath sounds normal. No respiratory distress. He has no wheezes. He has no rales. He exhibits no tenderness.  Musculoskeletal: He exhibits no edema or tenderness.  Neurological: He is alert and oriented to person, place, and time.  Skin: Skin is warm and dry.  Psychiatric: He has a normal mood and affect. His behavior is normal. Judgment and thought content normal.   BP 120/82 mmHg  Pulse 58  Temp(Src) 98.4 F (36.9 C) (Oral)  Ht 5' (1.524 m)  Wt 118 lb 9.6 oz (53.797 kg)  BMI 23.16 kg/m2  SpO2 99% Wt Readings from Last 3 Encounters:  02/10/15 118 lb 9.6 oz (53.797 kg)  07/08/14 120 lb 3.2 oz (54.522 kg)  02/28/14 119 lb 6.4 oz (54.159 kg)     Lab Results  Component Value Date   WBC 7.1 02/26/2013   HGB 13.2 02/26/2013   HCT 39.3 02/26/2013   PLT 319.0 02/26/2013  GLUCOSE 97 07/08/2014   CHOL 187 07/08/2014   TRIG 179.0* 07/08/2014   HDL 39.50 07/08/2014   LDLDIRECT 199.2 06/07/2013   LDLCALC 112* 07/08/2014   ALT 28 07/08/2014   AST 24 07/08/2014   NA 139 07/08/2014   K 4.0 07/08/2014   CL 103 07/08/2014   CREATININE 1.0 07/08/2014   BUN 22 07/08/2014   CO2 31 07/08/2014   TSH 0.72 02/26/2013   PSA 1.46 02/26/2013    Dg Chest 2 View  11/25/2012   *RADIOLOGY REPORT*  Clinical Data: Heart palpitations  CHEST - 2 VIEW   Comparison: None.  Findings: Normal heart, mediastinal, hilar contours.  The lungs are normally expanded and clear. No acute bony abnormality.  IMPRESSION: No acute cardiopulmonary disease.   Original Report Authenticated By: Britta Mccreedy, M.D.     Assessment & Plan:  Plan I have changed Lance Oneal finasteride and finasteride. I am also having him start on amoxicillin-clavulanate and traMADol. Additionally, I am having him maintain his fish oil-omega-3 fatty acids, multivitamin, tadalafil, atorvastatin, hydrochlorothiazide, and losartan.  Meds ordered this encounter  Medications  . DISCONTD: finasteride (PROSCAR) 5 MG tablet    Sig: Take 5 mg by mouth daily.    Refill:  3  . atorvastatin (LIPITOR) 20 MG tablet    Sig: TAKE 1 TABLET BY MOUTH AT BEDTIME    Dispense:  90 tablet    Refill:  1    D/C PREVIOUS SCRIPTS FOR THIS MEDICATION  . hydrochlorothiazide (HYDRODIURIL) 25 MG tablet    Sig: Take 1 tablet (25 mg total) by mouth once.    Dispense:  90 tablet    Refill:  1    D/C PREVIOUS SCRIPTS FOR THIS MEDICATION  . losartan (COZAAR) 50 MG tablet    Sig: Take 1 tablet (50 mg total) by mouth daily.    Dispense:  90 tablet    Refill:  1    D/C PREVIOUS SCRIPTS FOR THIS MEDICATION  . finasteride (PROSCAR) 5 MG tablet    Sig: Take 1 tablet (5 mg total) by mouth daily.    Dispense:  90 tablet    Refill:  1  . finasteride (PROSCAR) 5 MG tablet    Sig: Take 1 tablet (5 mg total) by mouth daily.    Dispense:  90 tablet    Refill:  1  . amoxicillin-clavulanate (AUGMENTIN) 875-125 MG per tablet    Sig: Take 1 tablet by mouth 2 (two) times daily.    Dispense:  20 tablet    Refill:  0  . traMADol (ULTRAM) 50 MG tablet    Sig: Take 1 tablet (50 mg total) by mouth every 8 (eight) hours as needed.    Dispense:  30 tablet    Refill:  0    Problem List Items Addressed This Visit    Tooth pain    abx and pain med sent to pharmacy Pt instructed to see a dentist--- informed pt about dental  school and [employer]      Hyperlipidemia    Check labs con't lipitor       Relevant Medications   atorvastatin (LIPITOR) 20 MG tablet   hydrochlorothiazide (HYDRODIURIL) 25 MG tablet   losartan (COZAAR) 50 MG tablet   Other Relevant Orders   Hepatic function panel   Lipid panel   HTN (hypertension) - Primary    Stable con't cozaar and hctz rto 6 months      Relevant Medications   atorvastatin (LIPITOR) 20 MG  tablet   hydrochlorothiazide (HYDRODIURIL) 25 MG tablet   losartan (COZAAR) 50 MG tablet   Other Relevant Orders   Basic metabolic panel   History of BPH   Relevant Medications   finasteride (PROSCAR) 5 MG tablet   finasteride (PROSCAR) 5 MG tablet    Other Visit Diagnoses    Tooth abscess        Relevant Medications    amoxicillin-clavulanate (AUGMENTIN) 875-125 MG per tablet    traMADol (ULTRAM) 50 MG tablet       Follow-up: Return in about 6 months (around 08/13/2015) for hypertension, hyperlipidemia, annual exam, fasting.  Loreen Freud, DO

## 2015-08-27 ENCOUNTER — Encounter: Payer: Self-pay | Admitting: Family Medicine

## 2015-08-27 ENCOUNTER — Ambulatory Visit (INDEPENDENT_AMBULATORY_CARE_PROVIDER_SITE_OTHER): Payer: BLUE CROSS/BLUE SHIELD | Admitting: Family Medicine

## 2015-08-27 VITALS — BP 133/75 | HR 76 | Temp 97.8°F | Ht 60.0 in | Wt 123.2 lb

## 2015-08-27 DIAGNOSIS — E785 Hyperlipidemia, unspecified: Secondary | ICD-10-CM | POA: Diagnosis not present

## 2015-08-27 DIAGNOSIS — R350 Frequency of micturition: Secondary | ICD-10-CM | POA: Diagnosis not present

## 2015-08-27 DIAGNOSIS — I10 Essential (primary) hypertension: Secondary | ICD-10-CM

## 2015-08-27 DIAGNOSIS — Z1159 Encounter for screening for other viral diseases: Secondary | ICD-10-CM

## 2015-08-27 DIAGNOSIS — Z0000 Encounter for general adult medical examination without abnormal findings: Secondary | ICD-10-CM | POA: Diagnosis not present

## 2015-08-27 DIAGNOSIS — Z87438 Personal history of other diseases of male genital organs: Secondary | ICD-10-CM | POA: Diagnosis not present

## 2015-08-27 DIAGNOSIS — R319 Hematuria, unspecified: Secondary | ICD-10-CM

## 2015-08-27 LAB — COMPREHENSIVE METABOLIC PANEL
ALT: 19 U/L (ref 0–53)
AST: 22 U/L (ref 0–37)
Albumin: 4.4 g/dL (ref 3.5–5.2)
Alkaline Phosphatase: 56 U/L (ref 39–117)
BILIRUBIN TOTAL: 1.2 mg/dL (ref 0.2–1.2)
BUN: 18 mg/dL (ref 6–23)
CHLORIDE: 104 meq/L (ref 96–112)
CO2: 32 meq/L (ref 19–32)
Calcium: 9.4 mg/dL (ref 8.4–10.5)
Creatinine, Ser: 0.92 mg/dL (ref 0.40–1.50)
GFR: 87.94 mL/min (ref >60.00)
GLUCOSE: 86 mg/dL (ref 70–99)
Potassium: 4.1 mEq/L (ref 3.5–5.1)
Sodium: 141 mEq/L (ref 135–145)
Total Protein: 7.1 g/dL (ref 6.0–8.3)

## 2015-08-27 LAB — LIPID PANEL
CHOL/HDL RATIO: 4
Cholesterol: 176 mg/dL (ref 0–200)
HDL: 46.4 mg/dL (ref >39.00)
LDL Cholesterol: 112 mg/dL — ABNORMAL HIGH (ref 0–99)
NONHDL: 130.02
Triglycerides: 91 mg/dL (ref 0.0–149.0)
VLDL: 18.2 mg/dL (ref 0.0–40.0)

## 2015-08-27 LAB — PSA: PSA: 0.95 ng/mL (ref 0.10–4.00)

## 2015-08-27 LAB — CBC WITH DIFFERENTIAL/PLATELET
BASOS PCT: 0.5 % (ref 0.0–3.0)
Basophils Absolute: 0 10*3/uL (ref 0.0–0.1)
EOS PCT: 2.5 % (ref 0.0–5.0)
Eosinophils Absolute: 0.2 10*3/uL (ref 0.0–0.7)
HEMATOCRIT: 40.3 % (ref 39.0–52.0)
Hemoglobin: 13.6 g/dL (ref 13.0–17.0)
LYMPHS ABS: 1.9 10*3/uL (ref 0.7–4.0)
LYMPHS PCT: 21.6 % (ref 12.0–46.0)
MCHC: 33.7 g/dL (ref 30.0–36.0)
MCV: 87.6 fl (ref 78.0–100.0)
MONOS PCT: 6.8 % (ref 3.0–12.0)
Monocytes Absolute: 0.6 10*3/uL (ref 0.1–1.0)
NEUTROS ABS: 6 10*3/uL (ref 1.4–7.7)
NEUTROS PCT: 68.6 % (ref 43.0–77.0)
PLATELETS: 326 10*3/uL (ref 150.0–400.0)
RBC: 4.61 Mil/uL (ref 4.22–5.81)
RDW: 13.5 % (ref 11.5–15.5)
WBC: 8.7 10*3/uL (ref 4.0–10.5)

## 2015-08-27 LAB — POCT URINALYSIS DIPSTICK
BILIRUBIN UA: NEGATIVE
Glucose, UA: NEGATIVE
Ketones, UA: NEGATIVE
LEUKOCYTES UA: NEGATIVE
NITRITE UA: NEGATIVE
PH UA: 6
PROTEIN UA: NEGATIVE
Spec Grav, UA: 1.025
Urobilinogen, UA: 0.2

## 2015-08-27 LAB — TSH: TSH: 0.54 u[IU]/mL (ref 0.35–4.50)

## 2015-08-27 MED ORDER — HYDROCHLOROTHIAZIDE 25 MG PO TABS: 25.0000 mg | ORAL_TABLET | Freq: Once | ORAL | Status: DC

## 2015-08-27 MED ORDER — LOSARTAN POTASSIUM 50 MG PO TABS: 50.0000 mg | ORAL_TABLET | Freq: Every day | ORAL | Status: DC

## 2015-08-27 MED ORDER — ATORVASTATIN CALCIUM 20 MG PO TABS: ORAL_TABLET | ORAL | Status: DC

## 2015-08-27 MED ORDER — FINASTERIDE 5 MG PO TABS: 5.0000 mg | ORAL_TABLET | Freq: Every day | ORAL | Status: DC

## 2015-08-27 NOTE — Patient Instructions (Signed)

## 2015-08-27 NOTE — Progress Notes (Signed)
Pre visit review using our clinic review tool, if applicable. No additional management support is needed unless otherwise documented below in the visit note. 

## 2015-08-28 LAB — HEPATITIS C ANTIBODY: HCV Ab: NEGATIVE

## 2015-08-28 LAB — URINE CULTURE
Colony Count: NO GROWTH
ORGANISM ID, BACTERIA: NO GROWTH

## 2015-08-30 DIAGNOSIS — Z0000 Encounter for general adult medical examination without abnormal findings: Secondary | ICD-10-CM | POA: Insufficient documentation

## 2015-08-30 NOTE — Assessment & Plan Note (Signed)
con't lipitor Check labs 

## 2015-08-30 NOTE — Progress Notes (Signed)
Patient ID: Lance Oneal, male    DOB: [birth year]  Age: 65 y.o. MRN: [id number]    Subjective:  Subjective HPI Lance Oneal presents for cpe.  No complaints except he had trouble getting through to the office and has been without medication.    Review of Systems  Constitutional: Negative.  Negative for diaphoresis, appetite change, fatigue and unexpected weight change.  HENT: Negative for congestion, ear pain, hearing loss, nosebleeds, postnasal drip, rhinorrhea, sinus pressure, sneezing and tinnitus.   Eyes: Negative for photophobia, pain, discharge, redness, itching and visual disturbance.  Respiratory: Negative.  Negative for cough, chest tightness, shortness of breath and wheezing.   Cardiovascular: Negative.  Negative for chest pain, palpitations and leg swelling.  Gastrointestinal: Negative for abdominal pain, constipation, blood in stool, abdominal distention and anal bleeding.  Endocrine: Negative.  Negative for cold intolerance, heat intolerance, polydipsia, polyphagia and polyuria.  Genitourinary: Negative.  Negative for dysuria, frequency and difficulty urinating.  Musculoskeletal: Negative.   Skin: Negative.   Allergic/Immunologic: Negative.   Neurological: Negative for dizziness, weakness, light-headedness, numbness and headaches.  Psychiatric/Behavioral: Negative for suicidal ideas, confusion, sleep disturbance, dysphoric mood, decreased concentration and agitation. The patient is not nervous/anxious.     History Past Medical History  Diagnosis Date  . Hypertension   . Hyperlipidemia     He has past surgical history that includes Hernia repair.   His family history includes Hypertension in his mother; Liver cancer in his father. There is no history of Colon cancer, Pancreatic cancer, or Stomach cancer.He reports that he quit smoking about 10 years ago. His smoking use included Cigarettes. He smoked 0.50 packs per day. He does not have any smokeless tobacco history on file. He  reports that he drinks alcohol. He reports that he does not use illicit drugs.  Current Outpatient Prescriptions on File Prior to Visit  Medication Sig Dispense Refill  . fish oil-omega-3 fatty acids 1000 MG capsule Take 1 g by mouth daily. Reported on 08/27/2015    . Multiple Vitamin (MULTIVITAMIN) tablet Take 1 tablet by mouth daily. Reported on 08/27/2015    . tadalafil (CIALIS) 5 MG tablet Take 1 tablet (5 mg total) by mouth daily as needed for erectile dysfunction. (Patient not taking: Reported on 08/27/2015) 30 tablet 5  . traMADol (ULTRAM) 50 MG tablet Take 1 tablet (50 mg total) by mouth every 8 (eight) hours as needed. (Patient not taking: Reported on 08/27/2015) 30 tablet 0   No current facility-administered medications on file prior to visit.     Objective:  Objective Physical Exam  Constitutional: He is oriented to person, place, and time. He appears well-developed and well-nourished. No distress.  HENT:  Head: Normocephalic and atraumatic.  Right Ear: External ear normal.  Left Ear: External ear normal.  Nose: Nose normal.  Mouth/Throat: Oropharynx is clear and moist. No oropharyngeal exudate.  Eyes: Conjunctivae and EOM are normal. Pupils are equal, round, and reactive to light. Right eye exhibits no discharge. Left eye exhibits no discharge.  Neck: Normal range of motion. Neck supple. No JVD present. No thyromegaly present.  Cardiovascular: Normal rate, regular rhythm and intact distal pulses.  Exam reveals no gallop and no friction rub.   No murmur heard. Pulmonary/Chest: Effort normal and breath sounds normal. No respiratory distress. He has no wheezes. He has no rales. He exhibits no tenderness.  Abdominal: Soft. Bowel sounds are normal. He exhibits no distension and no mass. There is no tenderness. There is no rebound and no guarding.  Genitourinary: Rectum normal, prostate normal and penis normal. Guaiac negative stool.  Musculoskeletal: Normal range of motion. He  exhibits no edema or tenderness.  Lymphadenopathy:    He has no cervical adenopathy.  Neurological: He is alert and oriented to person, place, and time. He displays normal reflexes. He exhibits normal muscle tone.  Skin: Skin is warm and dry. No rash noted. He is not diaphoretic. No erythema. No pallor.  Psychiatric: He has a normal mood and affect. His behavior is normal. Judgment and thought content normal.  Nursing note and vitals reviewed.  BP 133/75 mmHg  Pulse 76  Temp(Src) 97.8 F (36.6 C) (Oral)  Ht 5' (1.524 m)  Wt 123 lb 3.2 oz (55.883 kg)  BMI 24.06 kg/m2  SpO2 99% Wt Readings from Last 3 Encounters:  08/27/15 123 lb 3.2 oz (55.883 kg)  02/10/15 118 lb 9.6 oz (53.797 kg)  07/08/14 120 lb 3.2 oz (54.522 kg)     Lab Results  Component Value Date   WBC 8.7 08/27/2015   HGB 13.6 08/27/2015   HCT 40.3 08/27/2015   PLT 326.0 08/27/2015   GLUCOSE 86 08/27/2015   CHOL 176 08/27/2015   TRIG 91.0 08/27/2015   HDL 46.40 08/27/2015   LDLDIRECT 199.2 06/07/2013   LDLCALC 112* 08/27/2015   ALT 19 08/27/2015   AST 22 08/27/2015   NA 141 08/27/2015   K 4.1 08/27/2015   CL 104 08/27/2015   CREATININE 0.92 08/27/2015   BUN 18 08/27/2015   CO2 32 08/27/2015   TSH 0.54 08/27/2015   PSA 0.95 08/27/2015    Dg Chest 2 View  11/25/2012  *RADIOLOGY REPORT* Clinical Data: Heart palpitations CHEST - 2 VIEW Comparison: None. Findings: Normal heart, mediastinal, hilar contours.  The lungs are normally expanded and clear. No acute bony abnormality. IMPRESSION: No acute cardiopulmonary disease. Original Report Authenticated By: Curlene Dolphin, M.D.     Assessment & Plan:  Plan I have discontinued [name] amoxicillin-clavulanate. I am also having him maintain his fish oil-omega-3 fatty acids, multivitamin, tadalafil, traMADol, losartan, hydrochlorothiazide, finasteride, and atorvastatin.  Meds ordered this encounter  Medications  . losartan (COZAAR) 50 MG tablet    Sig: Take 1  tablet (50 mg total) by mouth daily.    Dispense:  90 tablet    Refill:  1    D/C PREVIOUS SCRIPTS FOR THIS MEDICATION  . hydrochlorothiazide (HYDRODIURIL) 25 MG tablet    Sig: Take 1 tablet (25 mg total) by mouth once.    Dispense:  90 tablet    Refill:  1    D/C PREVIOUS SCRIPTS FOR THIS MEDICATION  . finasteride (PROSCAR) 5 MG tablet    Sig: Take 1 tablet (5 mg total) by mouth daily.    Dispense:  90 tablet    Refill:  1  . atorvastatin (LIPITOR) 20 MG tablet    Sig: TAKE 1 TABLET BY MOUTH AT BEDTIME    Dispense:  90 tablet    Refill:  1    D/C PREVIOUS SCRIPTS FOR THIS MEDICATION    Problem List Items Addressed This Visit      Unprioritized   Hyperlipidemia   Relevant Medications   losartan (COZAAR) 50 MG tablet   hydrochlorothiazide (HYDRODIURIL) 25 MG tablet   atorvastatin (LIPITOR) 20 MG tablet   Other Relevant Orders   Comp Met (CMET) (Completed)   Lipid panel (Completed)   POCT urinalysis dipstick (Completed)   PSA (Completed)   TSH (Completed)   HTN (hypertension) -  Primary   Relevant Medications   losartan (COZAAR) 50 MG tablet   hydrochlorothiazide (HYDRODIURIL) 25 MG tablet   atorvastatin (LIPITOR) 20 MG tablet   Other Relevant Orders   Comp Met (CMET) (Completed)   CBC with Differential/Platelet (Completed)   PSA (Completed)   TSH (Completed)   History of BPH   Relevant Medications   finasteride (PROSCAR) 5 MG tablet   Other Relevant Orders   Ambulatory referral to Urology   PSA (Completed)   TSH (Completed)    Other Visit Diagnoses    Frequent urination        Relevant Orders    Ambulatory referral to Urology    POCT urinalysis dipstick (Completed)    PSA (Completed)    TSH (Completed)    Preventative health care        Relevant Orders    Ambulatory referral to Gastroenterology    Need for hepatitis C screening test        Relevant Orders    PSA (Completed)    TSH (Completed)    Hepatitis C antibody (Completed)    Hematuria         Relevant Orders    Urine culture (Completed)       Follow-up: Return in about 6 months (around 02/24/2016), or if symptoms worsen or fail to improve, for hypertension, hyperlipidemia.  Garnet Koyanagi, DO

## 2015-08-30 NOTE — Assessment & Plan Note (Signed)
ghm utd Check labs See AVS 

## 2015-08-31 ENCOUNTER — Encounter: Payer: Self-pay | Admitting: General Practice

## 2015-08-31 ENCOUNTER — Other Ambulatory Visit: Payer: Self-pay | Admitting: Family Medicine

## 2015-08-31 DIAGNOSIS — E785 Hyperlipidemia, unspecified: Secondary | ICD-10-CM

## 2016-01-25 ENCOUNTER — Other Ambulatory Visit: Payer: Self-pay | Admitting: Family Medicine

## 2016-02-25 ENCOUNTER — Ambulatory Visit: Payer: BLUE CROSS/BLUE SHIELD | Admitting: Family Medicine

## 2016-03-03 ENCOUNTER — Ambulatory Visit (INDEPENDENT_AMBULATORY_CARE_PROVIDER_SITE_OTHER): Payer: BLUE CROSS/BLUE SHIELD | Admitting: Family Medicine

## 2016-03-03 ENCOUNTER — Encounter: Payer: Self-pay | Admitting: Family Medicine

## 2016-03-03 VITALS — BP 112/76 | HR 90 | Temp 98.0°F | Ht 60.0 in | Wt 118.8 lb

## 2016-03-03 DIAGNOSIS — I10 Essential (primary) hypertension: Secondary | ICD-10-CM

## 2016-03-03 DIAGNOSIS — H538 Other visual disturbances: Secondary | ICD-10-CM

## 2016-03-03 DIAGNOSIS — E785 Hyperlipidemia, unspecified: Secondary | ICD-10-CM

## 2016-03-03 DIAGNOSIS — Z87438 Personal history of other diseases of male genital organs: Secondary | ICD-10-CM

## 2016-03-03 DIAGNOSIS — Z23 Encounter for immunization: Secondary | ICD-10-CM

## 2016-03-03 DIAGNOSIS — J302 Other seasonal allergic rhinitis: Secondary | ICD-10-CM | POA: Diagnosis not present

## 2016-03-03 MED ORDER — LEVOCETIRIZINE DIHYDROCHLORIDE 5 MG PO TABS: 5.0000 mg | ORAL_TABLET | Freq: Every evening | ORAL | 5 refills | Status: DC

## 2016-03-03 MED ORDER — HYDROCHLOROTHIAZIDE 25 MG PO TABS: 25.0000 mg | ORAL_TABLET | Freq: Every day | ORAL | 1 refills | Status: DC

## 2016-03-03 MED ORDER — FINASTERIDE 5 MG PO TABS: 5.0000 mg | ORAL_TABLET | Freq: Every day | ORAL | 1 refills | Status: DC

## 2016-03-03 MED ORDER — LOSARTAN POTASSIUM 50 MG PO TABS: 50.0000 mg | ORAL_TABLET | Freq: Every day | ORAL | 1 refills | Status: DC

## 2016-03-03 MED ORDER — ATORVASTATIN CALCIUM 20 MG PO TABS
20.0000 mg | ORAL_TABLET | Freq: Every day | ORAL | 1 refills | Status: DC
Start: 2016-03-03 — End: 2016-03-03

## 2016-03-03 MED ORDER — ATORVASTATIN CALCIUM 20 MG PO TABS: 20.0000 mg | ORAL_TABLET | Freq: Every day | ORAL | 1 refills | Status: DC

## 2016-03-03 NOTE — Patient Instructions (Signed)
Hypertension Hypertension, commonly called high blood pressure, is when the force of blood pumping through your arteries is too strong. Your arteries are the blood vessels that carry blood from your heart throughout your body. A blood pressure reading consists of a higher number over a lower number, such as 110/72. The higher number (systolic) is the pressure inside your arteries when your heart pumps. The lower number (diastolic) is the pressure inside your arteries when your heart relaxes. Ideally you want your blood pressure below 120/80. Hypertension forces your heart to work harder to pump blood. Your arteries may become narrow or stiff. Having untreated or uncontrolled hypertension can cause heart attack, stroke, kidney disease, and other problems. RISK FACTORS Some risk factors for high blood pressure are controllable. Others are not.  Risk factors you cannot control include:   Race. You may be at higher risk if you are African American.  Age. Risk increases with age.  Gender. Men are at higher risk than women before age 45 years. After age 65, women are at higher risk than men. Risk factors you can control include:  Not getting enough exercise or physical activity.  Being overweight.  Getting too much fat, sugar, calories, or salt in your diet.  Drinking too much alcohol. SIGNS AND SYMPTOMS Hypertension does not usually cause signs or symptoms. Extremely high blood pressure (hypertensive crisis) may cause headache, anxiety, shortness of breath, and nosebleed. DIAGNOSIS To check if you have hypertension, your health care provider will measure your blood pressure while you are seated, with your arm held at the level of your heart. It should be measured at least twice using the same arm. Certain conditions can cause a difference in blood pressure between your right and left arms. A blood pressure reading that is higher than normal on one occasion does not mean that you need treatment. If  it is not clear whether you have high blood pressure, you may be asked to return on a different day to have your blood pressure checked again. Or, you may be asked to monitor your blood pressure at home for 1 or more weeks. TREATMENT Treating high blood pressure includes making lifestyle changes and possibly taking medicine. Living a healthy lifestyle can help lower high blood pressure. You may need to change some of your habits. Lifestyle changes may include:  Following the DASH diet. This diet is high in fruits, vegetables, and whole grains. It is low in salt, red meat, and added sugars.  Keep your sodium intake below 2,300 mg per day.  Getting at least 30-45 minutes of aerobic exercise at least 4 times per week.  Losing weight if necessary.  Not smoking.  Limiting alcoholic beverages.  Learning ways to reduce stress. Your health care provider may prescribe medicine if lifestyle changes are not enough to get your blood pressure under control, and if one of the following is true:  You are 18-59 years of age and your systolic blood pressure is above 140.  You are 60 years of age or older, and your systolic blood pressure is above 150.  Your diastolic blood pressure is above 90.  You have diabetes, and your systolic blood pressure is over 140 or your diastolic blood pressure is over 90.  You have kidney disease and your blood pressure is above 140/90.  You have heart disease and your blood pressure is above 140/90. Your personal target blood pressure may vary depending on your medical conditions, your age, and other factors. HOME CARE INSTRUCTIONS    Have your blood pressure rechecked as directed by your health care provider.   Take medicines only as directed by your health care provider. Follow the directions carefully. Blood pressure medicines must be taken as prescribed. The medicine does not work as well when you skip doses. Skipping doses also puts you at risk for  problems.  Do not smoke.   Monitor your blood pressure at home as directed by your health care provider. SEEK MEDICAL CARE IF:   You think you are having a reaction to medicines taken.  You have recurrent headaches or feel dizzy.  You have swelling in your ankles.  You have trouble with your vision. SEEK IMMEDIATE MEDICAL CARE IF:  You develop a severe headache or confusion.  You have unusual weakness, numbness, or feel faint.  You have severe chest or abdominal pain.  You vomit repeatedly.  You have trouble breathing. MAKE SURE YOU:   Understand these instructions.  Will watch your condition.  Will get help right away if you are not doing well or get worse.   This information is not intended to replace advice given to you by your health care provider. Make sure you discuss any questions you have with your health care provider.   Document Released: 06/27/2005 Document Revised: 11/11/2014 Document Reviewed: 04/19/2013 Elsevier Interactive Patient Education 2016 [employer]  

## 2016-03-03 NOTE — Progress Notes (Signed)
Patient ID: Lance Oneal, male    DOB: 10-26-1950  Age: 65 y.o. MRN: [id number]    Subjective:  Subjective  HPI Lance Oneal presents for f/u bp and cholesterol.   Pt c/o blurry vision and went to get his drivers license renewed and he did not pass the vision.  He is requesting a referral to opth.  He also needs a referral to go back to Dr Berneice HeinrichManny for his prostate.      Pt also c/o itchy nose and congestion.  He tried a nasal spray otc but is caused bleeding.    Review of Systems  Constitutional: Negative for appetite change, diaphoresis, fatigue and unexpected weight change.  HENT: Positive for congestion, postnasal drip and sneezing.   Eyes: Negative for pain, redness and visual disturbance.  Respiratory: Negative for cough, chest tightness, shortness of breath and wheezing.   Cardiovascular: Negative for chest pain, palpitations and leg swelling.  Endocrine: Negative for cold intolerance, heat intolerance, polydipsia, polyphagia and polyuria.  Genitourinary: Negative for difficulty urinating, dysuria and frequency.  Neurological: Negative for dizziness, light-headedness, numbness and headaches.    History Past Medical History:  Diagnosis Date  . Hyperlipidemia   . Hypertension     He has a past surgical history that includes Hernia repair.   His family history includes Hypertension in his mother; Liver cancer in his father.He reports that he quit smoking about 11 years ago. His smoking use included Cigarettes. He smoked 0.50 packs per day. He does not have any smokeless tobacco history on file. He reports that he drinks alcohol. He reports that he does not use drugs.  Current Outpatient Prescriptions on File Prior to Visit  Medication Sig Dispense Refill  . traMADol (ULTRAM) 50 MG tablet Take 1 tablet (50 mg total) by mouth every 8 (eight) hours as needed. 30 tablet 0   No current facility-administered medications on file prior to visit.      Objective:  Objective  Physical Exam    Constitutional: He is oriented to person, place, and time. Vital signs are normal. He appears well-developed and well-nourished. He is sleeping.  HENT:  Head: Normocephalic and atraumatic.  Nose: Mucosal edema and rhinorrhea present.  Mouth/Throat: Oropharynx is clear and moist.  Eyes: EOM are normal. Pupils are equal, round, and reactive to light.  Neck: Normal range of motion. Neck supple. No thyromegaly present.  Cardiovascular: Normal rate and regular rhythm.   No murmur heard. Pulmonary/Chest: Effort normal and breath sounds normal. No respiratory distress. He has no wheezes. He has no rales. He exhibits no tenderness.  Musculoskeletal: He exhibits no edema or tenderness.  Neurological: He is alert and oriented to person, place, and time.  Skin: Skin is warm and dry.  Psychiatric: He has a normal mood and affect. His behavior is normal. Judgment and thought content normal.  Nursing note and vitals reviewed.  BP 112/76 (BP Location: Left Arm, Patient Position: Sitting, Cuff Size: Small)   Pulse 90   Temp 98 F (36.7 C) (Oral)   Ht 5' (1.524 m)   Wt 118 lb 12.8 oz (53.9 kg)   SpO2 99%   BMI 23.20 kg/m  Wt Readings from Last 3 Encounters:  03/03/16 118 lb 12.8 oz (53.9 kg)  08/27/15 123 lb 3.2 oz (55.9 kg)  02/10/15 118 lb 9.6 oz (53.8 kg)     Lab Results  Component Value Date   WBC 8.7 08/27/2015   HGB 13.6 08/27/2015   HCT 40.3 08/27/2015  PLT 326.0 08/27/2015   GLUCOSE 86 08/27/2015   CHOL 176 08/27/2015   TRIG 91.0 08/27/2015   HDL 46.40 08/27/2015   LDLDIRECT 199.2 06/07/2013   LDLCALC 112 (H) 08/27/2015   ALT 19 08/27/2015   AST 22 08/27/2015   NA 141 08/27/2015   K 4.1 08/27/2015   CL 104 08/27/2015   CREATININE 0.92 08/27/2015   BUN 18 08/27/2015   CO2 32 08/27/2015   TSH 0.54 08/27/2015   PSA 0.95 08/27/2015    Dg Chest 2 View  Result Date: 11/25/2012 *RADIOLOGY REPORT* Clinical Data: Heart palpitations CHEST - 2 VIEW Comparison: None. Findings:  Normal heart, mediastinal, hilar contours.  The lungs are normally expanded and clear. No acute bony abnormality. IMPRESSION: No acute cardiopulmonary disease. Original Report Authenticated By: Britta Mccreedy, M.D.     Assessment & Plan:  Plan  I have discontinued Mr. Lembo fish oil-omega-3 fatty acids, multivitamin, and tadalafil. I am also having him maintain his traMADol, hydrochlorothiazide, finasteride, atorvastatin, losartan, and levocetirizine.  Meds ordered this encounter  Medications  . DISCONTD: losartan (COZAAR) 50 MG tablet    Sig: Take 1 tablet (50 mg total) by mouth daily.    Dispense:  90 tablet    Refill:  1  . DISCONTD: hydrochlorothiazide (HYDRODIURIL) 25 MG tablet    Sig: Take 1 tablet (25 mg total) by mouth daily.    Dispense:  90 tablet    Refill:  1  . DISCONTD: atorvastatin (LIPITOR) 20 MG tablet    Sig: Take 1 tablet (20 mg total) by mouth at bedtime.    Dispense:  90 tablet    Refill:  1  . DISCONTD: finasteride (PROSCAR) 5 MG tablet    Sig: Take 1 tablet (5 mg total) by mouth daily.    Dispense:  90 tablet    Refill:  1  . hydrochlorothiazide (HYDRODIURIL) 25 MG tablet    Sig: Take 1 tablet (25 mg total) by mouth daily.    Dispense:  90 tablet    Refill:  1  . finasteride (PROSCAR) 5 MG tablet    Sig: Take 1 tablet (5 mg total) by mouth daily.    Dispense:  90 tablet    Refill:  1  . atorvastatin (LIPITOR) 20 MG tablet    Sig: Take 1 tablet (20 mg total) by mouth at bedtime.    Dispense:  90 tablet    Refill:  1  . losartan (COZAAR) 50 MG tablet    Sig: Take 1 tablet (50 mg total) by mouth daily.    Dispense:  90 tablet    Refill:  1  . DISCONTD: levocetirizine (XYZAL) 5 MG tablet    Sig: Take 1 tablet (5 mg total) by mouth every evening.    Dispense:  30 tablet    Refill:  5  . levocetirizine (XYZAL) 5 MG tablet    Sig: Take 1 tablet (5 mg total) by mouth every evening.    Dispense:  30 tablet    Refill:  5    Problem List Items Addressed  This Visit      Unprioritized   History of BPH   Relevant Medications   finasteride (PROSCAR) 5 MG tablet   Other Relevant Orders   Ambulatory referral to Urology   HTN (hypertension)    Stable , con't meds      Relevant Medications   hydrochlorothiazide (HYDRODIURIL) 25 MG tablet   atorvastatin (LIPITOR) 20 MG tablet   losartan (COZAAR) 50  MG tablet   Other Relevant Orders   Lipid panel   Comprehensive metabolic panel   Hyperlipidemia    Check labs      Relevant Medications   hydrochlorothiazide (HYDRODIURIL) 25 MG tablet   atorvastatin (LIPITOR) 20 MG tablet   losartan (COZAAR) 50 MG tablet    Other Visit Diagnoses    Seasonal allergies    -  Primary   Relevant Medications   levocetirizine (XYZAL) 5 MG tablet   Hyperlipemia       Relevant Medications   hydrochlorothiazide (HYDRODIURIL) 25 MG tablet   atorvastatin (LIPITOR) 20 MG tablet   losartan (COZAAR) 50 MG tablet   Other Relevant Orders   Lipid panel   Comprehensive metabolic panel   Blurry vision, bilateral       Relevant Orders   Ambulatory referral to Ophthalmology   Need for influenza vaccination       Relevant Orders   Flu Vaccine QUAD 36+ mos PF IM (Fluarix & Fluzone Quad PF) (Completed)      Follow-up: Return in about 6 months (around 09/03/2016) for annual exam, fasting.  Donato SchultzYvonne R Lowne Chase, DO

## 2016-03-03 NOTE — Assessment & Plan Note (Signed)
Check labs 

## 2016-03-03 NOTE — Assessment & Plan Note (Signed)
Stable , con't meds  

## 2016-03-08 ENCOUNTER — Other Ambulatory Visit (INDEPENDENT_AMBULATORY_CARE_PROVIDER_SITE_OTHER): Payer: BLUE CROSS/BLUE SHIELD

## 2016-03-08 DIAGNOSIS — I10 Essential (primary) hypertension: Secondary | ICD-10-CM | POA: Diagnosis not present

## 2016-03-08 DIAGNOSIS — E785 Hyperlipidemia, unspecified: Secondary | ICD-10-CM | POA: Diagnosis not present

## 2016-03-08 LAB — COMPREHENSIVE METABOLIC PANEL
ALT: 21 U/L (ref 0–53)
AST: 20 U/L (ref 0–37)
Albumin: 4.2 g/dL (ref 3.5–5.2)
Alkaline Phosphatase: 50 U/L (ref 39–117)
BUN: 22 mg/dL (ref 6–23)
CO2: 33 meq/L — AB (ref 19–32)
Calcium: 9.1 mg/dL (ref 8.4–10.5)
Chloride: 102 mEq/L (ref 96–112)
Creatinine, Ser: 0.99 mg/dL (ref 0.40–1.50)
GFR: 80.67 mL/min (ref >60.00)
GLUCOSE: 96 mg/dL (ref 70–99)
POTASSIUM: 3.9 meq/L (ref 3.5–5.1)
SODIUM: 139 meq/L (ref 135–145)
TOTAL PROTEIN: 7.1 g/dL (ref 6.0–8.3)
Total Bilirubin: 0.8 mg/dL (ref 0.2–1.2)

## 2016-03-08 LAB — LIPID PANEL
CHOL/HDL RATIO: 5
CHOLESTEROL: 238 mg/dL — AB (ref 0–200)
HDL: 44.9 mg/dL (ref >39.00)
LDL Cholesterol: 171 mg/dL — ABNORMAL HIGH (ref 0–99)
NONHDL: 193.41
TRIGLYCERIDES: 111 mg/dL (ref 0.0–149.0)
VLDL: 22.2 mg/dL (ref 0.0–40.0)

## 2016-04-29 LAB — HM DIABETES EYE EXAM

## 2016-05-31 ENCOUNTER — Encounter: Payer: Self-pay | Admitting: Family Medicine

## 2016-05-31 NOTE — Progress Notes (Signed)
Patient has moderate nonexudative macular degeneration. Visually significant cataracts worse in the right eye. [facility]

## 2016-08-11 ENCOUNTER — Other Ambulatory Visit: Payer: Self-pay | Admitting: Family Medicine

## 2016-08-11 DIAGNOSIS — E785 Hyperlipidemia, unspecified: Secondary | ICD-10-CM

## 2016-08-11 DIAGNOSIS — I10 Essential (primary) hypertension: Secondary | ICD-10-CM

## 2016-09-06 ENCOUNTER — Encounter: Payer: Self-pay | Admitting: Family Medicine

## 2016-09-06 ENCOUNTER — Ambulatory Visit (INDEPENDENT_AMBULATORY_CARE_PROVIDER_SITE_OTHER): Payer: BLUE CROSS/BLUE SHIELD | Admitting: Family Medicine

## 2016-09-06 ENCOUNTER — Ambulatory Visit (HOSPITAL_BASED_OUTPATIENT_CLINIC_OR_DEPARTMENT_OTHER)
Admission: RE | Admit: 2016-09-06 | Discharge: 2016-09-06 | Disposition: A | Discharge status: Home / Self Care | Payer: BLUE CROSS/BLUE SHIELD | Source: Ambulatory Visit | Attending: Family Medicine | Admitting: Family Medicine

## 2016-09-06 VITALS — BP 119/77 | HR 78 | Temp 97.9°F | Ht 60.5 in | Wt 124.8 lb

## 2016-09-06 DIAGNOSIS — I10 Essential (primary) hypertension: Secondary | ICD-10-CM | POA: Diagnosis not present

## 2016-09-06 DIAGNOSIS — Z0000 Encounter for general adult medical examination without abnormal findings: Secondary | ICD-10-CM | POA: Diagnosis not present

## 2016-09-06 DIAGNOSIS — I700 Atherosclerosis of aorta: Secondary | ICD-10-CM | POA: Insufficient documentation

## 2016-09-06 DIAGNOSIS — R319 Hematuria, unspecified: Secondary | ICD-10-CM

## 2016-09-06 DIAGNOSIS — R059 Cough, unspecified: Secondary | ICD-10-CM | POA: Insufficient documentation

## 2016-09-06 DIAGNOSIS — Z87891 Personal history of nicotine dependence: Secondary | ICD-10-CM | POA: Insufficient documentation

## 2016-09-06 DIAGNOSIS — E785 Hyperlipidemia, unspecified: Secondary | ICD-10-CM | POA: Diagnosis not present

## 2016-09-06 DIAGNOSIS — R05 Cough: Secondary | ICD-10-CM | POA: Diagnosis not present

## 2016-09-06 DIAGNOSIS — Z23 Encounter for immunization: Secondary | ICD-10-CM | POA: Diagnosis not present

## 2016-09-06 LAB — POC URINALSYSI DIPSTICK (AUTOMATED)
BILIRUBIN UA: NEGATIVE
Glucose, UA: NEGATIVE
Ketones, UA: NEGATIVE
Leukocytes, UA: NEGATIVE
Nitrite, UA: NEGATIVE
Protein, UA: NEGATIVE
UROBILINOGEN UA: 0.2
pH, UA: 6

## 2016-09-06 LAB — COMPREHENSIVE METABOLIC PANEL
ALBUMIN: 4.3 g/dL (ref 3.5–5.2)
ALT: 17 U/L (ref 0–53)
AST: 19 U/L (ref 0–37)
Alkaline Phosphatase: 49 U/L (ref 39–117)
BILIRUBIN TOTAL: 0.8 mg/dL (ref 0.2–1.2)
BUN: 24 mg/dL — AB (ref 6–23)
CALCIUM: 9.4 mg/dL (ref 8.4–10.5)
CO2: 32 mEq/L (ref 19–32)
CREATININE: 1.03 mg/dL (ref 0.40–1.50)
Chloride: 104 mEq/L (ref 96–112)
GFR: 76.95 mL/min (ref >60.00)
Glucose, Bld: 88 mg/dL (ref 70–99)
Potassium: 4.4 mEq/L (ref 3.5–5.1)
Sodium: 142 mEq/L (ref 135–145)
TOTAL PROTEIN: 6.7 g/dL (ref 6.0–8.3)

## 2016-09-06 LAB — PSA: PSA: 0.75 ng/mL (ref 0.10–4.00)

## 2016-09-06 LAB — CBC
HCT: 41.7 % (ref 39.0–52.0)
Hemoglobin: 14 g/dL (ref 13.0–17.0)
MCHC: 33.7 g/dL (ref 30.0–36.0)
MCV: 89.2 fl (ref 78.0–100.0)
PLATELETS: 331 10*3/uL (ref 150.0–400.0)
RBC: 4.67 Mil/uL (ref 4.22–5.81)
RDW: 13.3 % (ref 11.5–15.5)
WBC: 9 10*3/uL (ref 4.0–10.5)

## 2016-09-06 LAB — LIPID PANEL
CHOLESTEROL: 171 mg/dL (ref 0–200)
HDL: 40.7 mg/dL (ref >39.00)
NonHDL: 130.54
TRIGLYCERIDES: 256 mg/dL — AB (ref 0.0–149.0)
Total CHOL/HDL Ratio: 4
VLDL: 51.2 mg/dL — AB (ref 0.0–40.0)

## 2016-09-06 LAB — TSH: TSH: 0.59 u[IU]/mL (ref 0.35–4.50)

## 2016-09-06 LAB — LDL CHOLESTEROL, DIRECT: LDL DIRECT: 101 mg/dL

## 2016-09-06 NOTE — Progress Notes (Signed)
cxr Patient ID: Lance Oneal, male    DOB: 05/14/1951  Age: 66 y.o. MRN: [id number]    Subjective:  Subjective  HPI Lance Oneal presents for cpe and labs He is also c/o cough for last few days.    Review of Systems  Constitutional: Negative.  Negative for fatigue and unexpected weight change.  HENT: Negative for congestion, ear pain, hearing loss, nosebleeds, postnasal drip, rhinorrhea, sinus pressure, sneezing and tinnitus.   Eyes: Negative for photophobia, discharge, itching and visual disturbance.  Respiratory: Positive for cough. Negative for shortness of breath.   Cardiovascular: Negative.  Negative for chest pain and palpitations.  Gastrointestinal: Negative for abdominal distention, abdominal pain, anal bleeding, blood in stool and constipation.  Endocrine: Negative.   Genitourinary: Negative.   Musculoskeletal: Negative.   Skin: Negative.   Allergic/Immunologic: Negative.   Neurological: Negative for dizziness, weakness, light-headedness, numbness and headaches.  Psychiatric/Behavioral: Negative for agitation, confusion, decreased concentration, dysphoric mood, sleep disturbance and suicidal ideas. The patient is not nervous/anxious.     History Past Medical History:  Diagnosis Date  . Hyperlipidemia   . Hypertension     He has a past surgical history that includes Hernia repair.   His family history includes Hypertension in his mother; Liver cancer in his father.He reports that he quit smoking about 11 years ago. His smoking use included Cigarettes. He smoked 0.50 packs per day. He has never used smokeless tobacco. He reports that he drinks alcohol. He reports that he does not use drugs.  Current Outpatient Prescriptions on File Prior to Visit  Medication Sig Dispense Refill  . atorvastatin (LIPITOR) 20 MG tablet TAKE 1 TABLET (20 MG TOTAL) BY MOUTH AT BEDTIME. 90 tablet 1  . finasteride (PROSCAR) 5 MG tablet Take 1 tablet (5 mg total) by mouth daily. 90 tablet 1  .  hydrochlorothiazide (HYDRODIURIL) 25 MG tablet TAKE 1 TABLET (25 MG TOTAL) BY MOUTH DAILY. 90 tablet 1  . levocetirizine (XYZAL) 5 MG tablet Take 1 tablet (5 mg total) by mouth every evening. 30 tablet 5  . losartan (COZAAR) 50 MG tablet TAKE 1 TABLET (50 MG TOTAL) BY MOUTH DAILY. 90 tablet 1  . traMADol (ULTRAM) 50 MG tablet Take 1 tablet (50 mg total) by mouth every 8 (eight) hours as needed. 30 tablet 0   No current facility-administered medications on file prior to visit.      Objective:  Objective  Physical Exam  Constitutional: He is oriented to person, place, and time. He appears well-developed and well-nourished. No distress.  HENT:  Head: Normocephalic and atraumatic.  Right Ear: External ear normal.  Left Ear: External ear normal.  Nose: Nose normal.  Mouth/Throat: Oropharynx is clear and moist. No oropharyngeal exudate.  Eyes: Conjunctivae and EOM are normal. Pupils are equal, round, and reactive to light. Right eye exhibits no discharge. Left eye exhibits no discharge.  Neck: Normal range of motion. Neck supple. No JVD present. No thyromegaly present.  Cardiovascular: Normal rate, regular rhythm and intact distal pulses.  Exam reveals no gallop and no friction rub.   No murmur heard. Pulmonary/Chest: Effort normal and breath sounds normal. No respiratory distress. He has no wheezes. He has no rales. He exhibits no tenderness.  Abdominal: Soft. Bowel sounds are normal. He exhibits no distension and no mass. There is no tenderness. There is no rebound and no guarding.  Genitourinary:  Genitourinary Comments: Per urology  Musculoskeletal: Normal range of motion. He exhibits no edema or tenderness.  Lymphadenopathy:  He has no cervical adenopathy.  Neurological: He is alert and oriented to person, place, and time. He displays normal reflexes. He exhibits normal muscle tone.  Skin: Skin is warm and dry. No rash noted. He is not diaphoretic. No erythema. No pallor.    Psychiatric: He has a normal mood and affect. His behavior is normal. Judgment and thought content normal.  Nursing note and vitals reviewed.  BP 119/77 (BP Location: Left Arm, Patient Position: Sitting, Cuff Size: Normal)   Pulse 78   Temp 97.9 F (36.6 C) (Oral)   Ht 5' 0.5" (1.537 m)   Wt 124 lb 12.8 oz (56.6 kg)   SpO2 98% Comment: RA  BMI 23.97 kg/m  Wt Readings from Last 3 Encounters:  09/06/16 124 lb 12.8 oz (56.6 kg)  03/03/16 118 lb 12.8 oz (53.9 kg)  08/27/15 123 lb 3.2 oz (55.9 kg)     Lab Results  Component Value Date   WBC 8.7 08/27/2015   HGB 13.6 08/27/2015   HCT 40.3 08/27/2015   PLT 326.0 08/27/2015   GLUCOSE 96 03/08/2016   CHOL 238 (H) 03/08/2016   TRIG 111.0 03/08/2016   HDL 44.90 03/08/2016   LDLDIRECT 199.2 06/07/2013   LDLCALC 171 (H) 03/08/2016   ALT 21 03/08/2016   AST 20 03/08/2016   NA 139 03/08/2016   K 3.9 03/08/2016   CL 102 03/08/2016   CREATININE 0.99 03/08/2016   BUN 22 03/08/2016   CO2 33 (H) 03/08/2016   TSH 0.54 08/27/2015   PSA 0.95 08/27/2015    Dg Chest 2 View  Result Date: 11/25/2012 *RADIOLOGY REPORT* Clinical Data: Heart palpitations CHEST - 2 VIEW Comparison: None. Findings: Normal heart, mediastinal, hilar contours.  The lungs are normally expanded and clear. No acute bony abnormality. IMPRESSION: No acute cardiopulmonary disease. Original Report Authenticated By: Lance Oneal, M.D.     Assessment & Plan:  Plan  I am having Lance Oneal maintain his traMADol, finasteride, levocetirizine, losartan, atorvastatin, and hydrochlorothiazide.  No orders of the defined types were placed in this encounter.   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      Relevant Orders   DG Chest 2 View   HTN (hypertension)    Stable con't meds-- losartan and hctz rto 6 months      Relevant  Orders   CBC   Comprehensive metabolic panel   PSA   TSH   Lipid panel   POCT Urinalysis Dipstick (Automated)   Hyperlipidemia    Cont' statin Check labs      Preventative health care - Primary    ghm utd Check labs con't meds       Relevant Orders   CBC   Comprehensive metabolic panel   PSA   TSH   Lipid panel   POCT Urinalysis Dipstick (Automated)    Other Visit Diagnoses    Hyperlipidemia LDL goal <100       Relevant Orders   CBC   Comprehensive metabolic panel   PSA   TSH   Lipid panel   POCT Urinalysis Dipstick (Automated)      Follow-up: Return for hypertension, hyperlipidemia.  Donato SchultzYvonne R Lowne Chase, DO     [id number]

## 2016-09-06 NOTE — Progress Notes (Signed)
Pre visit review using our clinic review tool, if applicable. No additional management support is needed unless otherwise documented below in the visit note. 

## 2016-09-06 NOTE — Patient Instructions (Signed)
Preventive Care 65 Years and Older, Male Preventive care refers to lifestyle choices and visits with your health care provider that can promote health and wellness. What does preventive care include?  A yearly physical exam. This is also called an annual well check.  Dental exams once or twice a year.  Routine eye exams. Ask your health care provider how often you should have your eyes checked.  Personal lifestyle choices, including:  Daily care of your teeth and gums.  Regular physical activity.  Eating a healthy diet.  Avoiding tobacco and drug use.  Limiting alcohol use.  Practicing safe sex.  Taking low doses of aspirin every day.  Taking vitamin and mineral supplements as recommended by your health care provider. What happens during an annual well check? The services and screenings done by your health care provider during your annual well check will depend on your age, overall health, lifestyle risk factors, and family history of disease. Counseling  Your health care provider may ask you questions about your:  Alcohol use.  Tobacco use.  Drug use.  Emotional well-being.  Home and relationship well-being.  Sexual activity.  Eating habits.  History of falls.  Memory and ability to understand (cognition).  Work and work environment. Screening  You may have the following tests or measurements:  Height, weight, and BMI.  Blood pressure.  Lipid and cholesterol levels. These may be checked every 5 years, or more frequently if you are over 50 years old.  Skin check.  Lung cancer screening. You may have this screening every year starting at age 55 if you have a 30-pack-year history of smoking and currently smoke or have quit within the past 15 years.  Fecal occult blood test (FOBT) of the stool. You may have this test every year starting at age 50.  Flexible sigmoidoscopy or colonoscopy. You may have a sigmoidoscopy every 5 years or a colonoscopy every 10  years starting at age 50.  Prostate cancer screening. Recommendations will vary depending on your family history and other risks.  Hepatitis C blood test.  Hepatitis B blood test.  Sexually transmitted disease (STD) testing.  Diabetes screening. This is done by checking your blood sugar (glucose) after you have not eaten for a while (fasting). You may have this done every 1-3 years.  Abdominal aortic aneurysm (AAA) screening. You may need this if you are a current or former smoker.  Osteoporosis. You may be screened starting at age 70 if you are at high risk. Talk with your health care provider about your test results, treatment options, and if necessary, the need for more tests. Vaccines  Your health care provider may recommend certain vaccines, such as:  Influenza vaccine. This is recommended every year.  Tetanus, diphtheria, and acellular pertussis (Tdap, Td) vaccine. You may need a Td booster every 10 years.  Varicella vaccine. You may need this if you have not been vaccinated.  Zoster vaccine. You may need this after age 60.  Measles, mumps, and rubella (MMR) vaccine. You may need at least one dose of MMR if you were born in 1957 or later. You may also need a second dose.  Pneumococcal 13-valent conjugate (PCV13) vaccine. One dose is recommended after age 65.  Pneumococcal polysaccharide (PPSV23) vaccine. One dose is recommended after age 65.  Meningococcal vaccine. You may need this if you have certain conditions.  Hepatitis A vaccine. You may need this if you have certain conditions or if you travel or work in places where   you may be exposed to hepatitis A.  Hepatitis B vaccine. You may need this if you have certain conditions or if you travel or work in places where you may be exposed to hepatitis B.  Haemophilus influenzae type b (Hib) vaccine. You may need this if you have certain risk factors. Talk to your health care provider about which screenings and vaccines you  need and how often you need them. This information is not intended to replace advice given to you by your health care provider. Make sure you discuss any questions you have with your health care provider. Document Released: 07/24/2015 Document Revised: 03/16/2016 Document Reviewed: 04/28/2015 Elsevier Interactive Patient Education  2017 [employer].

## 2016-09-06 NOTE — Progress Notes (Signed)
Patient ID: Lance Oneal, male   DOB: Feb 18, 1951, 66 y.o.   MRN: [id number]   Subjective:    Hypertension   Hyperlipidemia    Review of Systems       Objective:    Physical Exam       Assessment & Plan:

## 2016-09-06 NOTE — Assessment & Plan Note (Signed)
Cont' statin Check labs

## 2016-09-06 NOTE — Assessment & Plan Note (Signed)
ghm utd Check labs con't meds

## 2016-09-06 NOTE — Assessment & Plan Note (Signed)
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

## 2016-09-06 NOTE — Assessment & Plan Note (Signed)
Stable con't meds-- losartan and hctz rto 6 months

## 2016-09-07 LAB — URINE CULTURE: Organism ID, Bacteria: NO GROWTH

## 2016-09-15 ENCOUNTER — Other Ambulatory Visit (INDEPENDENT_AMBULATORY_CARE_PROVIDER_SITE_OTHER): Payer: BLUE CROSS/BLUE SHIELD

## 2016-09-15 DIAGNOSIS — I10 Essential (primary) hypertension: Secondary | ICD-10-CM

## 2016-09-15 LAB — FECAL OCCULT BLOOD, IMMUNOCHEMICAL: FECAL OCCULT BLD: NEGATIVE

## 2016-09-26 ENCOUNTER — Encounter: Payer: Self-pay | Admitting: Family Medicine

## 2016-09-26 ENCOUNTER — Ambulatory Visit (INDEPENDENT_AMBULATORY_CARE_PROVIDER_SITE_OTHER): Payer: BLUE CROSS/BLUE SHIELD | Admitting: Family Medicine

## 2016-09-26 VITALS — BP 110/68 | HR 74 | Temp 98.0°F | Resp 16

## 2016-09-26 DIAGNOSIS — J324 Chronic pansinusitis: Secondary | ICD-10-CM

## 2016-09-26 DIAGNOSIS — J029 Acute pharyngitis, unspecified: Secondary | ICD-10-CM

## 2016-09-26 DIAGNOSIS — J301 Allergic rhinitis due to pollen: Secondary | ICD-10-CM | POA: Diagnosis not present

## 2016-09-26 MED ORDER — FLUTICASONE PROPIONATE 50 MCG/ACT NA SUSP: 2.0000 | Freq: Every day | NASAL | 6 refills | Status: DC

## 2016-09-26 MED ORDER — LEVOCETIRIZINE DIHYDROCHLORIDE 5 MG PO TABS: 5.0000 mg | ORAL_TABLET | Freq: Every evening | ORAL | 3 refills | Status: DC

## 2016-09-26 MED ORDER — AMOXICILLIN-POT CLAVULANATE 875-125 MG PO TABS: 1.0000 | ORAL_TABLET | Freq: Two times a day (BID) | ORAL | 0 refills | Status: DC

## 2016-09-26 NOTE — Progress Notes (Signed)
Pre visit review using our clinic review tool, if applicable. No additional management support is needed unless otherwise documented below in the visit note. 

## 2016-09-26 NOTE — Patient Instructions (Signed)

## 2016-09-26 NOTE — Progress Notes (Signed)
Subjective:  I acted as a Neurosurgeon for Dr. Delman Kitten, LPN    Patient ID: Lance Oneal, male    DOB: 1950/10/10, 66 y.o.   MRN: [id number]  Chief Complaint  Patient presents with  . Sore Throat    Sore Throat   This is a new problem. The current episode started 1 to 4 weeks ago. The pain is worse on the right side. There has been no fever. The pain is at a severity of 9/10. Pertinent negatives include no congestion, coughing, headaches or vomiting.    Patient is in today for sore throat for about 1 week.   Patient Care Team: Donato Schultz, DO as PCP - General (Family Medicine) Hilliard Clark. Sabino Gasser, MD as Consulting Physician (Urology) Sinda Du, MD as Consulting Physician (Ophthalmology)   Past Medical History:  Diagnosis Date  . Hyperlipidemia   . Hypertension     Past Surgical History:  Procedure Laterality Date  . HERNIA REPAIR      Family History  Problem Relation Age of Onset  . Hypertension Mother   . Colon cancer Neg Hx   . Pancreatic cancer Neg Hx   . Stomach cancer Neg Hx   . Liver cancer    . Liver cancer Father     Social History   Social History  . Marital status: Married    Spouse name: N/A  . Number of children: N/A  . Years of education: N/A   Occupational History  . Not on file.   Social History Main Topics  . Smoking status: Former Smoker    Packs/day: 0.50    Types: Cigarettes    Quit date: 02/26/2005  . Smokeless tobacco: Never Used  . Alcohol use Yes  . Drug use: No  . Sexual activity: Yes    Partners: Female   Other Topics Concern  . Not on file   Social History Narrative  . No narrative on file    Outpatient Medications Prior to Visit  Medication Sig Dispense Refill  . atorvastatin (LIPITOR) 20 MG tablet TAKE 1 TABLET (20 MG TOTAL) BY MOUTH AT BEDTIME. 90 tablet 1  . finasteride (PROSCAR) 5 MG tablet Take 1 tablet (5 mg total) by mouth daily. 90 tablet 1  . hydrochlorothiazide (HYDRODIURIL) 25 MG tablet TAKE 1  TABLET (25 MG TOTAL) BY MOUTH DAILY. 90 tablet 1  . losartan (COZAAR) 50 MG tablet TAKE 1 TABLET (50 MG TOTAL) BY MOUTH DAILY. 90 tablet 1  . traMADol (ULTRAM) 50 MG tablet Take 1 tablet (50 mg total) by mouth every 8 (eight) hours as needed. 30 tablet 0  . levocetirizine (XYZAL) 5 MG tablet Take 1 tablet (5 mg total) by mouth every evening. 30 tablet 5   No facility-administered medications prior to visit.     Allergies  Allergen Reactions  . Lisinopril     Cough     Review of Systems  Constitutional: Negative for fever.  HENT: Negative for congestion.   Eyes: Negative for blurred vision.  Respiratory: Negative for cough.   Cardiovascular: Negative for chest pain and palpitations.  Gastrointestinal: Negative for vomiting.  Musculoskeletal: Negative for back pain.  Skin: Negative for rash.  Neurological: Negative for loss of consciousness and headaches.       Objective:    Physical Exam  Constitutional: He is oriented to person, place, and time. He appears well-developed and well-nourished. No distress.  HENT:  Head: Normocephalic and atraumatic.  Eyes: Conjunctivae are normal.  Neck: Normal range of motion. No thyromegaly present.  Cardiovascular: Normal rate and regular rhythm.   Pulmonary/Chest: Effort normal and breath sounds normal. He has no wheezes.  Abdominal: Soft. Bowel sounds are normal. There is no tenderness.  Musculoskeletal: Normal range of motion. He exhibits no edema or deformity.  Neurological: He is alert and oriented to person, place, and time.  Skin: Skin is warm and dry. He is not diaphoretic.  Psychiatric: He has a normal mood and affect.    BP 110/68 (BP Location: Right Arm, Patient Position: Sitting, Cuff Size: Normal)   Pulse 74   Temp 98 F (36.7 C) (Oral)   Resp 16   SpO2 96%  Wt Readings from Last 3 Encounters:  09/06/16 124 lb 12.8 oz (56.6 kg)  03/03/16 118 lb 12.8 oz (53.9 kg)  08/27/15 123 lb 3.2 oz (55.9 kg)   BP Readings from  Last 3 Encounters:  09/26/16 110/68  09/06/16 119/77  03/03/16 112/76     Immunization History  Administered Date(s) Administered  . Influenza,inj,Quad PF,36+ Mos 04/07/2013, 03/05/2014, 03/03/2016  . Pneumococcal Conjugate-13 09/06/2016  . Tdap 04/17/2013  . Zoster 04/17/2013    Health Maintenance  Topic Date Due  . HIV Screening  05/02/1966  . COLONOSCOPY  05/02/2001  . PNA vac Low Risk Adult (2 of 2 - PPSV23) 09/06/2017  . TETANUS/TDAP  04/18/2023  . INFLUENZA VACCINE  Completed  . Hepatitis C Screening  Completed    Lab Results  Component Value Date   WBC 9.0 09/06/2016   HGB 14.0 09/06/2016   HCT 41.7 09/06/2016   PLT 331.0 09/06/2016   GLUCOSE 88 09/06/2016   CHOL 171 09/06/2016   TRIG 256.0 (H) 09/06/2016   HDL 40.70 09/06/2016   LDLDIRECT 101.0 09/06/2016   LDLCALC 171 (H) 03/08/2016   ALT 17 09/06/2016   AST 19 09/06/2016   NA 142 09/06/2016   K 4.4 09/06/2016   CL 104 09/06/2016   CREATININE 1.03 09/06/2016   BUN 24 (H) 09/06/2016   CO2 32 09/06/2016   TSH 0.59 09/06/2016   PSA 0.75 09/06/2016    Lab Results  Component Value Date   TSH 0.59 09/06/2016   Lab Results  Component Value Date   WBC 9.0 09/06/2016   HGB 14.0 09/06/2016   HCT 41.7 09/06/2016   MCV 89.2 09/06/2016   PLT 331.0 09/06/2016   Lab Results  Component Value Date   NA 142 09/06/2016   K 4.4 09/06/2016   CO2 32 09/06/2016   GLUCOSE 88 09/06/2016   BUN 24 (H) 09/06/2016   CREATININE 1.03 09/06/2016   BILITOT 0.8 09/06/2016   ALKPHOS 49 09/06/2016   AST 19 09/06/2016   ALT 17 09/06/2016   PROT 6.7 09/06/2016   ALBUMIN 4.3 09/06/2016   CALCIUM 9.4 09/06/2016   GFR 76.95 09/06/2016   Lab Results  Component Value Date   CHOL 171 09/06/2016   Lab Results  Component Value Date   HDL 40.70 09/06/2016   Lab Results  Component Value Date   LDLCALC 171 (H) 03/08/2016   Lab Results  Component Value Date   TRIG 256.0 (H) 09/06/2016   Lab Results  Component Value  Date   CHOLHDL 4 09/06/2016   No results found for: HGBA1C       Assessment & Plan:   Problem List Items Addressed This Visit      Unprioritized   Pharyngitis    Other Visit Diagnoses    Pansinusitis, unspecified chronicity    -  Primary   Relevant Medications   fluticasone (FLONASE) 50 MCG/ACT nasal spray   levocetirizine (XYZAL) 5 MG tablet   amoxicillin-clavulanate (AUGMENTIN) 875-125 MG tablet   Seasonal allergic rhinitis due to pollen, unspecified chronicity       Relevant Medications   fluticasone (FLONASE) 50 MCG/ACT nasal spray   levocetirizine (XYZAL) 5 MG tablet   Sore throat       Relevant Orders   POCT rapid strep A      I am having Mr. Buchberger start on fluticasone and amoxicillin-clavulanate. I am also having him maintain his traMADol, finasteride, losartan, atorvastatin, hydrochlorothiazide, and levocetirizine.  Meds ordered this encounter  Medications  . DISCONTD: levocetirizine (XYZAL) 5 MG tablet    Sig: Take 1 tablet (5 mg total) by mouth every evening.    Dispense:  90 tablet    Refill:  3  . fluticasone (FLONASE) 50 MCG/ACT nasal spray    Sig: Place 2 sprays into both nostrils daily.    Dispense:  16 g    Refill:  6  . levocetirizine (XYZAL) 5 MG tablet    Sig: Take 1 tablet (5 mg total) by mouth every evening.    Dispense:  90 tablet    Refill:  3  . amoxicillin-clavulanate (AUGMENTIN) 875-125 MG tablet    Sig: Take 1 tablet by mouth 2 (two) times daily.    Dispense:  20 tablet    Refill:  0    CMA served as scribe during this visit. History, Physical and Plan performed by medical provider. Documentation and orders reviewed and attested to.  Donato Schultz, DO   Patient ID: Demarrius Guerrero, male   DOB: 18-May-1951, 66 y.o.   MRN: [id number]

## 2016-09-30 LAB — POCT RAPID STREP A (OFFICE): RAPID STREP A SCREEN: NEGATIVE

## 2016-10-06 ENCOUNTER — Telehealth: Payer: Self-pay | Admitting: Family Medicine

## 2016-10-06 NOTE — Telephone Encounter (Signed)
They can either do zyrtec  Or get xyzal on shelf

## 2016-10-06 NOTE — Telephone Encounter (Signed)
Note from pharmacy that levocetirizine is on back order. Offer alternative.

## 2016-10-06 NOTE — Telephone Encounter (Signed)
Patient and pharmacy notified of OTC suggestions.

## 2016-11-21 ENCOUNTER — Telehealth: Payer: Self-pay | Admitting: Family Medicine

## 2016-11-21 NOTE — Telephone Encounter (Signed)
Tawanna SatLe Graeson Tel [phone]  Pt came to office stating received a bill and does not know why received a bill for [phone]. Pt would like to have the bill verified (pt left copies of bill- given to FabricaEbony). Please advise.

## 2016-12-01 NOTE — Telephone Encounter (Signed)
Patient came in to check status of bill issue, request call back asap

## 2016-12-02 NOTE — Telephone Encounter (Signed)
Called patient and let him know claim was resubmitted and to disregard the claim

## 2017-02-27 ENCOUNTER — Ambulatory Visit: Payer: BLUE CROSS/BLUE SHIELD | Admitting: Family Medicine

## 2017-02-27 ENCOUNTER — Ambulatory Visit (INDEPENDENT_AMBULATORY_CARE_PROVIDER_SITE_OTHER): Payer: BLUE CROSS/BLUE SHIELD | Admitting: Family Medicine

## 2017-02-27 ENCOUNTER — Encounter: Payer: Self-pay | Admitting: Family Medicine

## 2017-02-27 VITALS — BP 110/70 | HR 81 | Wt 116.0 lb

## 2017-02-27 DIAGNOSIS — R3915 Urgency of urination: Secondary | ICD-10-CM | POA: Diagnosis not present

## 2017-02-27 DIAGNOSIS — N401 Enlarged prostate with lower urinary tract symptoms: Secondary | ICD-10-CM | POA: Diagnosis not present

## 2017-02-27 DIAGNOSIS — Z87438 Personal history of other diseases of male genital organs: Secondary | ICD-10-CM

## 2017-02-27 DIAGNOSIS — I10 Essential (primary) hypertension: Secondary | ICD-10-CM

## 2017-02-27 DIAGNOSIS — E785 Hyperlipidemia, unspecified: Secondary | ICD-10-CM

## 2017-02-27 DIAGNOSIS — J301 Allergic rhinitis due to pollen: Secondary | ICD-10-CM | POA: Diagnosis not present

## 2017-02-27 DIAGNOSIS — N50812 Left testicular pain: Secondary | ICD-10-CM

## 2017-02-27 LAB — POC URINALSYSI DIPSTICK (AUTOMATED)
Bilirubin, UA: NEGATIVE
Blood, UA: NEGATIVE
Glucose, UA: NEGATIVE
Ketones, UA: NEGATIVE
Leukocytes, UA: NEGATIVE
Nitrite, UA: NEGATIVE
PROTEIN UA: NEGATIVE
UROBILINOGEN UA: 0.2 U/dL
pH, UA: 6 (ref 5.0–8.0)

## 2017-02-27 LAB — HEMOCCULT GUIAC POC 1CARD (OFFICE): Fecal Occult Blood, POC: NEGATIVE

## 2017-02-27 MED ORDER — LOSARTAN POTASSIUM 50 MG PO TABS: 50.0000 mg | ORAL_TABLET | Freq: Every day | ORAL | 1 refills | Status: DC

## 2017-02-27 MED ORDER — FINASTERIDE 5 MG PO TABS
5.0000 mg | ORAL_TABLET | Freq: Every day | ORAL | 1 refills | Status: DC
Start: 2017-02-27 — End: 2017-09-04

## 2017-02-27 MED ORDER — FLUTICASONE PROPIONATE 50 MCG/ACT NA SUSP: 2.0000 | Freq: Every day | NASAL | 6 refills | Status: DC

## 2017-02-27 MED ORDER — FINASTERIDE 5 MG PO TABS: 5.0000 mg | ORAL_TABLET | Freq: Every day | ORAL | 1 refills | Status: DC

## 2017-02-27 MED ORDER — HYDROCHLOROTHIAZIDE 25 MG PO TABS: 25.0000 mg | ORAL_TABLET | Freq: Every day | ORAL | 1 refills | Status: DC

## 2017-02-27 MED ORDER — LEVOCETIRIZINE DIHYDROCHLORIDE 5 MG PO TABS: 5.0000 mg | ORAL_TABLET | Freq: Every evening | ORAL | 3 refills | Status: DC

## 2017-02-27 MED ORDER — ATORVASTATIN CALCIUM 20 MG PO TABS: 20.0000 mg | ORAL_TABLET | Freq: Every day | ORAL | 1 refills | Status: DC

## 2017-02-27 MED ORDER — CIPROFLOXACIN HCL 500 MG PO TABS: 500.0000 mg | ORAL_TABLET | Freq: Two times a day (BID) | ORAL | 0 refills | Status: DC

## 2017-02-27 NOTE — Assessment & Plan Note (Addendum)
New problem Check labs cipro Refer to urology  R testicle enlarged --- non tender

## 2017-02-27 NOTE — Assessment & Plan Note (Signed)
Symptoms worse  F/u urology

## 2017-02-27 NOTE — Progress Notes (Signed)
Patient ID: Lance Oneal, male    DOB: 06/09/1951  Age: 66 y.o. MRN: [id number]    Subjective:  Subjective  HPI Lance Oneal presents for f/u bp, and cholesterol.  He c/o L testicular pain and R testicle is bigger--- he is requesting we check his prostate today and he needs f/u with urology.  Otherwise no complaints  Review of Systems  Constitutional: Negative for appetite change, diaphoresis, fatigue and unexpected weight change.  Eyes: Negative for pain, redness and visual disturbance.  Respiratory: Negative for cough, chest tightness, shortness of breath and wheezing.   Cardiovascular: Negative for chest pain, palpitations and leg swelling.  Endocrine: Negative for cold intolerance, heat intolerance, polydipsia, polyphagia and polyuria.  Genitourinary: Positive for scrotal swelling, testicular pain and urgency. Negative for difficulty urinating, dysuria and frequency.  Neurological: Negative for dizziness, light-headedness, numbness and headaches.    History Past Medical History:  Diagnosis Date  . Hyperlipidemia   . Hypertension     He has a past surgical history that includes Hernia repair.   His family history includes Hypertension in his mother; Liver cancer in his father and unknown relative.He reports that he quit smoking about 12 years ago. His smoking use included Cigarettes. He smoked 0.50 packs per day. He has never used smokeless tobacco. He reports that he drinks alcohol. He reports that he does not use drugs.  Current Outpatient Prescriptions on File Prior to Visit  Medication Sig Dispense Refill  . amoxicillin-clavulanate (AUGMENTIN) 875-125 MG tablet Take 1 tablet by mouth 2 (two) times daily. 20 tablet 0  . traMADol (ULTRAM) 50 MG tablet Take 1 tablet (50 mg total) by mouth every 8 (eight) hours as needed. 30 tablet 0   No current facility-administered medications on file prior to visit.      Objective:  Objective  Physical Exam  Constitutional: He is oriented to  person, place, and time. Vital signs are normal. He appears well-developed and well-nourished. He is sleeping.  HENT:  Head: Normocephalic and atraumatic.  Mouth/Throat: Oropharynx is clear and moist.  Eyes: Pupils are equal, round, and reactive to light. EOM are normal.  Neck: Normal range of motion. Neck supple. No thyromegaly present.  Cardiovascular: Normal rate and regular rhythm.   No murmur heard. Pulmonary/Chest: Effort normal and breath sounds normal. No respiratory distress. He has no wheezes. He has no rales. He exhibits no tenderness.  Genitourinary: Rectal exam shows guaiac negative stool. Prostate is enlarged. Prostate is not tender. Right testis shows swelling. Right testis shows no tenderness. Left testis shows tenderness. Left testis shows no swelling.  Musculoskeletal: He exhibits no edema or tenderness.  Neurological: He is alert and oriented to person, place, and time.  Skin: Skin is warm and dry.  Psychiatric: He has a normal mood and affect. His behavior is normal. Judgment and thought content normal.  Nursing note and vitals reviewed.  BP 110/70   Pulse 81   Wt 116 lb (52.6 kg)   SpO2 98%   BMI 22.28 kg/m  Wt Readings from Last 3 Encounters:  02/27/17 116 lb (52.6 kg)  09/06/16 124 lb 12.8 oz (56.6 kg)  03/03/16 118 lb 12.8 oz (53.9 kg)     Lab Results  Component Value Date   WBC 9.0 09/06/2016   HGB 14.0 09/06/2016   HCT 41.7 09/06/2016   PLT 331.0 09/06/2016   GLUCOSE 88 09/06/2016   CHOL 171 09/06/2016   TRIG 256.0 (H) 09/06/2016   HDL 40.70 09/06/2016   LDLDIRECT  101.0 09/06/2016   LDLCALC 171 (H) 03/08/2016   ALT 17 09/06/2016   AST 19 09/06/2016   NA 142 09/06/2016   K 4.4 09/06/2016   CL 104 09/06/2016   CREATININE 1.03 09/06/2016   BUN 24 (H) 09/06/2016   CO2 32 09/06/2016   TSH 0.59 09/06/2016   PSA 0.75 09/06/2016    Dg Chest 2 View  Result Date: 09/06/2016 CLINICAL DATA:  Two weeks of cough. History of hypertension,  hyperlipidemia, former smoker. EXAM: CHEST  2 VIEW COMPARISON:  Chest x-ray of Nov 25, 2012 FINDINGS: The lungs are mildly hyperinflated with hemidiaphragm flattening. There is no infiltrate, atelectasis, or pleural effusion. The heart and pulmonary vascularity are normal. The mediastinum is normal in width. There is calcification in the wall of the aortic arch. The bony thorax exhibits no acute abnormality. Hyperinflation consistent with COPD or reactive airway disease. No pneumonia, CHF, nor other acute cardiopulmonary abnormality. Thoracic aortic atherosclerosis. IMPRESSION: No active cardiopulmonary disease. Electronically Signed   By: David  [place] M.D.   On: 09/06/2016 15:00     Assessment & Plan:  Plan  I am having Mr. Axline start on ciprofloxacin. I am also having him maintain his traMADol, amoxicillin-clavulanate, atorvastatin, hydrochlorothiazide, losartan, fluticasone, finasteride, and levocetirizine.  Meds ordered this encounter  Medications  . atorvastatin (LIPITOR) 20 MG tablet    Sig: Take 1 tablet (20 mg total) by mouth at bedtime.    Dispense:  90 tablet    Refill:  1  . hydrochlorothiazide (HYDRODIURIL) 25 MG tablet    Sig: Take 1 tablet (25 mg total) by mouth daily.    Dispense:  90 tablet    Refill:  1  . losartan (COZAAR) 50 MG tablet    Sig: Take 1 tablet (50 mg total) by mouth daily.    Dispense:  90 tablet    Refill:  1  . fluticasone (FLONASE) 50 MCG/ACT nasal spray    Sig: Place 2 sprays into both nostrils daily.    Dispense:  16 g    Refill:  6  . finasteride (PROSCAR) 5 MG tablet    Sig: Take 1 tablet (5 mg total) by mouth daily.    Dispense:  90 tablet    Refill:  1  . levocetirizine (XYZAL) 5 MG tablet    Sig: Take 1 tablet (5 mg total) by mouth every evening.    Dispense:  90 tablet    Refill:  3  . ciprofloxacin (CIPRO) 500 MG tablet    Sig: Take 1 tablet (500 mg total) by mouth 2 (two) times daily.    Dispense:  20 tablet    Refill:  0    Problem  List Items Addressed This Visit      Unprioritized   History of BPH   Relevant Medications   finasteride (PROSCAR) 5 MG tablet   Other Relevant Orders   PSA   Benign prostatic hyperplasia (BPH) with urinary urgency - Primary    Symptoms worse  F/u urology      Relevant Medications   finasteride (PROSCAR) 5 MG tablet   ciprofloxacin (CIPRO) 500 MG tablet   Other Relevant Orders   Ambulatory referral to Urology   POCT occult blood stool (Completed)   POCT Urinalysis Dipstick (Automated)   Essential hypertension    Well controlled, no changes to meds. Encouraged heart healthy diet such as the DASH diet and exercise as tolerated.       Relevant Medications   atorvastatin (LIPITOR)  20 MG tablet   hydrochlorothiazide (HYDRODIURIL) 25 MG tablet   losartan (COZAAR) 50 MG tablet   Other Relevant Orders   Lipid panel   Comprehensive metabolic panel   Hyperlipidemia    Tolerating statin, encouraged heart healthy diet, avoid trans fats, minimize simple carbs and saturated fats. Increase exercise as tolerated      Relevant Medications   atorvastatin (LIPITOR) 20 MG tablet   hydrochlorothiazide (HYDRODIURIL) 25 MG tablet   losartan (COZAAR) 50 MG tablet   Other Relevant Orders   Lipid panel   Comprehensive metabolic panel   Pain in left testicle    New problem Check labs cipro Refer to urology  R testicle enlarged --- non tender      Relevant Medications   ciprofloxacin (CIPRO) 500 MG tablet   Other Relevant Orders   POCT Urinalysis Dipstick (Automated)    Other Visit Diagnoses    Seasonal allergic rhinitis due to pollen       Relevant Medications   fluticasone (FLONASE) 50 MCG/ACT nasal spray   levocetirizine (XYZAL) 5 MG tablet      Follow-up: Return in about 6 months (around 08/30/2017) for annual exam, fasting.  Donato Schultz, DO

## 2017-02-27 NOTE — Patient Instructions (Signed)

## 2017-02-27 NOTE — Assessment & Plan Note (Signed)
Tolerating statin, encouraged heart healthy diet, avoid trans fats, minimize simple carbs and saturated fats. Increase exercise as tolerated 

## 2017-02-27 NOTE — Assessment & Plan Note (Signed)
Well controlled, no changes to meds. Encouraged heart healthy diet such as the DASH diet and exercise as tolerated.  °

## 2017-02-28 LAB — LIPID PANEL
CHOLESTEROL: 121 mg/dL (ref 0–200)
HDL: 41.7 mg/dL (ref >39.00)
LDL Cholesterol: 50 mg/dL (ref 0–99)
NonHDL: 79.69
TRIGLYCERIDES: 147 mg/dL (ref 0.0–149.0)
Total CHOL/HDL Ratio: 3
VLDL: 29.4 mg/dL (ref 0.0–40.0)

## 2017-02-28 LAB — COMPREHENSIVE METABOLIC PANEL
ALBUMIN: 4 g/dL (ref 3.5–5.2)
ALK PHOS: 52 U/L (ref 39–117)
ALT: 17 U/L (ref 0–53)
AST: 19 U/L (ref 0–37)
BILIRUBIN TOTAL: 0.9 mg/dL (ref 0.2–1.2)
BUN: 21 mg/dL (ref 6–23)
CO2: 32 mEq/L (ref 19–32)
Calcium: 9.4 mg/dL (ref 8.4–10.5)
Chloride: 105 mEq/L (ref 96–112)
Creatinine, Ser: 1.06 mg/dL (ref 0.40–1.50)
GFR: 74.33 mL/min (ref >60.00)
Glucose, Bld: 106 mg/dL — ABNORMAL HIGH (ref 70–99)
POTASSIUM: 4.1 meq/L (ref 3.5–5.1)
Sodium: 143 mEq/L (ref 135–145)
TOTAL PROTEIN: 6.9 g/dL (ref 6.0–8.3)

## 2017-02-28 LAB — PSA: PSA: 0.74 ng/mL (ref 0.10–4.00)

## 2017-03-07 ENCOUNTER — Telehealth: Payer: Self-pay

## 2017-03-07 NOTE — Telephone Encounter (Signed)
Referral message sent to [facility], awaiting to hear status of referral

## 2017-03-07 NOTE — Telephone Encounter (Signed)
Can you check status of urology referral placed on 02/27/2017- Pt has not received telephone call. Thank you.

## 2017-03-16 NOTE — Telephone Encounter (Signed)
Pt scheduled w/ Dr. Berneice HeinrichManny on 03/21/2017.

## 2017-04-04 ENCOUNTER — Ambulatory Visit: Payer: BLUE CROSS/BLUE SHIELD | Admitting: Family Medicine

## 2017-09-04 ENCOUNTER — Ambulatory Visit: Payer: BLUE CROSS/BLUE SHIELD | Admitting: Family Medicine

## 2017-09-04 ENCOUNTER — Encounter: Payer: Self-pay | Admitting: Family Medicine

## 2017-09-04 VITALS — BP 98/70 | HR 83 | Temp 98.0°F | Resp 16 | Ht 60.0 in | Wt 122.4 lb

## 2017-09-04 DIAGNOSIS — R059 Cough, unspecified: Secondary | ICD-10-CM

## 2017-09-04 DIAGNOSIS — I10 Essential (primary) hypertension: Secondary | ICD-10-CM | POA: Diagnosis not present

## 2017-09-04 DIAGNOSIS — J324 Chronic pansinusitis: Secondary | ICD-10-CM

## 2017-09-04 DIAGNOSIS — R05 Cough: Secondary | ICD-10-CM

## 2017-09-04 DIAGNOSIS — E785 Hyperlipidemia, unspecified: Secondary | ICD-10-CM

## 2017-09-04 DIAGNOSIS — Z87438 Personal history of other diseases of male genital organs: Secondary | ICD-10-CM | POA: Diagnosis not present

## 2017-09-04 DIAGNOSIS — K047 Periapical abscess without sinus: Secondary | ICD-10-CM | POA: Diagnosis not present

## 2017-09-04 DIAGNOSIS — H66002 Acute suppurative otitis media without spontaneous rupture of ear drum, left ear: Secondary | ICD-10-CM

## 2017-09-04 DIAGNOSIS — J301 Allergic rhinitis due to pollen: Secondary | ICD-10-CM

## 2017-09-04 MED ORDER — HYDROCHLOROTHIAZIDE 25 MG PO TABS: 25.0000 mg | ORAL_TABLET | Freq: Every day | ORAL | 1 refills | Status: DC

## 2017-09-04 MED ORDER — TRAMADOL HCL 50 MG PO TABS: 50.0000 mg | ORAL_TABLET | Freq: Three times a day (TID) | ORAL | 0 refills | Status: DC | PRN

## 2017-09-04 MED ORDER — FLUTICASONE PROPIONATE 50 MCG/ACT NA SUSP: 2.0000 | Freq: Every day | NASAL | 6 refills | Status: DC

## 2017-09-04 MED ORDER — LOSARTAN POTASSIUM 50 MG PO TABS: 50.0000 mg | ORAL_TABLET | Freq: Every day | ORAL | 1 refills | Status: DC

## 2017-09-04 MED ORDER — FINASTERIDE 5 MG PO TABS: 5.0000 mg | ORAL_TABLET | Freq: Every day | ORAL | 1 refills | Status: DC

## 2017-09-04 MED ORDER — AMOXICILLIN-POT CLAVULANATE 875-125 MG PO TABS: 1.0000 | ORAL_TABLET | Freq: Two times a day (BID) | ORAL | 0 refills | Status: DC

## 2017-09-04 MED ORDER — LEVOCETIRIZINE DIHYDROCHLORIDE 5 MG PO TABS: 5.0000 mg | ORAL_TABLET | Freq: Every evening | ORAL | 3 refills | Status: DC

## 2017-09-04 MED ORDER — PROMETHAZINE-DM 6.25-15 MG/5ML PO SYRP: 5.0000 mL | ORAL_SOLUTION | Freq: Four times a day (QID) | ORAL | 0 refills | Status: DC | PRN

## 2017-09-04 MED ORDER — ATORVASTATIN CALCIUM 20 MG PO TABS: 20.0000 mg | ORAL_TABLET | Freq: Every day | ORAL | 1 refills | Status: DC

## 2017-09-04 NOTE — Progress Notes (Signed)
Patient ID: Lance Oneal, male    DOB: [birth year]  Age: 67 y.o. MRN: [id number]    Subjective:  Subjective  HPI Lance Oneal presents for f/u htn and cholesterol.  He also c/o cough / congestion x 1 week.  + sore throat, fever, chills.    He has taken advil and xyzal with little relief.     Review of Systems  Constitutional: Positive for chills and fever.  HENT: Positive for congestion, postnasal drip and rhinorrhea. Negative for sinus pressure.   Respiratory: Positive for cough. Negative for chest tightness, shortness of breath and wheezing.   Cardiovascular: Negative for chest pain, palpitations and leg swelling.  Allergic/Immunologic: Negative for environmental allergies.    History Past Medical History:  Diagnosis Date  . Hyperlipidemia   . Hypertension     He has a past surgical history that includes Hernia repair.   His family history includes Hypertension in his mother; Liver cancer in his father and unknown relative.He reports that he quit smoking about 12 years ago. His smoking use included cigarettes. He smoked 0.50 packs per day. he has never used smokeless tobacco. He reports that he drinks alcohol. He reports that he does not use drugs.  No current outpatient medications on file prior to visit.   No current facility-administered medications on file prior to visit.      Objective:  Objective  Physical Exam  Constitutional: He is oriented to person, place, and time. He appears well-developed and well-nourished.  HENT:  Right Ear: External ear normal.  Left Ear: External ear normal. Tympanic membrane is injected and erythematous. Tympanic membrane is not perforated, not retracted and not bulging.  + PND + errythema  Eyes: Conjunctivae are normal. Right eye exhibits no discharge. Left eye exhibits no discharge.  Cardiovascular: Normal rate, regular rhythm and normal heart sounds.  No murmur heard. Pulmonary/Chest: Effort normal and breath sounds normal. No respiratory  distress. He has no wheezes. He has no rales. He exhibits no tenderness.  Musculoskeletal: He exhibits no edema.  Lymphadenopathy:    He has cervical adenopathy.  Neurological: He is alert and oriented to person, place, and time.  Nursing note and vitals reviewed.  BP 98/70 (BP Location: Left Arm, Cuff Size: Normal)   Pulse 83   Temp 98 F (36.7 C) (Oral)   Resp 16   Ht 5' (1.524 m)   Wt 122 lb 6.4 oz (55.5 kg)   SpO2 97%   BMI 23.90 kg/m  Wt Readings from Last 3 Encounters:  09/04/17 122 lb 6.4 oz (55.5 kg)  02/27/17 116 lb (52.6 kg)  09/06/16 124 lb 12.8 oz (56.6 kg)     Lab Results  Component Value Date   WBC 9.0 09/06/2016   HGB 14.0 09/06/2016   HCT 41.7 09/06/2016   PLT 331.0 09/06/2016   GLUCOSE 106 (H) 02/27/2017   CHOL 121 02/27/2017   TRIG 147.0 02/27/2017   HDL 41.70 02/27/2017   LDLDIRECT 101.0 09/06/2016   LDLCALC 50 02/27/2017   ALT 17 02/27/2017   AST 19 02/27/2017   NA 143 02/27/2017   K 4.1 02/27/2017   CL 105 02/27/2017   CREATININE 1.06 02/27/2017   BUN 21 02/27/2017   CO2 32 02/27/2017   TSH 0.59 09/06/2016   PSA 0.74 02/27/2017    Dg Chest 2 View  Result Date: 09/06/2016 CLINICAL DATA:  Two weeks of cough. History of hypertension, hyperlipidemia, former smoker. EXAM: CHEST  2 VIEW COMPARISON:  Chest x-ray of Nov 25, 2012 FINDINGS: The lungs are mildly hyperinflated with hemidiaphragm flattening. There is no infiltrate, atelectasis, or pleural effusion. The heart and pulmonary vascularity are normal. The mediastinum is normal in width. There is calcification in the wall of the aortic arch. The bony thorax exhibits no acute abnormality. Hyperinflation consistent with COPD or reactive airway disease. No pneumonia, CHF, nor other acute cardiopulmonary abnormality. Thoracic aortic atherosclerosis. IMPRESSION: No active cardiopulmonary disease. Electronically Signed   By: David  [place] M.D.   On: 09/06/2016 15:00     Assessment & Plan:  Plan  I  have discontinued Demorio Latino's ciprofloxacin. I am also having him start on promethazine-dextromethorphan. Additionally, I am having him maintain his traMADol, losartan, levocetirizine, hydrochlorothiazide, fluticasone, finasteride, atorvastatin, and amoxicillin-clavulanate.  Meds ordered this encounter  Medications  . traMADol (ULTRAM) 50 MG tablet    Sig: Take 1 tablet (50 mg total) by mouth every 8 (eight) hours as needed.    Dispense:  30 tablet    Refill:  0  . losartan (COZAAR) 50 MG tablet    Sig: Take 1 tablet (50 mg total) by mouth daily.    Dispense:  90 tablet    Refill:  1  . levocetirizine (XYZAL) 5 MG tablet    Sig: Take 1 tablet (5 mg total) by mouth every evening.    Dispense:  90 tablet    Refill:  3  . hydrochlorothiazide (HYDRODIURIL) 25 MG tablet    Sig: Take 1 tablet (25 mg total) by mouth daily.    Dispense:  90 tablet    Refill:  1  . fluticasone (FLONASE) 50 MCG/ACT nasal spray    Sig: Place 2 sprays into both nostrils daily.    Dispense:  16 g    Refill:  6  . finasteride (PROSCAR) 5 MG tablet    Sig: Take 1 tablet (5 mg total) by mouth daily.    Dispense:  90 tablet    Refill:  1  . atorvastatin (LIPITOR) 20 MG tablet    Sig: Take 1 tablet (20 mg total) by mouth at bedtime.    Dispense:  90 tablet    Refill:  1  . amoxicillin-clavulanate (AUGMENTIN) 875-125 MG tablet    Sig: Take 1 tablet by mouth 2 (two) times daily.    Dispense:  20 tablet    Refill:  0  . promethazine-dextromethorphan (PROMETHAZINE-DM) 6.25-15 MG/5ML syrup    Sig: Take 5 mLs by mouth 4 (four) times daily as needed.    Dispense:  118 mL    Refill:  0   1. Essential hypertension Well controlled, no changes to meds. Encouraged heart healthy diet such as the DASH diet and exercise as tolerated.  - Comprehensive metabolic panel - Lipid panel - losartan (COZAAR) 50 MG tablet; Take 1 tablet (50 mg total) by mouth daily.  Dispense: 90 tablet; Refill: 1 - hydrochlorothiazide (HYDRODIURIL)  25 MG tablet; Take 1 tablet (25 mg total) by mouth daily.  Dispense: 90 tablet; Refill: 1  2. Seasonal allergic rhinitis due to pollen  - levocetirizine (XYZAL) 5 MG tablet; Take 1 tablet (5 mg total) by mouth every evening.  Dispense: 90 tablet; Refill: 3 - fluticasone (FLONASE) 50 MCG/ACT nasal spray; Place 2 sprays into both nostrils daily.  Dispense: 16 g; Refill: 6  3. History of BPH  - finasteride (PROSCAR) 5 MG tablet; Take 1 tablet (5 mg total) by mouth daily.  Dispense: 90 tablet; Refill: 1  4. Hyperlipidemia, unspecified hyperlipidemia type Tolerating  statin, encouraged heart healthy diet, avoid trans fats, minimize simple carbs and saturated fats. Increase exercise as tolerated - Comprehensive metabolic panel - Lipid panel - atorvastatin (LIPITOR) 20 MG tablet; Take 1 tablet (20 mg total) by mouth at bedtime.  Dispense: 90 tablet; Refill: 1   -5 Acute suppurative otitis media of left ear without spontaneous rupture of tympanic membrane, recurrence not specified abx per orders  - promethazine-dextromethorphan (PROMETHAZINE-DM) 6.25-15 MG/5ML syrup; Take 5 mLs by mouth 4 (four) times daily as needed.  Dispense: 118 mL; Refill: 0  7. Cough   - promethazine-dextromethorphan (PROMETHAZINE-DM) 6.25-15 MG/5ML syrup; Take 5 mLs by mouth 4 (four) times daily as needed.  Dispense: 118 mL; Refill: 0    Follow-up: Return in about 6 months (around 03/04/2018), or if symptoms worsen or fail to improve, for annual exam, fasting.  Donato Schultz, DO

## 2017-09-04 NOTE — Patient Instructions (Signed)
Otitis Media, Adult Otitis media is redness, soreness, and puffiness (swelling) in the space just behind your eardrum (middle ear). It may be caused by allergies or infection. It often happens along with a cold. Follow these instructions at home:  Take your medicine as told. Finish it even if you start to feel better.  Only take over-the-counter or prescription medicines for pain, discomfort, or fever as told by your doctor.  Follow up with your doctor as told. Contact a doctor if:  You have otitis media only in one ear, or bleeding from your nose, or both.  You notice a lump on your neck.  You are not getting better in 3-5 days.  You feel worse instead of better. Get help right away if:  You have pain that is not helped with medicine.  You have puffiness, redness, or pain around your ear.  You get a stiff neck.  You cannot move part of your face (paralysis).  You notice that the bone behind your ear hurts when you touch it. This information is not intended to replace advice given to you by your health care provider. Make sure you discuss any questions you have with your health care provider. Document Released: 12/14/2007 Document Revised: 12/03/2015 Document Reviewed: 01/22/2013 Elsevier Interactive Patient Education  2017 [employer]  

## 2017-09-05 LAB — LIPID PANEL
CHOL/HDL RATIO: 3
Cholesterol: 144 mg/dL (ref 0–200)
HDL: 47.1 mg/dL (ref >39.00)
LDL CALC: 74 mg/dL (ref 0–99)
NonHDL: 96.67
Triglycerides: 112 mg/dL (ref 0.0–149.0)
VLDL: 22.4 mg/dL (ref 0.0–40.0)

## 2017-09-05 LAB — COMPREHENSIVE METABOLIC PANEL
ALT: 29 U/L (ref 0–53)
AST: 27 U/L (ref 0–37)
Albumin: 3.9 g/dL (ref 3.5–5.2)
Alkaline Phosphatase: 60 U/L (ref 39–117)
BUN: 23 mg/dL (ref 6–23)
CO2: 32 meq/L (ref 19–32)
Calcium: 9.1 mg/dL (ref 8.4–10.5)
Chloride: 101 mEq/L (ref 96–112)
Creatinine, Ser: 1.16 mg/dL (ref 0.40–1.50)
GFR: 66.88 mL/min (ref >60.00)
GLUCOSE: 72 mg/dL (ref 70–99)
POTASSIUM: 3.5 meq/L (ref 3.5–5.1)
SODIUM: 139 meq/L (ref 135–145)
Total Bilirubin: 0.9 mg/dL (ref 0.2–1.2)
Total Protein: 7.1 g/dL (ref 6.0–8.3)

## 2017-09-08 ENCOUNTER — Encounter: Payer: Self-pay | Admitting: *Deleted

## 2018-03-07 DIAGNOSIS — R69 Illness, unspecified: Secondary | ICD-10-CM | POA: Diagnosis not present

## 2018-03-13 ENCOUNTER — Encounter: Payer: Self-pay | Admitting: *Deleted

## 2018-04-09 ENCOUNTER — Ambulatory Visit: Payer: BLUE CROSS/BLUE SHIELD | Admitting: Family Medicine

## 2018-04-09 ENCOUNTER — Ambulatory Visit (INDEPENDENT_AMBULATORY_CARE_PROVIDER_SITE_OTHER): Payer: Medicare HMO | Admitting: Family Medicine

## 2018-04-09 ENCOUNTER — Encounter: Payer: Self-pay | Admitting: Family Medicine

## 2018-04-09 VITALS — BP 118/66 | HR 62 | Temp 98.1°F | Resp 16 | Ht 60.0 in | Wt 117.0 lb

## 2018-04-09 DIAGNOSIS — I10 Essential (primary) hypertension: Secondary | ICD-10-CM

## 2018-04-09 DIAGNOSIS — Z23 Encounter for immunization: Secondary | ICD-10-CM | POA: Diagnosis not present

## 2018-04-09 DIAGNOSIS — R1013 Epigastric pain: Secondary | ICD-10-CM | POA: Diagnosis not present

## 2018-04-09 DIAGNOSIS — E785 Hyperlipidemia, unspecified: Secondary | ICD-10-CM

## 2018-04-09 DIAGNOSIS — J301 Allergic rhinitis due to pollen: Secondary | ICD-10-CM | POA: Diagnosis not present

## 2018-04-09 DIAGNOSIS — Z87438 Personal history of other diseases of male genital organs: Secondary | ICD-10-CM | POA: Diagnosis not present

## 2018-04-09 LAB — COMPREHENSIVE METABOLIC PANEL
ALT: 20 U/L (ref 0–53)
AST: 23 U/L (ref 0–37)
Albumin: 4.3 g/dL (ref 3.5–5.2)
Alkaline Phosphatase: 51 U/L (ref 39–117)
BUN: 22 mg/dL (ref 6–23)
CO2: 30 mEq/L (ref 19–32)
CREATININE: 0.9 mg/dL (ref 0.40–1.50)
Calcium: 9 mg/dL (ref 8.4–10.5)
Chloride: 103 mEq/L (ref 96–112)
GFR: 89.48 mL/min (ref >60.00)
GLUCOSE: 79 mg/dL (ref 70–99)
POTASSIUM: 4.2 meq/L (ref 3.5–5.1)
SODIUM: 140 meq/L (ref 135–145)
TOTAL PROTEIN: 6.8 g/dL (ref 6.0–8.3)
Total Bilirubin: 1.4 mg/dL — ABNORMAL HIGH (ref 0.2–1.2)

## 2018-04-09 LAB — LIPID PANEL
Cholesterol: 139 mg/dL (ref 0–200)
HDL: 43.5 mg/dL
LDL Cholesterol: 77 mg/dL (ref 0–99)
NonHDL: 95.5
Total CHOL/HDL Ratio: 3
Triglycerides: 94 mg/dL (ref 0.0–149.0)
VLDL: 18.8 mg/dL (ref 0.0–40.0)

## 2018-04-09 LAB — H. PYLORI ANTIBODY, IGG: H Pylori IgG: POSITIVE — AB

## 2018-04-09 MED ORDER — FINASTERIDE 5 MG PO TABS: 5.0000 mg | ORAL_TABLET | Freq: Every day | ORAL | 1 refills | Status: DC

## 2018-04-09 MED ORDER — PANTOPRAZOLE SODIUM 40 MG PO TBEC: 40.0000 mg | DELAYED_RELEASE_TABLET | Freq: Every day | ORAL | 3 refills | Status: DC

## 2018-04-09 MED ORDER — HYDROCHLOROTHIAZIDE 25 MG PO TABS: 25.0000 mg | ORAL_TABLET | Freq: Every day | ORAL | 1 refills | Status: DC

## 2018-04-09 MED ORDER — ATORVASTATIN CALCIUM 20 MG PO TABS: 20.0000 mg | ORAL_TABLET | Freq: Every day | ORAL | 1 refills | Status: DC

## 2018-04-09 MED ORDER — LOSARTAN POTASSIUM 50 MG PO TABS: 50.0000 mg | ORAL_TABLET | Freq: Every day | ORAL | 1 refills | Status: DC

## 2018-04-09 MED ORDER — FLUTICASONE PROPIONATE 50 MCG/ACT NA SUSP: 2.0000 | Freq: Every day | NASAL | 6 refills | Status: DC

## 2018-04-09 NOTE — Assessment & Plan Note (Signed)
Tolerating statin, encouraged heart healthy diet, avoid trans fats, minimize simple carbs and saturated fats. Increase exercise as tolerated 

## 2018-04-09 NOTE — Progress Notes (Signed)
Patient ID: Lance Oneal, male    DOB: 18-Jun-1951  Age: 67 y.o. MRN: [id number]    Subjective:  Subjective  HPI Lance Oneal presents for f/u bp and lipids.  [name] only at night b/l-- no cp , no sob, no palpitations  Translator is present-- he states it occurs mostly at night after a heavy meal and reflux also goes with it  Review of Systems  Constitutional: Negative for appetite change, diaphoresis, fatigue and unexpected weight change.  Eyes: Negative for pain, redness and visual disturbance.  Respiratory: Negative for cough, chest tightness, shortness of breath and wheezing.   Cardiovascular: Negative for chest pain, palpitations and leg swelling.  Gastrointestinal: Negative for nausea and vomiting.  Endocrine: Negative for cold intolerance, heat intolerance, polydipsia, polyphagia and polyuria.  Genitourinary: Negative for difficulty urinating, dysuria and frequency.  Musculoskeletal: Positive for myalgias. Negative for arthralgias.  Neurological: Negative for dizziness, light-headedness, numbness and headaches.    History Past Medical History:  Diagnosis Date  . Hyperlipidemia   . Hypertension     He has a past surgical history that includes Hernia repair.   His family history includes Hypertension in his mother; Liver cancer in his father and unknown relative.He reports that he quit smoking about 13 years ago. His smoking use included cigarettes. He smoked 0.50 packs per day. He has never used smokeless tobacco. He reports that he drinks alcohol. He reports that he does not use drugs.  Current Outpatient Medications on File Prior to Visit  Medication Sig Dispense Refill  . levocetirizine (XYZAL) 5 MG tablet Take 1 tablet (5 mg total) by mouth every evening. 90 tablet 3   No current facility-administered medications on file prior to visit.      Objective:  Objective  Physical Exam  Constitutional: He is oriented to person, place, and time. Vital signs are normal. He  appears well-developed and well-nourished. He is sleeping.  HENT:  Head: Normocephalic and atraumatic.  Mouth/Throat: Oropharynx is clear and moist.  Eyes: Pupils are equal, round, and reactive to light. EOM are normal.  Neck: Normal range of motion. Neck supple. No thyromegaly present.  Cardiovascular: Normal rate and regular rhythm.  No murmur heard. Pulmonary/Chest: Effort normal and breath sounds normal. No respiratory distress. He has no wheezes. He has no rales. He exhibits no tenderness.  Abdominal: Soft. There is no tenderness.  Musculoskeletal: He exhibits no edema or tenderness.  Neurological: He is alert and oriented to person, place, and time. He displays normal reflexes. He exhibits normal muscle tone.  Skin: Skin is warm and dry.  Psychiatric: He has a normal mood and affect. His behavior is normal. Judgment and thought content normal.  Nursing note and vitals reviewed.  BP 118/66 (BP Location: Right Arm, Cuff Size: Normal)   Pulse 62   Temp 98.1 F (36.7 C) (Oral)   Resp 16   Ht 5' (1.524 m)   Wt 117 lb (53.1 kg)   SpO2 100%   BMI 22.85 kg/m  Wt Readings from Last 3 Encounters:  04/09/18 117 lb (53.1 kg)  09/04/17 122 lb 6.4 oz (55.5 kg)  02/27/17 116 lb (52.6 kg)     Lab Results  Component Value Date   WBC 9.0 09/06/2016   HGB 14.0 09/06/2016   HCT 41.7 09/06/2016   PLT 331.0 09/06/2016   GLUCOSE 79 04/09/2018   CHOL 139 04/09/2018   TRIG 94.0 04/09/2018   HDL 43.50 04/09/2018   LDLDIRECT 101.0 09/06/2016  LDLCALC 77 04/09/2018   ALT 20 04/09/2018   AST 23 04/09/2018   NA 140 04/09/2018   K 4.2 04/09/2018   CL 103 04/09/2018   CREATININE 0.90 04/09/2018   BUN 22 04/09/2018   CO2 30 04/09/2018   TSH 0.59 09/06/2016   PSA 0.74 02/27/2017    Dg Chest 2 View  Result Date: 09/06/2016 CLINICAL DATA:  Two weeks of cough. History of hypertension, hyperlipidemia, former smoker. EXAM: CHEST  2 VIEW COMPARISON:  Chest x-ray of Nov 25, 2012 FINDINGS:  The lungs are mildly hyperinflated with hemidiaphragm flattening. There is no infiltrate, atelectasis, or pleural effusion. The heart and pulmonary vascularity are normal. The mediastinum is normal in width. There is calcification in the wall of the aortic arch. The bony thorax exhibits no acute abnormality. Hyperinflation consistent with COPD or reactive airway disease. No pneumonia, CHF, nor other acute cardiopulmonary abnormality. Thoracic aortic atherosclerosis. IMPRESSION: No active cardiopulmonary disease. Electronically Signed   By: David  [place] M.D.   On: 09/06/2016 15:00     Assessment & Plan:  Plan  I have discontinued Lance Oneal's traMADol, amoxicillin-clavulanate, and promethazine-dextromethorphan. I am also having him start on pantoprazole. Additionally, I am having him maintain his levocetirizine, losartan, finasteride, hydrochlorothiazide, atorvastatin, and fluticasone.  Meds ordered this encounter  Medications  . losartan (COZAAR) 50 MG tablet    Sig: Take 1 tablet (50 mg total) by mouth daily.    Dispense:  90 tablet    Refill:  1  . finasteride (PROSCAR) 5 MG tablet    Sig: Take 1 tablet (5 mg total) by mouth daily.    Dispense:  90 tablet    Refill:  1  . hydrochlorothiazide (HYDRODIURIL) 25 MG tablet    Sig: Take 1 tablet (25 mg total) by mouth daily.    Dispense:  90 tablet    Refill:  1  . atorvastatin (LIPITOR) 20 MG tablet    Sig: Take 1 tablet (20 mg total) by mouth at bedtime.    Dispense:  90 tablet    Refill:  1  . fluticasone (FLONASE) 50 MCG/ACT nasal spray    Sig: Place 2 sprays into both nostrils daily.    Dispense:  16 g    Refill:  6  . pantoprazole (PROTONIX) 40 MG tablet    Sig: Take 1 tablet (40 mg total) by mouth daily.    Dispense:  30 tablet    Refill:  3    Problem List Items Addressed This Visit      Unprioritized   Dyspepsia - Primary    Check labs protonix Consider GI      Relevant Medications   pantoprazole (PROTONIX) 40 MG tablet    Other Relevant Orders   H. pylori antibody, IgG (Completed)   Essential hypertension    Well controlled, no changes to meds. Encouraged heart healthy diet such as the DASH diet and exercise as tolerated.       Relevant Medications   losartan (COZAAR) 50 MG tablet   hydrochlorothiazide (HYDRODIURIL) 25 MG tablet   atorvastatin (LIPITOR) 20 MG tablet   Other Relevant Orders   Comprehensive metabolic panel (Completed)   Lipid panel (Completed)   History of BPH   Relevant Medications   finasteride (PROSCAR) 5 MG tablet   Hyperlipidemia    Tolerating statin, encouraged heart healthy diet, avoid trans fats, minimize simple carbs and saturated fats. Increase exercise as tolerated      Relevant Medications   losartan (COZAAR)  50 MG tablet   hydrochlorothiazide (HYDRODIURIL) 25 MG tablet   atorvastatin (LIPITOR) 20 MG tablet   Other Relevant Orders   Comprehensive metabolic panel (Completed)   Lipid panel (Completed)   Seasonal allergic rhinitis due to pollen   Relevant Medications   fluticasone (FLONASE) 50 MCG/ACT nasal spray    Other Visit Diagnoses    Need for pneumococcal vaccination       Relevant Orders   Pneumococcal polysaccharide vaccine 23-valent greater than or equal to 2yo subcutaneous/IM (Completed)      Follow-up: Return in about 6 months (around 10/08/2018) for fasting, annual exam.  Donato Schultz, DO

## 2018-04-09 NOTE — Patient Instructions (Addendum)
L?a Ch?n Th?c ?n cho B?nh Trào Ng??c D? Dày Th?c Qu?n, Ng??i L?n  Food Choices for Gastroesophageal Reflux Disease, Adult  Khi quý v? b? b?nh trào ng??c d? dày th?c qu?n (GERD), th?c ph?m quý v? ?n và thói quen ?n u?ng c?a quý v? là r?t quan tr?ng. L?a ch?n th?c ph?m ?úng có th? giúp làm gi?m s? khó ch?u c?a b?nh trào ng??c d? dày th?c qu?n (GERD). Cân nh?c làm vi?c v?i m?t chuyên gia v? ch? ?? ?n và dinh d??ng (chuyên gia dinh d??ng) nh?m giúp quý v? l?a ch?n nh?ng th?c ph?m t?t cho s?c kh?e.  Nh?ng h??ng d?n chung nào tôi ph?i tuân theo?  K? ho?ch ?n u?ng  · Ch?n nh?ng th?c ph?m t?t cho s?c kh?e ít ch?t béo, ch?ng h?n nh? trái cây, rau c?, ng? c?c nguyên h?t, các s?n ph?m t? s?a ít béo, và th?t n?c, cá, và th?t gia c?m.  · ?n các b?a nh?, th??ng xuyên thay vì ba b?a l?n m?i ngày. ?n ch?m rãi, trong không gian th? giãn. Tránh g?p ng??i ho?c n?m xu?ng cho ??n khi ?n xong ???c 2-3 gi?.  · H?n ch? các th?c ph?m giàu ch?t béo ch?ng h?n nh? th?t nhi?u m? ho?c th?c ph?m chiên.  · Gi?i h?n l??ng tiêu th? d?u, b?, và ch?t béo t? d?u th?c v?t ? m?c d??i 8 mu?ng cà phê m?i ngày.  · Tra?nh nh??ng th?? sau:  ? Các th?c ph?m gây ra các tri?u ch?ng. Nh?ng tri?u ch?ng này có th? khác nhau ??i v?i nh?ng ng??i khác nhau. Hãy ghi nh?t ký th?c ph?m ?? theo dõi nh?ng th?c ph?m nào gây ra các tri?u ch?ng.  ? R??u.  ? U?ng nhi?u ch?t l?ng khi ?n.  ? ?n trong kho?ng 2–3 gi? tr??c khi ?i ng?.  · N?u ?n b?ng các ph??ng pháp khác thay vi? chiên. Ph??ng pháp này có th? bao g?m b? lò, n??ng, ho?c hun nóng.  L?i s?ng    · Duy trì m??c cân n?ng có l?i cho s?c kh?e. H?i chuyên gia ch?m sóc s?c kh?e c?a quy? vi? xem m??c cân n??ng na?o la? tô?t cho quy? vi?. N?u quý v? c?n gi?m cân, hãy trao ??i v?i chuyên gia ch?m sóc s?c kh?e c?a quý v? ?? gi?m cân m?t cách an toàn.  · T?p th? d?c t?i thi?u 30 phút t? 5 ngày tr? lên m?i tu?n, ho?c theo ch? d?n c?a chuyên gia ch?m sóc s?c kh?e.  · Tránh m?c qu?n áo bó ch?t quanh th?t l?ng và ng?c.   · Không s? d?ng b?t k? s?n ph?m nào ch?a nicotine ho?c thu?c lá, ch?ng ha?n nh? thu?c lá d?ng hút và thu?c lá ?i?n t?. N?u quý v? c?n giúp ?? ?? cai thu?c, hãy h?i chuyên gia ch?m sóc s?c kh?e.  · Ng? v?i ??u gi??ng kê cao. S? d?ng chêm d??i n?m ho?c t?m kê d??i khung gi??ng ?? nâng ??u gi??ng lên.  Nh?ng lo?i th?c ?n nào không ???c khuyên dùng?  Nh?ng m?c ???c li?t kê có th? không ph?i danh sách ??y ??. Hãy trao ??i v?i bác s? chuyên khoa dinh d??ng xem các l?a ch?n ch? ?? dinh d??ng nào phù h?p nh?t v?i quý v?.  Ng? c?c  Bánh ng?t ho?c bánh mì n??ng nhanh b? sung ch?t béo. Bánh mì n??ng c?a Pháp.  Rau c?  Rau c? chiên ng?p d?u. Khoai tây chiên. M?i lo?i rau c? ???c ch? bi?n kèm thêm ch?t   béo. M?i lo?i rau c? gây ra các tri?u ch?ng. ??i v?i m?t s? ng??i, nh?ng lo?i rau c? này có th? bao g?m cà chua và các s?n ph?m t? cà chua, ?t, hành và t?i, và c?i ng?a.  Trái cây  M?i lo?i trái cây ???c ch? bi?n kèm thêm ch?t béo. M?i lo?i trái cây có th? gây ra các tri?u ch?ng. ??i v?i m?t s? ng??i, nh?ng lo?i trái cây này có th? bao g?m trái cây h? cam quýt, ch?ng h?n nh? cam, b??i, qu? d?a, và chanh.  Th?t và các th?c ph?m ch?a protein khác  Th?t giàu ch?t béo, ch?ng h?n nh? th?t l?n ho?c th?t bò nhi?u m?, xúc xích, s??n, gi?m bông, l?p x??ng, salami và th?t l?n mu?i xông khói. Th?t chiên ho?c protein, bao g?m cá chiên và th?t gà chiên. Qu? h?ch và b? h?t.  S?a.  S?a nguyên kem và s?a sô-cô-la. Kem chua. Kem. Kem. Pho mát kem. S?a l?c.  ?? u?ng  Cà phê và trà, có ho?c không có caffeine. ?? u?ng có ga. Soda. ?? u?ng t?ng l?c. N??c ép trái cây làm t? trái cây chua (ch?ng h?n nh? cam ho?c b??i). N??c ép cà chua. ?? u?ng ch?a c?n.  M? và d?u  B?. B? th?c v?t. Ch?t béo t? d?u th?c v?t. B? s??a trâu.  K?o và ?? tráng mi?ng  Sô-cô-la và cô ca. Bánh rán.  Gia v? và các th?c ph?m khác.  H?t tiêu. B?c hà và b?c hà l?c. M?i lo?i gia v?, th?o d??c, ho?c gia v? gây ra các tri?u ch?ng. ??i v?i m?t s? ng??i, gia v? và các th?c ph?m khác  có th? bao g?m cà ri, n??c s?t nóng, ho?c d?u d?m ?? tr?n salad.  Tóm t?t  · Khi quý v? b? b?nh trào ng??c d? dày th?c qu?n (GERD), các l?a ch?n th?c ph?m và l?i s?ng có vai trò r?t quan tr?ng ?? giúp làm gi?m s? khó ch?u c?a GERD.  · ?n các b?a nh?, th??ng xuyên thay vì ba b?a l?n m?i ngày. ?n ch?m rãi, trong không gian th? giãn. Tránh g?p ng??i ho?c n?m xu?ng cho ??n khi ?n xong ???c 2-3 gi?.  · H?n ch? các th?c ph?m giàu ch?t béo, ch?ng h?n nh? th?t m? ho?c th?c ph?m chiên.  Thông tin này không nh?m m?c ?ích thay th? cho l?i khuyên mà chuyên gia ch?m sóc s?c kh?e nói v?i quý v?. Hãy b?o ??m quý v? ph?i th?o lu?n b?t k? v?n ?? gì mà quý v? có v?i chuyên gia ch?m sóc s?c kh?e c?a quý v?.  Document Released: 10/14/2016 Document Revised: 10/14/2016  Elsevier Interactive Patient Education © 2018 [employer]

## 2018-04-09 NOTE — Assessment & Plan Note (Signed)
Well controlled, no changes to meds. Encouraged heart healthy diet such as the DASH diet and exercise as tolerated.  °

## 2018-04-09 NOTE — Assessment & Plan Note (Signed)
Check labs protonix Consider GI

## 2018-04-17 ENCOUNTER — Other Ambulatory Visit: Payer: Self-pay | Admitting: Family Medicine

## 2018-04-17 DIAGNOSIS — I10 Essential (primary) hypertension: Secondary | ICD-10-CM

## 2018-04-19 ENCOUNTER — Other Ambulatory Visit: Payer: Self-pay | Admitting: *Deleted

## 2018-04-19 MED ORDER — PANTOPRAZOLE SODIUM 40 MG PO TBEC: 40.0000 mg | DELAYED_RELEASE_TABLET | Freq: Two times a day (BID) | ORAL | 0 refills | Status: DC

## 2018-04-19 MED ORDER — AMOXICILLIN 500 MG PO CAPS: 1000.0000 mg | ORAL_CAPSULE | Freq: Two times a day (BID) | ORAL | 0 refills | Status: DC

## 2018-04-19 MED ORDER — CLARITHROMYCIN 500 MG PO TABS: 500.0000 mg | ORAL_TABLET | Freq: Two times a day (BID) | ORAL | 0 refills | Status: DC

## 2018-04-19 NOTE — Telephone Encounter (Signed)
Losartan 50mg  on back order, okay to double Losartan 25mg ?

## 2018-04-22 NOTE — Telephone Encounter (Signed)
yes

## 2018-04-23 MED ORDER — LOSARTAN POTASSIUM 25 MG PO TABS: 50.0000 mg | ORAL_TABLET | Freq: Every day | ORAL | 1 refills | Status: DC

## 2018-04-23 NOTE — Addendum Note (Signed)
Addended by: Scharlene Gloss B on: 04/23/2018 03:14 PM   Modules accepted: Orders

## 2018-04-26 ENCOUNTER — Telehealth: Payer: Self-pay | Admitting: *Deleted

## 2018-04-26 DIAGNOSIS — R1013 Epigastric pain: Secondary | ICD-10-CM

## 2018-04-26 MED ORDER — METRONIDAZOLE 500 MG PO TABS: 500.0000 mg | ORAL_TABLET | Freq: Three times a day (TID) | ORAL | 0 refills | Status: DC

## 2018-04-26 NOTE — Telephone Encounter (Signed)
Change amoxil to metronidazole 500 tid x 2 weeks and GI referral if not put in

## 2018-04-26 NOTE — Telephone Encounter (Signed)
Patient notified to stop amoxil and to start flagyl and that we will send him to GI.

## 2018-04-26 NOTE — Telephone Encounter (Signed)
Patient came into office stating that medication given for H. Pylori is causing diarrhea and making his heart beat fast when he takes it as directed.  He states that he thinks he is being overdosed.  He thinks he should be taking 1 cap twice a day instead of 2.  He brought in both the amoxicillin and clarithromycin but was only talking about the amoxicillin.    Please advise.  Can call patient at [phone]

## 2018-05-29 IMAGING — DX DG CHEST 2V
2 series · 2 of 2 positions shown · non-contrast
Comparison: Chest x-ray of November 25, 2012

CLINICAL DATA: Two weeks of cough. History of hypertension,
hyperlipidemia, former smoker.

EXAM:
CHEST  2 VIEW

[chest pa]
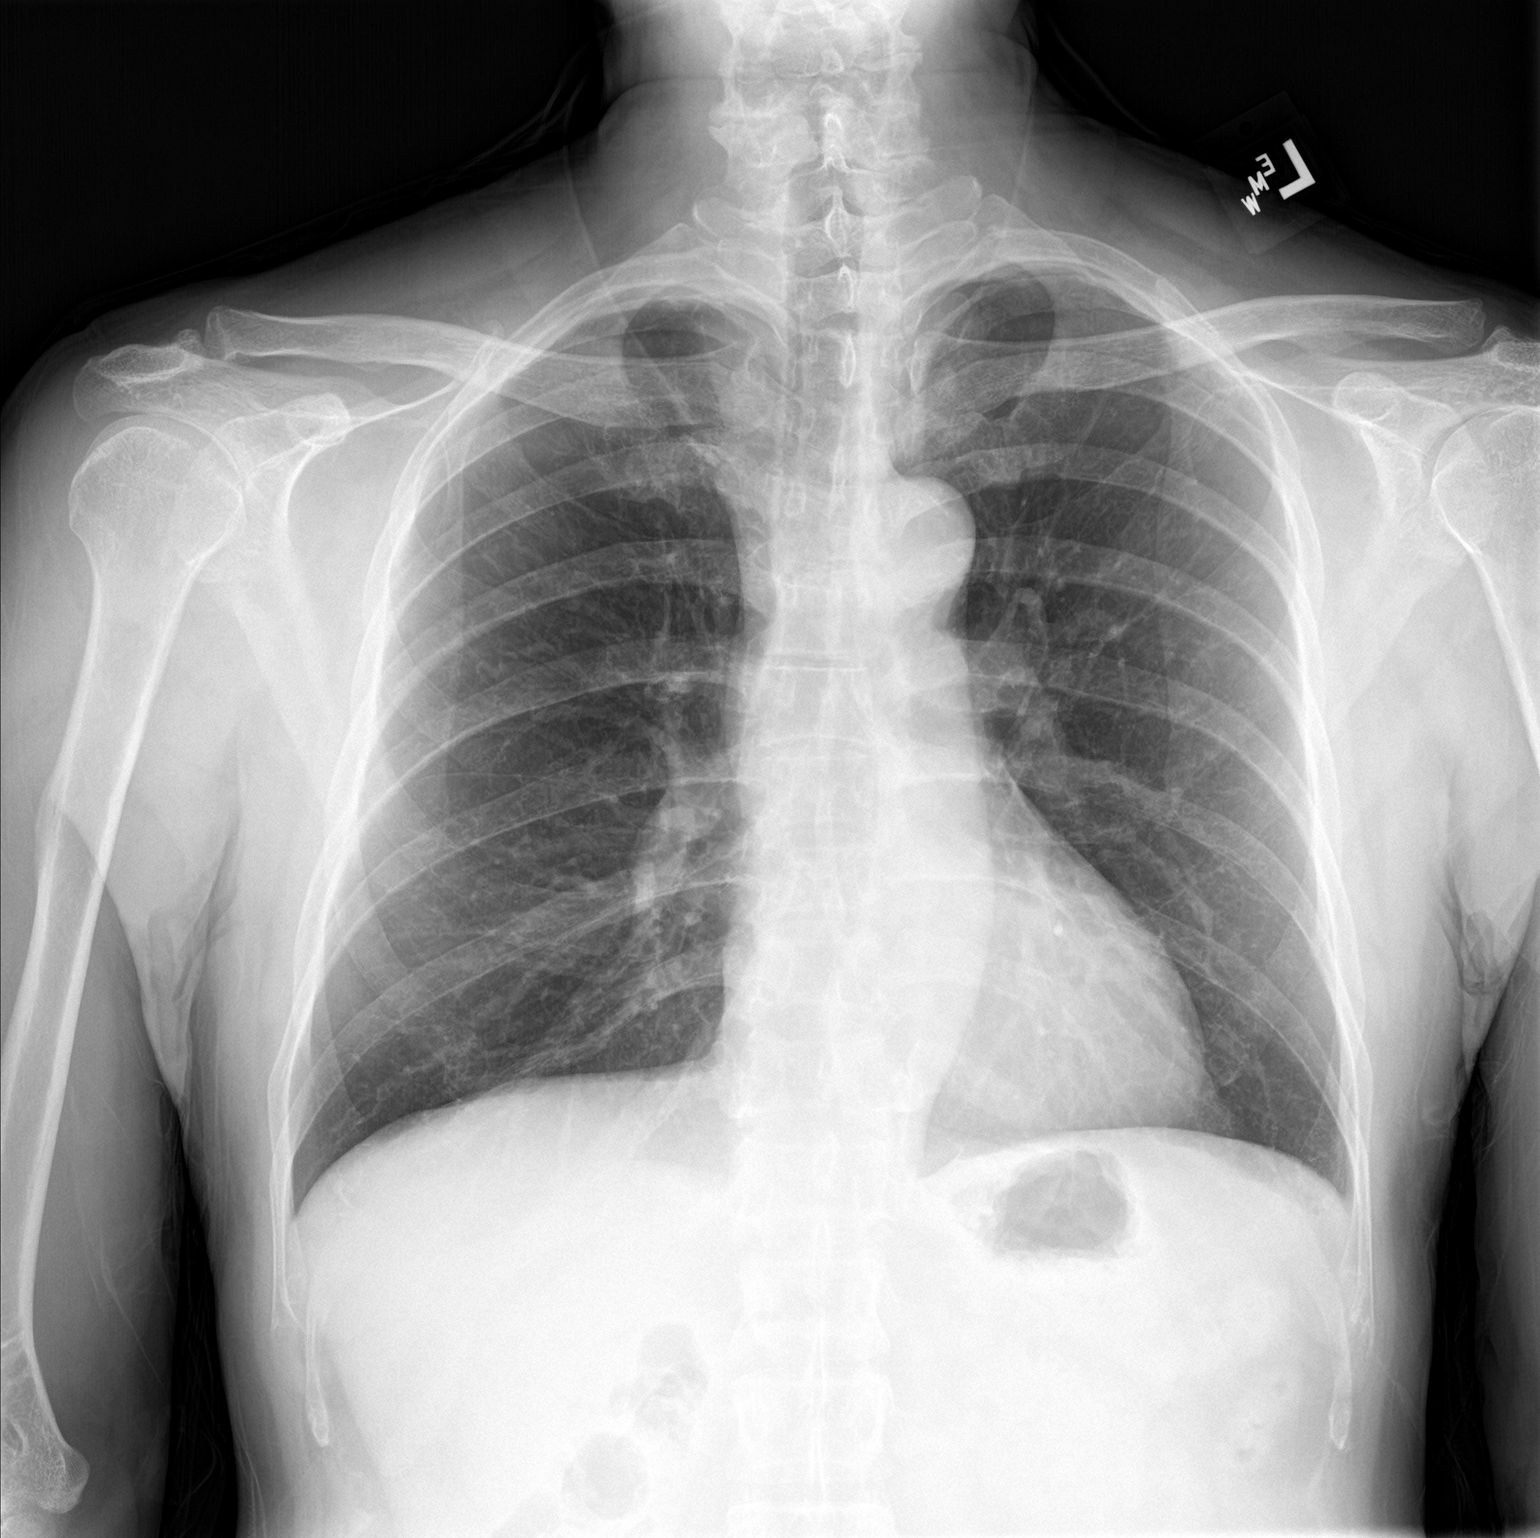

[chest lat]
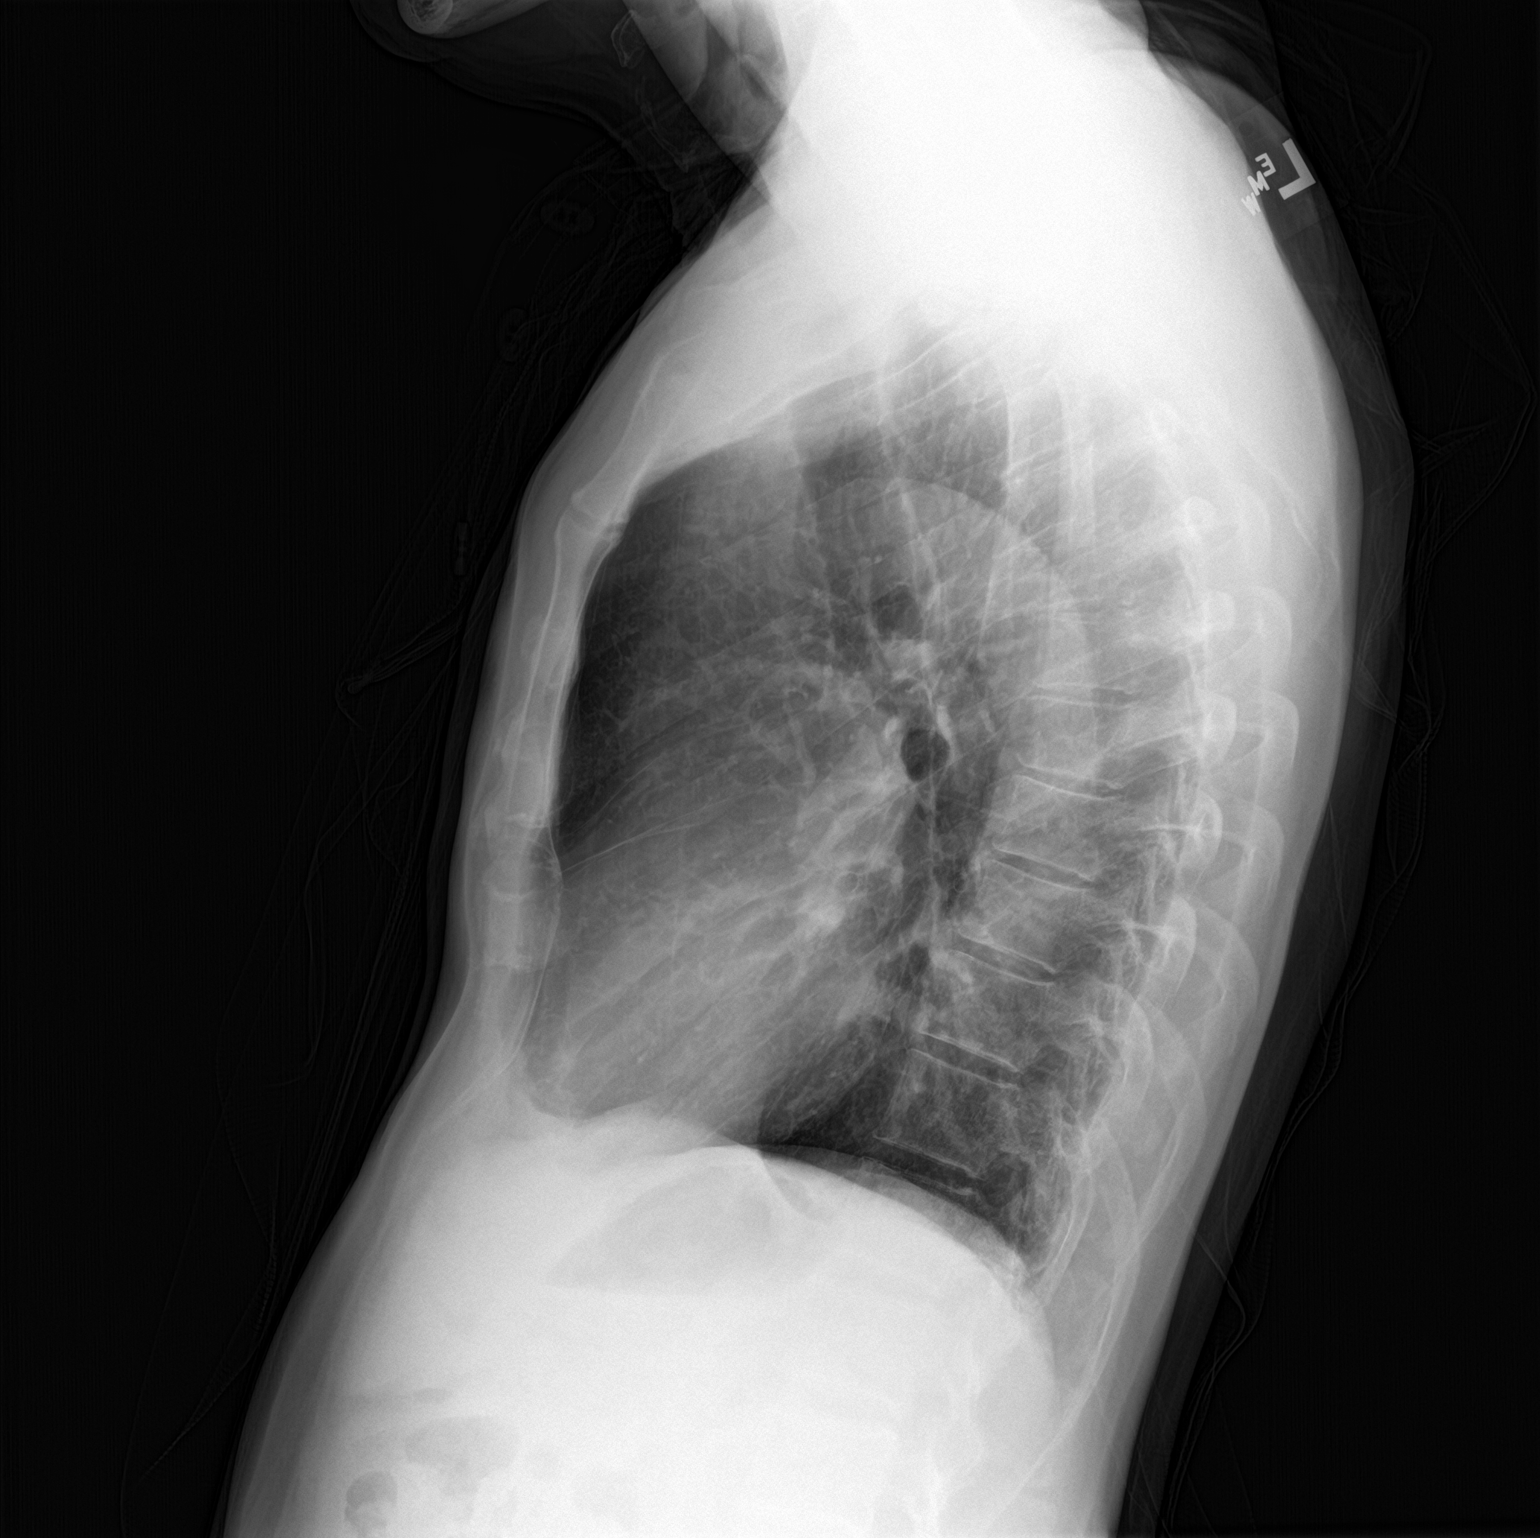

[2 of 2 positions shown; findings below may reference images not displayed]

FINDINGS: The lungs are mildly hyperinflated with hemidiaphragm flattening.
There is no infiltrate, atelectasis, or pleural effusion. The heart
and pulmonary vascularity are normal. The mediastinum is normal in
width. There is calcification in the wall of the aortic arch. The
bony thorax exhibits no acute abnormality. Hyperinflation consistent
with COPD or reactive airway disease. No pneumonia, CHF, nor other
acute cardiopulmonary abnormality.

Thoracic aortic atherosclerosis.
IMPRESSION: No active cardiopulmonary disease.

## 2018-06-20 ENCOUNTER — Other Ambulatory Visit: Payer: Self-pay | Admitting: Family Medicine

## 2018-06-20 DIAGNOSIS — R1013 Epigastric pain: Secondary | ICD-10-CM

## 2018-10-05 ENCOUNTER — Other Ambulatory Visit: Payer: Self-pay | Admitting: Family Medicine

## 2018-10-05 DIAGNOSIS — E785 Hyperlipidemia, unspecified: Secondary | ICD-10-CM

## 2018-10-05 DIAGNOSIS — Z87438 Personal history of other diseases of male genital organs: Secondary | ICD-10-CM

## 2018-10-08 ENCOUNTER — Encounter: Payer: Medicare HMO | Admitting: Family Medicine

## 2018-10-14 ENCOUNTER — Other Ambulatory Visit: Payer: Self-pay | Admitting: Family Medicine

## 2018-10-14 DIAGNOSIS — J301 Allergic rhinitis due to pollen: Secondary | ICD-10-CM

## 2018-10-15 ENCOUNTER — Encounter: Payer: Medicare HMO | Admitting: Family Medicine

## 2018-10-22 ENCOUNTER — Other Ambulatory Visit: Payer: Self-pay | Admitting: Family Medicine

## 2018-10-22 DIAGNOSIS — I10 Essential (primary) hypertension: Secondary | ICD-10-CM

## 2019-01-23 ENCOUNTER — Other Ambulatory Visit: Payer: Self-pay | Admitting: Family Medicine

## 2019-01-23 DIAGNOSIS — I10 Essential (primary) hypertension: Secondary | ICD-10-CM

## 2019-01-24 ENCOUNTER — Other Ambulatory Visit: Payer: Self-pay | Admitting: Family Medicine

## 2019-01-24 DIAGNOSIS — I10 Essential (primary) hypertension: Secondary | ICD-10-CM

## 2019-02-18 ENCOUNTER — Other Ambulatory Visit: Payer: Self-pay

## 2019-02-18 ENCOUNTER — Encounter: Payer: Medicare HMO | Admitting: Family Medicine

## 2019-02-18 ENCOUNTER — Encounter: Payer: Self-pay | Admitting: Family Medicine

## 2019-02-18 ENCOUNTER — Ambulatory Visit (INDEPENDENT_AMBULATORY_CARE_PROVIDER_SITE_OTHER): Payer: Medicare HMO | Admitting: Family Medicine

## 2019-02-18 VITALS — BP 120/71 | HR 60 | Temp 98.6°F | Resp 18 | Ht 60.0 in | Wt 116.4 lb

## 2019-02-18 DIAGNOSIS — I10 Essential (primary) hypertension: Secondary | ICD-10-CM

## 2019-02-18 DIAGNOSIS — R3911 Hesitancy of micturition: Secondary | ICD-10-CM

## 2019-02-18 DIAGNOSIS — J301 Allergic rhinitis due to pollen: Secondary | ICD-10-CM | POA: Diagnosis not present

## 2019-02-18 DIAGNOSIS — R3915 Urgency of urination: Secondary | ICD-10-CM | POA: Diagnosis not present

## 2019-02-18 DIAGNOSIS — N401 Enlarged prostate with lower urinary tract symptoms: Secondary | ICD-10-CM

## 2019-02-18 DIAGNOSIS — E785 Hyperlipidemia, unspecified: Secondary | ICD-10-CM

## 2019-02-18 LAB — COMPREHENSIVE METABOLIC PANEL
ALT: 19 U/L (ref 0–53)
AST: 19 U/L (ref 0–37)
Albumin: 4.3 g/dL (ref 3.5–5.2)
Alkaline Phosphatase: 57 U/L (ref 39–117)
BUN: 25 mg/dL — ABNORMAL HIGH (ref 6–23)
CO2: 28 mEq/L (ref 19–32)
Calcium: 9.3 mg/dL (ref 8.4–10.5)
Chloride: 104 mEq/L (ref 96–112)
Creatinine, Ser: 1 mg/dL (ref 0.40–1.50)
GFR: 74.35 mL/min (ref >60.00)
Glucose, Bld: 88 mg/dL (ref 70–99)
Potassium: 4.7 mEq/L (ref 3.5–5.1)
Sodium: 138 mEq/L (ref 135–145)
Total Bilirubin: 1.1 mg/dL (ref 0.2–1.2)
Total Protein: 6.7 g/dL (ref 6.0–8.3)

## 2019-02-18 LAB — LIPID PANEL
Cholesterol: 132 mg/dL (ref 0–200)
HDL: 46.4 mg/dL (ref >39.00)
LDL Cholesterol: 76 mg/dL (ref 0–99)
NonHDL: 85.22
Total CHOL/HDL Ratio: 3
Triglycerides: 45 mg/dL (ref 0.0–149.0)
VLDL: 9 mg/dL (ref 0.0–40.0)

## 2019-02-18 LAB — HEMOCCULT GUIAC POC 1CARD (OFFICE): Fecal Occult Blood, POC: NEGATIVE

## 2019-02-18 LAB — PSA: PSA: 0.42 ng/mL (ref 0.10–4.00)

## 2019-02-18 MED ORDER — LOSARTAN POTASSIUM 25 MG PO TABS: 50.0000 mg | ORAL_TABLET | Freq: Every day | ORAL | 1 refills | Status: DC

## 2019-02-18 MED ORDER — ATORVASTATIN CALCIUM 20 MG PO TABS: ORAL_TABLET | ORAL | 1 refills | Status: DC

## 2019-02-18 MED ORDER — HYDROCHLOROTHIAZIDE 25 MG PO TABS: 25.0000 mg | ORAL_TABLET | Freq: Every day | ORAL | 1 refills | Status: DC

## 2019-02-18 MED ORDER — LEVOCETIRIZINE DIHYDROCHLORIDE 5 MG PO TABS: ORAL_TABLET | ORAL | 3 refills | Status: DC

## 2019-02-18 NOTE — Assessment & Plan Note (Signed)
Tolerating statin, encouraged heart healthy diet, avoid trans fats, minimize simple carbs and saturated fats. Increase exercise as tolerated 

## 2019-02-18 NOTE — Patient Instructions (Signed)

## 2019-02-18 NOTE — Progress Notes (Signed)
Patient ID: Lance Oneal, male    DOB: [birth year]  Age: 68 y.o. MRN: [id number]    Subjective:  Subjective  HPI Lance Oneal presents for f/u bp and cholesterol.  He is also asking for a prostate exam.  He is complaining about trouble with urination and weak erections.  He has not seen urology in 2 years.  No other complaints.    Review of Systems  Constitutional: Negative.   HENT: Negative for congestion, ear pain, hearing loss, nosebleeds, postnasal drip, rhinorrhea, sinus pressure, sneezing and tinnitus.   Eyes: Negative for photophobia, discharge, itching and visual disturbance.  Respiratory: Negative.   Cardiovascular: Negative.   Gastrointestinal: Negative for abdominal distention, abdominal pain, anal bleeding, blood in stool and constipation.  Endocrine: Negative.   Genitourinary: Positive for decreased urine volume. Negative for discharge, dysuria, frequency, penile pain, penile swelling, scrotal swelling, testicular pain and urgency.  Musculoskeletal: Negative.   Skin: Negative.   Allergic/Immunologic: Negative.   Neurological: Negative for dizziness, weakness, light-headedness, numbness and headaches.  Psychiatric/Behavioral: Negative for agitation, confusion, decreased concentration, dysphoric mood, sleep disturbance and suicidal ideas. The patient is not nervous/anxious.     History Past Medical History:  Diagnosis Date   Hyperlipidemia    Hypertension     He has a past surgical history that includes Hernia repair.   His family history includes Hypertension in his mother; Liver cancer in his father and unknown relative.He reports that he quit smoking about 13 years ago. His smoking use included cigarettes. He smoked 0.50 packs per day. He has never used smokeless tobacco. He reports current alcohol use. He reports that he does not use drugs.  Current Outpatient Medications on File Prior to Visit  Medication Sig Dispense Refill   finasteride (PROSCAR) 5 MG tablet TAKE 1 TABLET  BY MOUTH EVERY DAY 90 tablet 1   fluticasone (FLONASE) 50 MCG/ACT nasal spray Place 2 sprays into both nostrils daily. 16 g 6   pantoprazole (PROTONIX) 40 MG tablet TAKE 1 TABLET BY MOUTH EVERY DAY 90 tablet 1   clarithromycin (BIAXIN) 500 MG tablet Take 1 tablet (500 mg total) by mouth 2 (two) times daily. (Patient not taking: Reported on 02/18/2019) 28 tablet 0   metroNIDAZOLE (FLAGYL) 500 MG tablet Take 1 tablet (500 mg total) by mouth 3 (three) times daily. This replaces amoxicillin (Patient not taking: Reported on 02/18/2019) 42 tablet 0   No current facility-administered medications on file prior to visit.      Objective:  Objective  Physical Exam Vitals signs and nursing note reviewed.  Constitutional:      General: He is sleeping.     Appearance: He is well-developed.  HENT:     Head: Normocephalic and atraumatic.  Eyes:     Pupils: Pupils are equal, round, and reactive to light.  Neck:     Musculoskeletal: Normal range of motion and neck supple.     Thyroid: No thyromegaly.  Cardiovascular:     Rate and Rhythm: Normal rate and regular rhythm.     Heart sounds: No murmur.  Pulmonary:     Effort: Pulmonary effort is normal. No respiratory distress.     Breath sounds: Normal breath sounds. No wheezing or rales.  Chest:     Chest wall: No tenderness.  Genitourinary:    Prostate: Enlarged. Not tender and no nodules present.     Rectum: Normal. Guaiac result negative.  Musculoskeletal:        General: No tenderness.  Skin:  General: Skin is warm and dry.  Neurological:     Mental Status: He is oriented to person, place, and time.  Psychiatric:        Behavior: Behavior normal.        Thought Content: Thought content normal.        Judgment: Judgment normal.    BP 120/71 (BP Location: Left Arm, Patient Position: Sitting, Cuff Size: Normal)    Pulse 60    Temp 98.6 F (37 C) (Oral)    Resp 18    Ht 5' (1.524 m)    Wt 116 lb 6.4 oz (52.8 kg)    SpO2 100%    BMI  22.73 kg/m  Wt Readings from Last 3 Encounters:  02/18/19 116 lb 6.4 oz (52.8 kg)  04/09/18 117 lb (53.1 kg)  09/04/17 122 lb 6.4 oz (55.5 kg)     Lab Results  Component Value Date   WBC 9.0 09/06/2016   HGB 14.0 09/06/2016   HCT 41.7 09/06/2016   PLT 331.0 09/06/2016   GLUCOSE 79 04/09/2018   CHOL 139 04/09/2018   TRIG 94.0 04/09/2018   HDL 43.50 04/09/2018   LDLDIRECT 101.0 09/06/2016   LDLCALC 77 04/09/2018   ALT 20 04/09/2018   AST 23 04/09/2018   NA 140 04/09/2018   K 4.2 04/09/2018   CL 103 04/09/2018   CREATININE 0.90 04/09/2018   BUN 22 04/09/2018   CO2 30 04/09/2018   TSH 0.59 09/06/2016   PSA 0.74 02/27/2017    Dg Chest 2 View  Result Date: 09/06/2016 CLINICAL DATA:  Two weeks of cough. History of hypertension, hyperlipidemia, former smoker. EXAM: CHEST  2 VIEW COMPARISON:  Chest x-ray of Nov 25, 2012 FINDINGS: The lungs are mildly hyperinflated with hemidiaphragm flattening. There is no infiltrate, atelectasis, or pleural effusion. The heart and pulmonary vascularity are normal. The mediastinum is normal in width. There is calcification in the wall of the aortic arch. The bony thorax exhibits no acute abnormality. Hyperinflation consistent with COPD or reactive airway disease. No pneumonia, CHF, nor other acute cardiopulmonary abnormality. Thoracic aortic atherosclerosis. IMPRESSION: No active cardiopulmonary disease. Electronically Signed   By: David  Martinique M.D.   On: 09/06/2016 15:00     Assessment & Plan:  Plan  I have changed Lance Oneal's hydrochlorothiazide and losartan. I am also having him maintain his fluticasone, clarithromycin, metroNIDAZOLE, pantoprazole, finasteride, levocetirizine, and atorvastatin.  Meds ordered this encounter  Medications   hydrochlorothiazide (HYDRODIURIL) 25 MG tablet    Sig: Take 1 tablet (25 mg total) by mouth daily.    Dispense:  90 tablet    Refill:  1    DX Code Needed  PATIENT REQUESTING A NEW RX.Marland Kitchen   losartan (COZAAR)  25 MG tablet    Sig: Take 2 tablets (50 mg total) by mouth daily.    Dispense:  180 tablet    Refill:  1    DX Code Needed  .   levocetirizine (XYZAL) 5 MG tablet    Sig: TAKE 1 TABLET BY MOUTH EVERY DAY IN THE EVENING    Dispense:  90 tablet    Refill:  3   atorvastatin (LIPITOR) 20 MG tablet    Sig: TAKE 1 TABLET BY MOUTH EVERYDAY AT BEDTIME    Dispense:  90 tablet    Refill:  1    DX Code Needed  PATIENT REQUESTING NEW RX.    Problem List Items Addressed This Visit      Unprioritized  Benign prostatic hyperplasia (BPH) with urinary urgency    Refer to urology Check psa  Symptoms are worsening       Essential hypertension    Well controlled, no changes to meds. Encouraged heart healthy diet such as the DASH diet and exercise as tolerated.       Relevant Medications   hydrochlorothiazide (HYDRODIURIL) 25 MG tablet   losartan (COZAAR) 25 MG tablet   atorvastatin (LIPITOR) 20 MG tablet   Other Relevant Orders   Lipid panel   Comprehensive metabolic panel   Hyperlipidemia    Tolerating statin, encouraged heart healthy diet, avoid trans fats, minimize simple carbs and saturated fats. Increase exercise as tolerated      Relevant Medications   hydrochlorothiazide (HYDRODIURIL) 25 MG tablet   losartan (COZAAR) 25 MG tablet   atorvastatin (LIPITOR) 20 MG tablet   Other Relevant Orders   Lipid panel   Comprehensive metabolic panel   Seasonal allergic rhinitis due to pollen   Relevant Medications   levocetirizine (XYZAL) 5 MG tablet    Other Visit Diagnoses    Benign prostatic hyperplasia with urinary hesitancy    -  Primary   Relevant Orders   PSA   Ambulatory referral to Urology   POCT Occult Blood Stool (Completed)      Follow-up: Return in about 6 months (around 08/21/2019) for hypertension, hyperlipidemia, annual exam, fasting.  Donato SchultzYvonne R Lowne Chase, DO    -

## 2019-02-18 NOTE — Assessment & Plan Note (Signed)
Refer to urology Check psa  Symptoms are worsening

## 2019-02-18 NOTE — Assessment & Plan Note (Signed)
Well controlled, no changes to meds. Encouraged heart healthy diet such as the DASH diet and exercise as tolerated.  °

## 2019-02-25 DIAGNOSIS — R69 Illness, unspecified: Secondary | ICD-10-CM | POA: Diagnosis not present

## 2019-04-14 ENCOUNTER — Other Ambulatory Visit: Payer: Self-pay | Admitting: Family Medicine

## 2019-04-14 DIAGNOSIS — Z87438 Personal history of other diseases of male genital organs: Secondary | ICD-10-CM

## 2019-05-21 ENCOUNTER — Other Ambulatory Visit: Payer: Self-pay

## 2019-05-21 ENCOUNTER — Encounter: Payer: Medicare HMO | Admitting: Family Medicine

## 2019-06-24 NOTE — Progress Notes (Signed)
This encounter was created in error - please disregard.

## 2019-08-21 ENCOUNTER — Other Ambulatory Visit: Payer: Self-pay | Admitting: Family Medicine

## 2019-08-21 DIAGNOSIS — I10 Essential (primary) hypertension: Secondary | ICD-10-CM

## 2019-09-15 ENCOUNTER — Other Ambulatory Visit: Payer: Self-pay | Admitting: Family Medicine

## 2019-09-15 DIAGNOSIS — E785 Hyperlipidemia, unspecified: Secondary | ICD-10-CM

## 2019-09-16 ENCOUNTER — Other Ambulatory Visit: Payer: Self-pay | Admitting: Family Medicine

## 2019-09-16 DIAGNOSIS — I10 Essential (primary) hypertension: Secondary | ICD-10-CM

## 2019-09-16 NOTE — Telephone Encounter (Signed)
Refilled atorvastatin prescription. Pt last seen 02/18/19 and advised 6 month follow up. Pt will need follow up before further refills can be given. Letter mailed to pt.

## 2019-09-29 ENCOUNTER — Other Ambulatory Visit: Payer: Self-pay | Admitting: Family Medicine

## 2019-09-29 DIAGNOSIS — I10 Essential (primary) hypertension: Secondary | ICD-10-CM

## 2019-10-13 ENCOUNTER — Other Ambulatory Visit: Payer: Self-pay | Admitting: Family Medicine

## 2019-10-13 DIAGNOSIS — Z87438 Personal history of other diseases of male genital organs: Secondary | ICD-10-CM

## 2019-10-14 ENCOUNTER — Other Ambulatory Visit: Payer: Self-pay | Admitting: Family Medicine

## 2019-10-14 DIAGNOSIS — I10 Essential (primary) hypertension: Secondary | ICD-10-CM

## 2019-11-05 ENCOUNTER — Other Ambulatory Visit: Payer: Self-pay | Admitting: Family Medicine

## 2019-11-05 DIAGNOSIS — I10 Essential (primary) hypertension: Secondary | ICD-10-CM

## 2019-11-15 ENCOUNTER — Other Ambulatory Visit: Payer: Self-pay | Admitting: Family Medicine

## 2019-11-15 DIAGNOSIS — I10 Essential (primary) hypertension: Secondary | ICD-10-CM

## 2019-12-12 ENCOUNTER — Other Ambulatory Visit: Payer: Self-pay | Admitting: Family Medicine

## 2019-12-12 DIAGNOSIS — I10 Essential (primary) hypertension: Secondary | ICD-10-CM

## 2020-02-05 ENCOUNTER — Other Ambulatory Visit: Payer: Self-pay | Admitting: Family Medicine

## 2020-02-05 DIAGNOSIS — I10 Essential (primary) hypertension: Secondary | ICD-10-CM

## 2020-08-21 ENCOUNTER — Other Ambulatory Visit: Payer: Self-pay

## 2020-08-24 ENCOUNTER — Ambulatory Visit (INDEPENDENT_AMBULATORY_CARE_PROVIDER_SITE_OTHER): Payer: Medicare HMO | Admitting: Medical

## 2020-08-24 ENCOUNTER — Telehealth: Payer: Self-pay

## 2020-08-24 ENCOUNTER — Encounter: Payer: Self-pay | Admitting: Medical

## 2020-08-24 ENCOUNTER — Other Ambulatory Visit: Payer: Self-pay

## 2020-08-24 VITALS — BP 113/74 | HR 72 | Temp 97.8°F | Resp 20 | Ht 60.0 in | Wt 123.6 lb

## 2020-08-24 DIAGNOSIS — J301 Allergic rhinitis due to pollen: Secondary | ICD-10-CM | POA: Diagnosis not present

## 2020-08-24 DIAGNOSIS — R1013 Epigastric pain: Secondary | ICD-10-CM | POA: Diagnosis not present

## 2020-08-24 DIAGNOSIS — R351 Nocturia: Secondary | ICD-10-CM | POA: Diagnosis not present

## 2020-08-24 DIAGNOSIS — N401 Enlarged prostate with lower urinary tract symptoms: Secondary | ICD-10-CM

## 2020-08-24 DIAGNOSIS — Z87438 Personal history of other diseases of male genital organs: Secondary | ICD-10-CM | POA: Diagnosis not present

## 2020-08-24 DIAGNOSIS — I10 Essential (primary) hypertension: Secondary | ICD-10-CM

## 2020-08-24 DIAGNOSIS — E785 Hyperlipidemia, unspecified: Secondary | ICD-10-CM | POA: Diagnosis not present

## 2020-08-24 DIAGNOSIS — R3915 Urgency of urination: Secondary | ICD-10-CM | POA: Diagnosis not present

## 2020-08-24 LAB — LIPID PANEL
Cholesterol: 206 mg/dL — ABNORMAL HIGH (ref 0–200)
HDL: 51.8 mg/dL (ref >39.00)
LDL Cholesterol: 134 mg/dL — ABNORMAL HIGH (ref 0–99)
NonHDL: 154.6
Total CHOL/HDL Ratio: 4
Triglycerides: 104 mg/dL (ref 0.0–149.0)
VLDL: 20.8 mg/dL (ref 0.0–40.0)

## 2020-08-24 LAB — COMPREHENSIVE METABOLIC PANEL
ALT: 34 U/L (ref 0–53)
AST: 25 U/L (ref 0–37)
Albumin: 4.6 g/dL (ref 3.5–5.2)
Alkaline Phosphatase: 48 U/L (ref 39–117)
BUN: 26 mg/dL — ABNORMAL HIGH (ref 6–23)
CO2: 31 mEq/L (ref 19–32)
Calcium: 10 mg/dL (ref 8.4–10.5)
Chloride: 99 mEq/L (ref 96–112)
Creatinine, Ser: 1.08 mg/dL (ref 0.40–1.50)
GFR: 70.12 mL/min (ref >60.00)
Glucose, Bld: 86 mg/dL (ref 70–99)
Potassium: 4.1 mEq/L (ref 3.5–5.1)
Sodium: 137 mEq/L (ref 135–145)
Total Bilirubin: 1.3 mg/dL — ABNORMAL HIGH (ref 0.2–1.2)
Total Protein: 7.7 g/dL (ref 6.0–8.3)

## 2020-08-24 LAB — POC URINALSYSI DIPSTICK (AUTOMATED)
Bilirubin, UA: NEGATIVE
Blood, UA: NEGATIVE
Glucose, UA: NEGATIVE
Ketones, UA: NEGATIVE
Leukocytes, UA: NEGATIVE
Nitrite, UA: NEGATIVE
Protein, UA: NEGATIVE
Spec Grav, UA: 1.015 (ref 1.010–1.025)
Urobilinogen, UA: 0.2 E.U./dL
pH, UA: 7 (ref 5.0–8.0)

## 2020-08-24 MED ORDER — PANTOPRAZOLE SODIUM 40 MG PO TBEC: 40.0000 mg | DELAYED_RELEASE_TABLET | Freq: Every day | ORAL | 1 refills | Status: DC

## 2020-08-24 MED ORDER — FINASTERIDE 5 MG PO TABS: 5.0000 mg | ORAL_TABLET | Freq: Every day | ORAL | 1 refills | Status: DC

## 2020-08-24 MED ORDER — HYDROCHLOROTHIAZIDE 25 MG PO TABS: 25.0000 mg | ORAL_TABLET | Freq: Every day | ORAL | 1 refills | Status: DC

## 2020-08-24 MED ORDER — LOSARTAN POTASSIUM 25 MG PO TABS: 50.0000 mg | ORAL_TABLET | Freq: Every day | ORAL | 0 refills | Status: DC

## 2020-08-24 MED ORDER — ATORVASTATIN CALCIUM 20 MG PO TABS: ORAL_TABLET | ORAL | 0 refills | Status: DC

## 2020-08-24 MED ORDER — LEVOCETIRIZINE DIHYDROCHLORIDE 5 MG PO TABS: ORAL_TABLET | ORAL | 3 refills | Status: DC

## 2020-08-24 NOTE — Patient Instructions (Addendum)
Your bp is well controlled today. Recommend take losartan 25 mg daily and hctz 25 mg daily. I think should take both in morning.   For bph and frequent urination. Continue finasteride 5 mg daily. Get UA and psa today. Referred to urologist at pt request.  For high cholesterol will get cmp and lipid panel today fasting.  Follow up date three month or as needed.  Sent message to front office supervisor to investigate you getting back in with Dr. Reynolds Bowl as your pcp.

## 2020-08-24 NOTE — Progress Notes (Signed)
Subjective:    Patient ID: Lance Oneal, male    DOB: 22-Aug-1950, 70 y.o.   MRN: [id number]  HPI   Pt in for regular check up. He states prior pt of Dr Katina Degree shows pt back in May 2020). Left for a year due to insurance reasons. Now back and states was expecting to see Dr. Laury Axon. Was scheduled with me.  Pt did not eat breakast today.  Hx of htn, high cholesterol and bph.  He has no cardiac or neurologic signs or symptoms.  Pt does have some frequent urination at night. Has seen urologist in the past and wants to see them again. He want to see [facility]. Dr. Urban Gibson in the past. Most of time has bee urinating about 4 times a night. About 7-8 months now. Pt on finasteride 5 mg a day.  Htn.  On losartan 25 mg daily and hctz 25 mg daily.(he states takes hctz)  High cholesterol history. Pt is on atorvastatin.   Pt has had 3 covid vaccine series.    Review of Systems  Constitutional: Negative for chills, fatigue and fever.  Respiratory: Negative for cough, chest tightness, shortness of breath and wheezing.   Cardiovascular: Negative for chest pain and palpitations.  Gastrointestinal: Negative for abdominal pain.  Genitourinary: Positive for frequency. Negative for difficulty urinating, enuresis, genital sores, penile pain and testicular pain.  Musculoskeletal: Negative for back pain, myalgias and neck pain.  Skin: Negative for rash.  Neurological: Negative for dizziness, speech difficulty, weakness, numbness and headaches.  Hematological: Negative for adenopathy. Does not bruise/bleed easily.  Psychiatric/Behavioral: Negative for behavioral problems, sleep disturbance and suicidal ideas. The patient is not nervous/anxious.    Past Medical History:  Diagnosis Date  . Hyperlipidemia   . Hypertension      Social History   Socioeconomic History  . Marital status: Married    Spouse name: Not on file  . Number of children: Not on file  . Years of education: Not on file  .  Highest education level: Not on file  Occupational History  . Not on file  Tobacco Use  . Smoking status: Former Smoker    Packs/day: 0.50    Types: Cigarettes    Quit date: 02/26/2005    Years since quitting: 15.5  . Smokeless tobacco: Never Used  Substance and Sexual Activity  . Alcohol use: Yes  . Drug use: No  . Sexual activity: Yes    Partners: Female  Other Topics Concern  . Not on file  Social History Narrative  . Not on file   Social Determinants of Health   Financial Resource Strain: Not on file  Food Insecurity: Not on file  Transportation Needs: Not on file  Physical Activity: Not on file  Stress: Not on file  Social Connections: Not on file  Intimate Partner Violence: Not on file    Past Surgical History:  Procedure Laterality Date  . HERNIA REPAIR      Family History  Problem Relation Age of Onset  . Hypertension Mother   . Colon cancer Neg Hx   . Pancreatic cancer Neg Hx   . Stomach cancer Neg Hx   . Liver cancer Other   . Liver cancer Father     Allergies  Allergen Reactions  . Lisinopril     Cough     Current Outpatient Medications on File Prior to Visit  Medication Sig Dispense Refill  . atorvastatin (LIPITOR) 20 MG tablet TAKE 1 TABLET BY MOUTH  EVERYDAY AT BEDTIME 90 tablet 0  . finasteride (PROSCAR) 5 MG tablet TAKE 1 TABLET BY MOUTH EVERY DAY 90 tablet 1  . fluticasone (FLONASE) 50 MCG/ACT nasal spray Place 2 sprays into both nostrils daily. 16 g 6  . hydrochlorothiazide (HYDRODIURIL) 25 MG tablet Take 1 tablet (25 mg total) by mouth daily. NEEDS OV/FOLLOW UP BEFORE ANY MORE REFILLS 90 tablet 0  . levocetirizine (XYZAL) 5 MG tablet TAKE 1 TABLET BY MOUTH EVERY DAY IN THE EVENING 90 tablet 3  . losartan (COZAAR) 25 MG tablet TAKE 2 TABLETS BY MOUTH EVERY DAY 60 tablet 0  . pantoprazole (PROTONIX) 40 MG tablet TAKE 1 TABLET BY MOUTH EVERY DAY 90 tablet 1  . clarithromycin (BIAXIN) 500 MG tablet Take 1 tablet (500 mg total) by mouth 2  (two) times daily. (Patient not taking: No sig reported) 28 tablet 0  . metroNIDAZOLE (FLAGYL) 500 MG tablet Take 1 tablet (500 mg total) by mouth 3 (three) times daily. This replaces amoxicillin (Patient not taking: No sig reported) 42 tablet 0   No current facility-administered medications on file prior to visit.    BP 113/74   Pulse 72   Temp 97.8 F (36.6 C) (Oral)   Resp 20   Ht 5' (1.524 m)   Wt 123 lb 9.6 oz (56.1 kg)   SpO2 99%   BMI 24.14 kg/m       Objective:   Physical Exam  General Mental Status- Alert. General Appearance- Not in acute distress.    Chest and Lung Exam Auscultation: Breath Sounds:-Normal.  Cardiovascular Auscultation:Rythm- Regular. Murmurs & Other Heart Sounds:Auscultation of the heart reveals- No Murmurs.  Abdomen Inspection:-Inspeection Normal. Palpation/Percussion:Note:No mass. Palpation and Percussion of the abdomen reveal- Non Tender, Non Distended + BS, no rebound or guarding.   Neurologic Cranial Nerve exam:- CN III-XII intact(No nystagmus), symmetric smile. Strength:- 5/5 equal and symmetric strength both upper and lower extremities.      Assessment & Plan:  Your bp is well controlled today. Recommend take losartan 25 mg daily and hctz 25 mg daily. I think should take both in morning.   For bph and frequent urination. Continue finasteride 5 mg daily. Get UA and psa today. Referred to urologist at pt request.  For high cholesterol will get cmp and lipid panel today fasting.  Follow up date three month or as needed.  Sent message to front office supervisor to investigate you getting back in with Dr. Reynolds Bowl as your pcp.  Esperanza Richters, PA-C

## 2020-08-24 NOTE — Telephone Encounter (Signed)
yes

## 2020-08-24 NOTE — Telephone Encounter (Signed)
Patient had made an appointment to re-establish care with the practice.  Per Esperanza Richters, PA, patient was wanting to see you today, and not him when he re-established care.  When patient left the practice for the 2020 calendar year, it was only due to insurance reasons.  Would you be willing to accept patient?  Please advise.

## 2020-11-16 ENCOUNTER — Encounter: Payer: Medicare HMO | Admitting: Family Medicine

## 2020-11-30 ENCOUNTER — Encounter: Payer: Self-pay | Admitting: Family Medicine

## 2020-11-30 ENCOUNTER — Ambulatory Visit (INDEPENDENT_AMBULATORY_CARE_PROVIDER_SITE_OTHER): Payer: Medicare HMO | Admitting: Family Medicine

## 2020-11-30 ENCOUNTER — Other Ambulatory Visit: Payer: Self-pay

## 2020-11-30 VITALS — BP 122/80 | HR 87 | Temp 97.8°F | Resp 18 | Ht 60.0 in | Wt 123.2 lb

## 2020-11-30 DIAGNOSIS — I10 Essential (primary) hypertension: Secondary | ICD-10-CM | POA: Diagnosis not present

## 2020-11-30 DIAGNOSIS — J301 Allergic rhinitis due to pollen: Secondary | ICD-10-CM

## 2020-11-30 DIAGNOSIS — R1013 Epigastric pain: Secondary | ICD-10-CM

## 2020-11-30 DIAGNOSIS — E785 Hyperlipidemia, unspecified: Secondary | ICD-10-CM

## 2020-11-30 MED ORDER — LOSARTAN POTASSIUM 25 MG PO TABS: ORAL_TABLET | ORAL | 3 refills | Status: DC

## 2020-11-30 MED ORDER — HYDROCHLOROTHIAZIDE 25 MG PO TABS: 25.0000 mg | ORAL_TABLET | Freq: Every day | ORAL | 1 refills | Status: DC

## 2020-11-30 MED ORDER — FLUTICASONE PROPIONATE 50 MCG/ACT NA SUSP: 2.0000 | Freq: Every day | NASAL | 6 refills | Status: DC

## 2020-11-30 NOTE — Assessment & Plan Note (Signed)
Encouraged heart healthy diet, increase exercise, avoid trans fats, consider a krill oil cap daily 

## 2020-11-30 NOTE — Assessment & Plan Note (Signed)
Well controlled, no changes to meds. Encouraged heart healthy diet such as the DASH diet and exercise as tolerated.  °

## 2020-11-30 NOTE — Progress Notes (Signed)
Patient ID: Lance Oneal, male    DOB: 1951-02-27  Age: 70 y.o. MRN: [id number]    Subjective:  Subjective  HPI Lance Oneal presents for transfer of care. He reports feeling well. He endorses taking 25 mg hydrochlorothiazide PO Daily for his dx of hypertension. He notes that he has been only taking one dose (25 mg) of 25 mg losartan PO Daily. BP Readings from Last 3 Encounters:  11/30/20 122/80  08/24/20 113/74  02/18/19 120/71  His cholesterol is well managed with 20 mg of Lipitor PO Daily.  Lab Results  Component Value Date   CHOL 206 (H) 08/24/2020   CHOL 132 02/18/2019   CHOL 139 04/09/2018   Lab Results  Component Value Date   HDL 51.80 08/24/2020   HDL 46.40 02/18/2019   HDL 43.50 04/09/2018   Lab Results  Component Value Date   LDLCALC 134 (H) 08/24/2020   LDLCALC 76 02/18/2019   LDLCALC 77 04/09/2018   Lab Results  Component Value Date   TRIG 104.0 08/24/2020   TRIG 45.0 02/18/2019   TRIG 94.0 04/09/2018   Lab Results  Component Value Date   CHOLHDL 4 08/24/2020   CHOLHDL 3 02/18/2019   CHOLHDL 3 04/09/2018   Lab Results  Component Value Date   LDLDIRECT 101.0 09/06/2016   LDLDIRECT 199.2 06/07/2013   LDLDIRECT 175.8 02/26/2013  He notes that his allergy is managed with Flonase. He reports taking 5 mg finasteride PO Daily for his dx of BPH. He denies any chest pain, SOB, fever, abdominal pain, cough, chills, sore throat, dysuria, urinary incontinence, back pain, HA, or N/V/D at this time.   Review of Systems  Constitutional: Negative for chills, fatigue and fever.  HENT: Negative for ear pain, rhinorrhea, sinus pressure, sinus pain, sore throat and tinnitus.   Eyes: Negative for pain.  Respiratory: Negative for cough, shortness of breath and wheezing.   Cardiovascular: Negative for chest pain.  Gastrointestinal: Negative for abdominal pain, anal bleeding, constipation, diarrhea, nausea and vomiting.  Genitourinary: Negative for flank pain.  Musculoskeletal:  Negative for back pain and neck pain.  Skin: Negative for rash.  Neurological: Negative for seizures, weakness, light-headedness, numbness and headaches.    History Past Medical History:  Diagnosis Date  . Hyperlipidemia   . Hypertension     He has a past surgical history that includes Hernia repair.   His family history includes Hypertension in his mother; Liver cancer in his father and another family member.He reports that he quit smoking about 15 years ago. His smoking use included cigarettes. He smoked 0.50 packs per day. He has never used smokeless tobacco. He reports current alcohol use. He reports that he does not use drugs.  Current Outpatient Medications on File Prior to Visit  Medication Sig Dispense Refill  . atorvastatin (LIPITOR) 20 MG tablet 1 tab po q day 90 tablet 0  . finasteride (PROSCAR) 5 MG tablet Take 1 tablet (5 mg total) by mouth daily. 90 tablet 1  . pantoprazole (PROTONIX) 40 MG tablet Take 1 tablet (40 mg total) by mouth daily. 90 tablet 1   No current facility-administered medications on file prior to visit.     Objective:  Objective  Physical Exam Vitals and nursing note reviewed.  Constitutional:      General: He is not in acute distress.    Appearance: Normal appearance. He is well-developed. He is not ill-appearing.  HENT:     Head: Normocephalic and atraumatic.     Right Ear:  External ear normal.     Left Ear: External ear normal.     Nose: Nose normal.  Eyes:     General:        Right eye: No discharge.        Left eye: No discharge.     Extraocular Movements: Extraocular movements intact.     Pupils: Pupils are equal, round, and reactive to light.  Cardiovascular:     Rate and Rhythm: Normal rate and regular rhythm.     Pulses: Normal pulses.     Heart sounds: Normal heart sounds. No murmur heard. No friction rub. No gallop.   Pulmonary:     Effort: Pulmonary effort is normal. No respiratory distress.     Breath sounds: Normal  breath sounds. No stridor. No wheezing, rhonchi or rales.  Chest:     Chest wall: No tenderness.  Abdominal:     General: Bowel sounds are normal. There is no distension.     Palpations: Abdomen is soft. There is no mass.     Tenderness: There is no abdominal tenderness. There is no guarding or rebound.     Hernia: No hernia is present.  Musculoskeletal:        General: Normal range of motion.     Cervical back: Normal range of motion and neck supple.     Right lower leg: No edema.     Left lower leg: No edema.  Skin:    General: Skin is warm and dry.  Neurological:     Mental Status: He is alert and oriented to person, place, and time.  Psychiatric:        Behavior: Behavior normal.        Thought Content: Thought content normal.    BP 122/80 (BP Location: Right Arm, Patient Position: Sitting, Cuff Size: Normal)   Pulse 87   Temp 97.8 F (36.6 C) (Oral)   Resp 18   Ht 5' (1.524 m)   Wt 123 lb 3.2 oz (55.9 kg)   SpO2 98%   BMI 24.06 kg/m  Wt Readings from Last 3 Encounters:  11/30/20 123 lb 3.2 oz (55.9 kg)  08/24/20 123 lb 9.6 oz (56.1 kg)  02/18/19 116 lb 6.4 oz (52.8 kg)     Lab Results  Component Value Date   WBC 9.0 09/06/2016   HGB 14.0 09/06/2016   HCT 41.7 09/06/2016   PLT 331.0 09/06/2016   GLUCOSE 86 08/24/2020   CHOL 206 (H) 08/24/2020   TRIG 104.0 08/24/2020   HDL 51.80 08/24/2020   LDLDIRECT 101.0 09/06/2016   LDLCALC 134 (H) 08/24/2020   ALT 34 08/24/2020   AST 25 08/24/2020   NA 137 08/24/2020   K 4.1 08/24/2020   CL 99 08/24/2020   CREATININE 1.08 08/24/2020   BUN 26 (H) 08/24/2020   CO2 31 08/24/2020   TSH 0.59 09/06/2016   PSA 0.42 02/18/2019    DG Chest 2 View  Result Date: 09/06/2016 CLINICAL DATA:  Two weeks of cough. History of hypertension, hyperlipidemia, former smoker. EXAM: CHEST  2 VIEW COMPARISON:  Chest x-ray of Nov 25, 2012 FINDINGS: The lungs are mildly hyperinflated with hemidiaphragm flattening. There is no infiltrate,  atelectasis, or pleural effusion. The heart and pulmonary vascularity are normal. The mediastinum is normal in width. There is calcification in the wall of the aortic arch. The bony thorax exhibits no acute abnormality. Hyperinflation consistent with COPD or reactive airway disease. No pneumonia, CHF, nor other acute cardiopulmonary abnormality.  Thoracic aortic atherosclerosis. IMPRESSION: No active cardiopulmonary disease. Electronically Signed   By: David  [place] M.D.   On: 09/06/2016 15:00     Assessment & Plan:  Plan    Meds ordered this encounter  Medications  . hydrochlorothiazide (HYDRODIURIL) 25 MG tablet    Sig: Take 1 tablet (25 mg total) by mouth daily. NEEDS OV/FOLLOW UP BEFORE ANY MORE REFILLS    Dispense:  90 tablet    Refill:  1  . fluticasone (FLONASE) 50 MCG/ACT nasal spray    Sig: Place 2 sprays into both nostrils daily.    Dispense:  16 g    Refill:  6  . losartan (COZAAR) 25 MG tablet    Sig: 1 po qd    Dispense:  90 tablet    Refill:  3    Problem List Items Addressed This Visit      Unprioritized   Essential hypertension   Relevant Medications   hydrochlorothiazide (HYDRODIURIL) 25 MG tablet   losartan (COZAAR) 25 MG tablet   Other Relevant Orders   Lipid panel   Comprehensive metabolic panel   Hyperlipidemia - Primary   Relevant Medications   hydrochlorothiazide (HYDRODIURIL) 25 MG tablet   losartan (COZAAR) 25 MG tablet   Seasonal allergic rhinitis due to pollen   Relevant Medications   fluticasone (FLONASE) 50 MCG/ACT nasal spray      Follow-up: Return in about 6 months (around 06/02/2021) for fasting, annual exam.   I,Gordon Zheng,acting as a scribe for [employer], DO.,have documented all relevant documentation on the behalf of Donato Schultz, DO,as directed by  Donato Schultz, DO while in the presence of Donato Schultz, DO.  I, Donato Schultz, DO, have reviewed all documentation for this visit. The  documentation on 11/30/20 for the exam, diagnosis, procedures, and orders are all accurate and complete.

## 2020-11-30 NOTE — Assessment & Plan Note (Signed)
-  cont protonix

## 2020-12-01 LAB — COMPREHENSIVE METABOLIC PANEL
ALT: 20 U/L (ref 0–53)
AST: 19 U/L (ref 0–37)
Albumin: 4.5 g/dL (ref 3.5–5.2)
Alkaline Phosphatase: 53 U/L (ref 39–117)
BUN: 27 mg/dL — ABNORMAL HIGH (ref 6–23)
CO2: 29 mEq/L (ref 19–32)
Calcium: 9.9 mg/dL (ref 8.4–10.5)
Chloride: 103 mEq/L (ref 96–112)
Creatinine, Ser: 1.15 mg/dL (ref 0.40–1.50)
GFR: 64.9 mL/min (ref >60.00)
Glucose, Bld: 101 mg/dL — ABNORMAL HIGH (ref 70–99)
Potassium: 4.2 mEq/L (ref 3.5–5.1)
Sodium: 142 mEq/L (ref 135–145)
Total Bilirubin: 1.2 mg/dL (ref 0.2–1.2)
Total Protein: 7.1 g/dL (ref 6.0–8.3)

## 2020-12-01 LAB — LIPID PANEL
Cholesterol: 171 mg/dL (ref 0–200)
HDL: 42.1 mg/dL (ref >39.00)
NonHDL: 128.68
Total CHOL/HDL Ratio: 4
Triglycerides: 254 mg/dL — ABNORMAL HIGH (ref 0.0–149.0)
VLDL: 50.8 mg/dL — ABNORMAL HIGH (ref 0.0–40.0)

## 2020-12-01 LAB — LDL CHOLESTEROL, DIRECT: Direct LDL: 110 mg/dL

## 2020-12-02 ENCOUNTER — Other Ambulatory Visit: Payer: Self-pay | Admitting: Family Medicine

## 2020-12-02 ENCOUNTER — Other Ambulatory Visit: Payer: Self-pay

## 2020-12-02 DIAGNOSIS — E785 Hyperlipidemia, unspecified: Secondary | ICD-10-CM

## 2020-12-02 MED ORDER — ATORVASTATIN CALCIUM 40 MG PO TABS: 40.0000 mg | ORAL_TABLET | Freq: Every day | ORAL | 2 refills | Status: DC

## 2021-01-13 ENCOUNTER — Other Ambulatory Visit: Payer: Self-pay | Admitting: Medical

## 2021-01-13 DIAGNOSIS — I10 Essential (primary) hypertension: Secondary | ICD-10-CM

## 2021-01-13 DIAGNOSIS — E785 Hyperlipidemia, unspecified: Secondary | ICD-10-CM

## 2021-01-18 ENCOUNTER — Other Ambulatory Visit: Payer: Self-pay | Admitting: Family Medicine

## 2021-01-18 DIAGNOSIS — E785 Hyperlipidemia, unspecified: Secondary | ICD-10-CM

## 2021-04-14 ENCOUNTER — Other Ambulatory Visit: Payer: Self-pay | Admitting: Medical

## 2021-04-14 DIAGNOSIS — R1013 Epigastric pain: Secondary | ICD-10-CM

## 2021-05-01 ENCOUNTER — Other Ambulatory Visit: Payer: Self-pay | Admitting: Family Medicine

## 2021-05-01 DIAGNOSIS — I10 Essential (primary) hypertension: Secondary | ICD-10-CM

## 2021-06-07 ENCOUNTER — Ambulatory Visit: Payer: Medicare HMO | Attending: Internal Medicine

## 2021-06-07 ENCOUNTER — Encounter: Payer: Self-pay | Admitting: Family Medicine

## 2021-06-07 ENCOUNTER — Other Ambulatory Visit: Payer: Self-pay

## 2021-06-07 ENCOUNTER — Ambulatory Visit (INDEPENDENT_AMBULATORY_CARE_PROVIDER_SITE_OTHER): Payer: Medicare HMO | Admitting: Family Medicine

## 2021-06-07 VITALS — BP 100/80 | HR 77 | Temp 97.7°F | Resp 18 | Ht 60.0 in | Wt 122.2 lb

## 2021-06-07 DIAGNOSIS — E785 Hyperlipidemia, unspecified: Secondary | ICD-10-CM

## 2021-06-07 DIAGNOSIS — Z23 Encounter for immunization: Secondary | ICD-10-CM

## 2021-06-07 DIAGNOSIS — R350 Frequency of micturition: Secondary | ICD-10-CM | POA: Diagnosis not present

## 2021-06-07 DIAGNOSIS — K219 Gastro-esophageal reflux disease without esophagitis: Secondary | ICD-10-CM

## 2021-06-07 DIAGNOSIS — Z87438 Personal history of other diseases of male genital organs: Secondary | ICD-10-CM | POA: Diagnosis not present

## 2021-06-07 DIAGNOSIS — Z1211 Encounter for screening for malignant neoplasm of colon: Secondary | ICD-10-CM

## 2021-06-07 DIAGNOSIS — R1013 Epigastric pain: Secondary | ICD-10-CM

## 2021-06-07 DIAGNOSIS — I10 Essential (primary) hypertension: Secondary | ICD-10-CM | POA: Diagnosis not present

## 2021-06-07 DIAGNOSIS — Z0000 Encounter for general adult medical examination without abnormal findings: Secondary | ICD-10-CM

## 2021-06-07 DIAGNOSIS — R3915 Urgency of urination: Secondary | ICD-10-CM

## 2021-06-07 DIAGNOSIS — N401 Enlarged prostate with lower urinary tract symptoms: Secondary | ICD-10-CM

## 2021-06-07 LAB — CBC WITH DIFFERENTIAL/PLATELET
Basophils Absolute: 0 10*3/uL (ref 0.0–0.1)
Basophils Relative: 0.3 % (ref 0.0–3.0)
Eosinophils Absolute: 0.1 10*3/uL (ref 0.0–0.7)
Eosinophils Relative: 1.5 % (ref 0.0–5.0)
HCT: 42.2 % (ref 39.0–52.0)
Hemoglobin: 14.3 g/dL (ref 13.0–17.0)
Lymphocytes Relative: 22.3 % (ref 12.0–46.0)
Lymphs Abs: 1.5 10*3/uL (ref 0.7–4.0)
MCHC: 33.9 g/dL (ref 30.0–36.0)
MCV: 89.4 fl (ref 78.0–100.0)
Monocytes Absolute: 0.4 10*3/uL (ref 0.1–1.0)
Monocytes Relative: 6.4 % (ref 3.0–12.0)
Neutro Abs: 4.8 10*3/uL (ref 1.4–7.7)
Neutrophils Relative %: 69.5 % (ref 43.0–77.0)
Platelets: 307 10*3/uL (ref 150.0–400.0)
RBC: 4.72 Mil/uL (ref 4.22–5.81)
RDW: 13.4 % (ref 11.5–15.5)
WBC: 6.9 10*3/uL (ref 4.0–10.5)

## 2021-06-07 LAB — COMPREHENSIVE METABOLIC PANEL
ALT: 25 U/L (ref 0–53)
AST: 24 U/L (ref 0–37)
Albumin: 4.6 g/dL (ref 3.5–5.2)
Alkaline Phosphatase: 52 U/L (ref 39–117)
BUN: 19 mg/dL (ref 6–23)
CO2: 30 mEq/L (ref 19–32)
Calcium: 9.6 mg/dL (ref 8.4–10.5)
Chloride: 101 mEq/L (ref 96–112)
Creatinine, Ser: 0.97 mg/dL (ref 0.40–1.50)
GFR: 79.32 mL/min (ref >60.00)
Glucose, Bld: 83 mg/dL (ref 70–99)
Potassium: 3.5 mEq/L (ref 3.5–5.1)
Sodium: 140 mEq/L (ref 135–145)
Total Bilirubin: 1.3 mg/dL — ABNORMAL HIGH (ref 0.2–1.2)
Total Protein: 7.2 g/dL (ref 6.0–8.3)

## 2021-06-07 LAB — LIPID PANEL
Cholesterol: 156 mg/dL (ref 0–200)
HDL: 46.6 mg/dL (ref >39.00)
LDL Cholesterol: 77 mg/dL (ref 0–99)
NonHDL: 109.77
Total CHOL/HDL Ratio: 3
Triglycerides: 166 mg/dL — ABNORMAL HIGH (ref 0.0–149.0)
VLDL: 33.2 mg/dL (ref 0.0–40.0)

## 2021-06-07 LAB — PSA: PSA: 0.44 ng/mL (ref 0.10–4.00)

## 2021-06-07 MED ORDER — PANTOPRAZOLE SODIUM 40 MG PO TBEC: 40.0000 mg | DELAYED_RELEASE_TABLET | Freq: Every day | ORAL | 3 refills | Status: DC

## 2021-06-07 MED ORDER — ATORVASTATIN CALCIUM 40 MG PO TABS: 40.0000 mg | ORAL_TABLET | Freq: Every day | ORAL | 1 refills | Status: DC

## 2021-06-07 MED ORDER — HYDROCHLOROTHIAZIDE 25 MG PO TABS: 25.0000 mg | ORAL_TABLET | Freq: Every day | ORAL | 1 refills | Status: DC

## 2021-06-07 MED ORDER — LOSARTAN POTASSIUM 25 MG PO TABS: 50.0000 mg | ORAL_TABLET | Freq: Every day | ORAL | 0 refills | Status: DC

## 2021-06-07 MED ORDER — FINASTERIDE 5 MG PO TABS: 5.0000 mg | ORAL_TABLET | Freq: Every day | ORAL | 1 refills | Status: DC

## 2021-06-07 NOTE — Assessment & Plan Note (Signed)
Encourage heart healthy diet such as MIND or DASH diet, increase exercise, avoid trans fats, simple carbohydrates and processed foods, consider a krill or fish or flaxseed oil cap daily.  °

## 2021-06-07 NOTE — Progress Notes (Signed)
Established Patient Office Visit  Subjective:  Patient ID: Lance Oneal, male    DOB: [birth year]  Age: 70 y.o. MRN: [id number]  CC:  Chief Complaint  Patient presents with   Annual Exam    Pt states fasting     HPI Lance Oneal presents for cpe and c/o dry mouth in am.  He is also f/u bp and cholesterol.  Pt also c/o urinary frequency and is requesting to see urology again   Past Medical History:  Diagnosis Date   Hyperlipidemia    Hypertension     Past Surgical History:  Procedure Laterality Date   HERNIA REPAIR      Family History  Problem Relation Age of Onset   Hypertension Mother    Liver cancer Father    Liver cancer Other    Colon cancer Neg Hx    Pancreatic cancer Neg Hx    Stomach cancer Neg Hx     Social History   Socioeconomic History   Marital status: Married    Spouse name: Not on file   Number of children: Not on file   Years of education: Not on file   Highest education level: Not on file  Occupational History   Not on file  Tobacco Use   Smoking status: Former    Packs/day: 0.50    Types: Cigarettes    Quit date: 02/26/2005    Years since quitting: 16.2   Smokeless tobacco: Never  Substance and Sexual Activity   Alcohol use: Yes   Drug use: No   Sexual activity: Yes    Partners: Female  Other Topics Concern   Not on file  Social History Narrative   Not on file   Social Determinants of Health   Financial Resource Strain: Not on file  Food Insecurity: Not on file  Transportation Needs: Not on file  Physical Activity: Not on file  Stress: Not on file  Social Connections: Not on file  Intimate Partner Violence: Not on file    Outpatient Medications Prior to Visit  Medication Sig Dispense Refill   fluticasone (FLONASE) 50 MCG/ACT nasal spray Place 2 sprays into both nostrils daily. 16 g 6   atorvastatin (LIPITOR) 40 MG tablet TAKE 1 TABLET BY MOUTH EVERY DAY 90 tablet 1   finasteride (PROSCAR) 5 MG tablet Take 1 tablet (5 mg total) by  mouth daily. 90 tablet 1   hydrochlorothiazide (HYDRODIURIL) 25 MG tablet Take 1 tablet (25 mg total) by mouth daily. NEEDS OV/FOLLOW UP BEFORE ANY MORE REFILLS 90 tablet 1   losartan (COZAAR) 25 MG tablet TAKE 2 TABLETS BY MOUTH EVERY DAY 180 tablet 0   pantoprazole (PROTONIX) 40 MG tablet TAKE 1 TABLET BY MOUTH EVERY DAY 90 tablet 1   No facility-administered medications prior to visit.    Allergies  Allergen Reactions   Lisinopril     Cough     ROS Review of Systems  Constitutional:  Negative for appetite change, diaphoresis, fatigue and unexpected weight change.  Eyes:  Negative for pain, redness and visual disturbance.  Respiratory:  Negative for cough, chest tightness, shortness of breath and wheezing.   Cardiovascular:  Negative for chest pain, palpitations and leg swelling.  Endocrine: Negative for cold intolerance, heat intolerance, polydipsia, polyphagia and polyuria.  Genitourinary:  Negative for difficulty urinating, dysuria and frequency.  Neurological:  Negative for dizziness, light-headedness, numbness and headaches.     Objective:    Physical Exam Vitals and nursing note reviewed.  Constitutional:  Appearance: He is well-developed.  HENT:     Head: Normocephalic and atraumatic.  Eyes:     Pupils: Pupils are equal, round, and reactive to light.  Neck:     Thyroid: No thyromegaly.  Cardiovascular:     Rate and Rhythm: Normal rate and regular rhythm.     Heart sounds: No murmur heard. Pulmonary:     Effort: Pulmonary effort is normal. No respiratory distress.     Breath sounds: Normal breath sounds. No wheezing or rales.  Chest:     Chest wall: No tenderness.  Musculoskeletal:        General: No tenderness.     Cervical back: Normal range of motion and neck supple.  Skin:    General: Skin is warm and dry.  Neurological:     Mental Status: He is alert and oriented to person, place, and time.  Psychiatric:        Behavior: Behavior normal.         Thought Content: Thought content normal.        Judgment: Judgment normal.    BP 100/80 (BP Location: Left Arm, Patient Position: Sitting, Cuff Size: Normal)   Pulse 77   Temp 97.7 F (36.5 C) (Oral)   Resp 18   Ht 5' (1.524 m)   Wt 122 lb 3.2 oz (55.4 kg)   SpO2 98%   BMI 23.87 kg/m  Wt Readings from Last 3 Encounters:  06/07/21 122 lb 3.2 oz (55.4 kg)  11/30/20 123 lb 3.2 oz (55.9 kg)  08/24/20 123 lb 9.6 oz (56.1 kg)     Health Maintenance Due  Topic Date Due   COLONOSCOPY (Pts 45-35yrs Insurance coverage will need to be confirmed)  Never done   Zoster Vaccines- Shingrix (2 of 2) 06/04/2018    There are no preventive care reminders to display for this patient.  Lab Results  Component Value Date   TSH 0.59 09/06/2016   Lab Results  Component Value Date   WBC 9.0 09/06/2016   HGB 14.0 09/06/2016   HCT 41.7 09/06/2016   MCV 89.2 09/06/2016   PLT 331.0 09/06/2016   Lab Results  Component Value Date   NA 142 11/30/2020   K 4.2 11/30/2020   CO2 29 11/30/2020   GLUCOSE 101 (H) 11/30/2020   BUN 27 (H) 11/30/2020   CREATININE 1.15 11/30/2020   BILITOT 1.2 11/30/2020   ALKPHOS 53 11/30/2020   AST 19 11/30/2020   ALT 20 11/30/2020   PROT 7.1 11/30/2020   ALBUMIN 4.5 11/30/2020   CALCIUM 9.9 11/30/2020   GFR 64.90 11/30/2020   Lab Results  Component Value Date   CHOL 171 11/30/2020   Lab Results  Component Value Date   HDL 42.10 11/30/2020   Lab Results  Component Value Date   LDLCALC 134 (H) 08/24/2020   Lab Results  Component Value Date   TRIG 254.0 (H) 11/30/2020   Lab Results  Component Value Date   CHOLHDL 4 11/30/2020   No results found for: HGBA1C    Assessment & Plan:   Problem List Items Addressed This Visit       Unprioritized   History of BPH   Relevant Medications   finasteride (PROSCAR) 5 MG tablet   Other Relevant Orders   PSA   Dyspepsia   Relevant Medications   pantoprazole (PROTONIX) 40 MG tablet   Benign prostatic  hyperplasia (BPH) with urinary urgency    F/u urology      Essential  hypertension    Well controlled, no changes to meds. Encouraged heart healthy diet such as the DASH diet and exercise as tolerated.       Relevant Medications   losartan (COZAAR) 25 MG tablet   hydrochlorothiazide (HYDRODIURIL) 25 MG tablet   atorvastatin (LIPITOR) 40 MG tablet   Other Relevant Orders   CBC with Differential/Platelet   Comprehensive metabolic panel   PSA   Lipid panel   Hyperlipidemia    Encourage heart healthy diet such as MIND or DASH diet, increase exercise, avoid trans fats, simple carbohydrates and processed foods, consider a krill or fish or flaxseed oil cap daily.       Relevant Medications   losartan (COZAAR) 25 MG tablet   hydrochlorothiazide (HYDRODIURIL) 25 MG tablet   atorvastatin (LIPITOR) 40 MG tablet   Other Relevant Orders   CBC with Differential/Platelet   Comprehensive metabolic panel   PSA   Lipid panel   Preventative health care    ghm utd Check labs       Other Visit Diagnoses     Benign prostatic hyperplasia with urinary frequency    -  Primary   Relevant Orders   Ambulatory referral to Urology   Gastroesophageal reflux disease, unspecified whether esophagitis present       Relevant Medications   pantoprazole (PROTONIX) 40 MG tablet   Other Relevant Orders   Ambulatory referral to Gastroenterology   Colon cancer screening       Relevant Orders   Cologuard       Meds ordered this encounter  Medications   losartan (COZAAR) 25 MG tablet    Sig: Take 2 tablets (50 mg total) by mouth daily.    Dispense:  180 tablet    Refill:  0   hydrochlorothiazide (HYDRODIURIL) 25 MG tablet    Sig: Take 1 tablet (25 mg total) by mouth daily.    Dispense:  90 tablet    Refill:  1   atorvastatin (LIPITOR) 40 MG tablet    Sig: Take 1 tablet (40 mg total) by mouth daily.    Dispense:  90 tablet    Refill:  1    Patient is on 40mg .  Please d/c 20mg  rx   finasteride  (PROSCAR) 5 MG tablet    Sig: Take 1 tablet (5 mg total) by mouth daily.    Dispense:  90 tablet    Refill:  1   pantoprazole (PROTONIX) 40 MG tablet    Sig: Take 1 tablet (40 mg total) by mouth daily.    Dispense:  90 tablet    Refill:  3    Follow-up: Return in about 6 months (around 12/05/2021) for hypertension, hyperlipidemia.    , DO

## 2021-06-07 NOTE — Assessment & Plan Note (Signed)
F/u urology.  

## 2021-06-07 NOTE — Progress Notes (Signed)
   Covid-19 Vaccination Clinic  Name:  Lance Oneal    MRN: [id number] DOB: 02-13-1951  06/07/2021  [name] was observed post Covid-19 immunization for 15 minutes without incident. He was provided with Vaccine Information Sheet and instruction to access the V-Safe system.   [name] was instructed to call 911 with any severe reactions post vaccine: Difficulty breathing  Swelling of face and throat  A fast heartbeat  A bad rash all over body  Dizziness and weakness   Immunizations Administered     Name Date Dose VIS Date Route   Moderna Covid-19 vaccine Bivalent Booster 06/07/2021 10:44 AM 0.5 mL 02/20/2021 Intramuscular   Manufacturer: Gala Murdoch   Lot: [id number]   NDC: 59163-846-65

## 2021-06-07 NOTE — Assessment & Plan Note (Signed)
Well controlled, no changes to meds. Encouraged heart healthy diet such as the DASH diet and exercise as tolerated.  °

## 2021-06-07 NOTE — Assessment & Plan Note (Signed)
ghm utd Check labs  

## 2021-06-07 NOTE — Patient Instructions (Signed)
Preventive Care 65 Years and Older, Male °Preventive care refers to lifestyle choices and visits with your health care provider that can promote health and wellness. Preventive care visits are also called wellness exams. °What can I expect for my preventive care visit? °Counseling °During your preventive care visit, your health care provider may ask about your: °Medical history, including: °Past medical problems. °Family medical history. °History of falls. °Current health, including: °Emotional well-being. °Home life and relationship well-being. °Sexual activity. °Memory and ability to understand (cognition). °Lifestyle, including: °Alcohol, nicotine or tobacco, and drug use. °Access to firearms. °Diet, exercise, and sleep habits. °Work and work environment. °Sunscreen use. °Safety issues such as seatbelt and bike helmet use. °Physical exam °Your health care provider will check your: °Height and weight. These may be used to calculate your BMI (body mass index). BMI is a measurement that tells if you are at a healthy weight. °Waist circumference. This measures the distance around your waistline. This measurement also tells if you are at a healthy weight and may help predict your risk of certain diseases, such as type 2 diabetes and high blood pressure. °Heart rate and blood pressure. °Body temperature. °Skin for abnormal spots. °What immunizations do I need? °Vaccines are usually given at various ages, according to a schedule. Your health care provider will recommend vaccines for you based on your age, medical history, and lifestyle or other factors, such as travel or where you work. °What tests do I need? °Screening °Your health care provider may recommend screening tests for certain conditions. This may include: °Lipid and cholesterol levels. °Diabetes screening. This is done by checking your blood sugar (glucose) after you have not eaten for a while (fasting). °Hepatitis C test. °Hepatitis B test. °HIV (human  immunodeficiency virus) test. °STI (sexually transmitted infection) testing, if you are at risk. °Lung cancer screening. °Colorectal cancer screening. °Prostate cancer screening. °Abdominal aortic aneurysm (AAA) screening. You may need this if you are a current or former smoker. °Talk with your health care provider about your test results, treatment options, and if necessary, the need for more tests. °Follow these instructions at home: °Eating and drinking ° °Eat a diet that includes fresh fruits and vegetables, whole grains, lean protein, and low-fat dairy products. Limit your intake of foods with high amounts of sugar, saturated fats, and salt. °Take vitamin and mineral supplements as recommended by your health care provider. °Do not drink alcohol if your health care provider tells you not to drink. °If you drink alcohol: °Limit how much you have to 0-2 drinks a day. °Know how much alcohol is in your drink. In the [place] one drink equals one 12 oz bottle of beer (355 mL), one 5 oz glass of wine (148 mL), or one 1½ oz glass of hard liquor (44 mL). °Lifestyle °Brush your teeth every morning and night with fluoride toothpaste. Floss one time each day. °Exercise for at least 30 minutes 5 or more days each week. °Do not use any products that contain nicotine or tobacco. These products include cigarettes, chewing tobacco, and vaping devices, such as e-cigarettes. If you need help quitting, ask your health care provider. °Do not use drugs. °If you are sexually active, practice safe sex. Use a condom or other form of protection to prevent STIs. °Take aspirin only as told by your health care provider. Make sure that you understand how much to take and what form to take. Work with your health care provider to find out whether it is safe and   beneficial for you to take aspirin daily. °Ask your health care provider if you need to take a cholesterol-lowering medicine (statin). °Find healthy ways to manage stress, such  as: °Meditation, yoga, or listening to music. °Journaling. °Talking to a trusted person. °Spending time with friends and family. °Safety °Always wear your seat belt while driving or riding in a vehicle. °Do not drive: °If you have been drinking alcohol. Do not ride with someone who has been drinking. °When you are tired or distracted. °While texting. °If you have been using any mind-altering substances or drugs. °Wear a helmet and other protective equipment during sports activities. °If you have firearms in your house, make sure you follow all gun safety procedures. °Minimize exposure to UV radiation to reduce your risk of skin cancer. °What's next? °Visit your health care provider once a year for an annual wellness visit. °Ask your health care provider how often you should have your eyes and teeth checked. °Stay up to date on all vaccines. °This information is not intended to replace advice given to you by your health care provider. Make sure you discuss any questions you have with your health care provider. °Document Revised: 12/23/2020 Document Reviewed: 12/23/2020 °Elsevier Patient Education © 2022 [employer] ° °

## 2021-06-12 ENCOUNTER — Other Ambulatory Visit (HOSPITAL_BASED_OUTPATIENT_CLINIC_OR_DEPARTMENT_OTHER): Payer: Self-pay

## 2021-06-12 MED ORDER — MODERNA COVID-19 BIVAL BOOSTER 50 MCG/0.5ML IM SUSP
INTRAMUSCULAR | 0 refills | Status: DC
  Filled 2021-06-12: qty 0.5, 1d supply, fill #0

## 2021-06-14 ENCOUNTER — Other Ambulatory Visit (HOSPITAL_BASED_OUTPATIENT_CLINIC_OR_DEPARTMENT_OTHER): Payer: Self-pay

## 2021-07-06 ENCOUNTER — Other Ambulatory Visit: Payer: Self-pay | Admitting: Urology

## 2021-07-28 ENCOUNTER — Encounter (HOSPITAL_BASED_OUTPATIENT_CLINIC_OR_DEPARTMENT_OTHER): Payer: Self-pay | Admitting: Urology

## 2021-07-29 ENCOUNTER — Encounter (HOSPITAL_BASED_OUTPATIENT_CLINIC_OR_DEPARTMENT_OTHER): Payer: Self-pay | Admitting: Urology

## 2021-07-29 ENCOUNTER — Other Ambulatory Visit: Payer: Self-pay

## 2021-07-29 NOTE — Progress Notes (Signed)
Spoke w/ via phone for pre-op interview--- pt thru [place] interpreter ID# [phone] Lab needs dos----  istat and ekg             Lab results------ no COVID test -----patient states asymptomatic no test needed Arrive at ------- 1130 on 07-30-2021 NPO after MN NO Solid Food.  Clear liquids from MN until--- 1030   Med rec completed Medications to take morning of surgery ----- none Diabetic medication ----- n/a  Patient instructed to bring photo id and insurance card day of surgery Patient aware to have Driver (ride ) / caregiver for 24 hours after surgery --wife, nga tran Patient Special Instructions ----- reviewed [facility] and visitor guidelines.  Per pt received information from Dr Berneice Heinrich office his understanding was to stop all medication, vitamins, supplements.  So pt stated he stopped all prescription medication's , this included his bp meds, 15 days ago. Pt stated he takes all meds at night time.  Pt verbalized understanding thru interpreter to take all his prescription medication tonight and hopefully his blood pressure will be okay in pre-op tomorrow for surgery, could possibly need to reschedule if too high.  Pre-Op special Istructions ----- requested [place] interpreter via email to [employer] interpreting . Pt wife will be with pt , aware to wait in waiting area until pt gets in his room, she will probably being staying the night.  Pt told food only provided for him not his wife.  Pt asked and stated he will be bringing his own food to eat, since he is allowed.   Pt is planning to drive to facility and will drive next day after discharged. Pt stated his son, Eron Staat, will be available if needed by phone [phone], he speaks and understands english.  Patient verbalized understanding of instructions that were given at this phone interview. Patient denies shortness of breath, chest pain, fever, cough at this phone interview.

## 2021-07-30 ENCOUNTER — Ambulatory Visit (HOSPITAL_BASED_OUTPATIENT_CLINIC_OR_DEPARTMENT_OTHER)
Admission: RE | Admit: 2021-07-30 | Discharge: 2021-07-31 | Disposition: A | Discharge status: Home / Self Care | Payer: Medicare HMO | Attending: Urology | Admitting: Urology

## 2021-07-30 ENCOUNTER — Ambulatory Visit (HOSPITAL_BASED_OUTPATIENT_CLINIC_OR_DEPARTMENT_OTHER): Payer: Medicare HMO

## 2021-07-30 ENCOUNTER — Encounter (HOSPITAL_BASED_OUTPATIENT_CLINIC_OR_DEPARTMENT_OTHER): Payer: Self-pay | Admitting: Urology

## 2021-07-30 ENCOUNTER — Encounter (HOSPITAL_BASED_OUTPATIENT_CLINIC_OR_DEPARTMENT_OTHER)
Admission: RE | Disposition: A | Discharge status: Home / Self Care | Payer: Self-pay | Source: Home / Self Care | Attending: Urology

## 2021-07-30 DIAGNOSIS — Z87891 Personal history of nicotine dependence: Secondary | ICD-10-CM | POA: Diagnosis not present

## 2021-07-30 DIAGNOSIS — E785 Hyperlipidemia, unspecified: Secondary | ICD-10-CM | POA: Diagnosis not present

## 2021-07-30 DIAGNOSIS — N433 Hydrocele, unspecified: Secondary | ICD-10-CM | POA: Insufficient documentation

## 2021-07-30 DIAGNOSIS — K219 Gastro-esophageal reflux disease without esophagitis: Secondary | ICD-10-CM | POA: Diagnosis not present

## 2021-07-30 DIAGNOSIS — R3915 Urgency of urination: Secondary | ICD-10-CM | POA: Diagnosis not present

## 2021-07-30 DIAGNOSIS — I10 Essential (primary) hypertension: Secondary | ICD-10-CM | POA: Insufficient documentation

## 2021-07-30 DIAGNOSIS — Z79899 Other long term (current) drug therapy: Secondary | ICD-10-CM | POA: Diagnosis not present

## 2021-07-30 DIAGNOSIS — N401 Enlarged prostate with lower urinary tract symptoms: Secondary | ICD-10-CM | POA: Insufficient documentation

## 2021-07-30 DIAGNOSIS — N400 Enlarged prostate without lower urinary tract symptoms: Secondary | ICD-10-CM | POA: Diagnosis present

## 2021-07-30 HISTORY — DX: Benign prostatic hyperplasia without lower urinary tract symptoms: N40.0

## 2021-07-30 HISTORY — DX: Urgency of urination: R39.15

## 2021-07-30 HISTORY — DX: Gastro-esophageal reflux disease without esophagitis: K21.9

## 2021-07-30 HISTORY — DX: Hydrocele, unspecified: N43.3

## 2021-07-30 HISTORY — PX: TRANSURETHRAL RESECTION OF PROSTATE: SHX73

## 2021-07-30 HISTORY — PX: HYDROCELE EXCISION: SHX482

## 2021-07-30 HISTORY — DX: Personal history of peptic ulcer disease: Z87.11

## 2021-07-30 LAB — POCT I-STAT, CHEM 8
BUN: 21 mg/dL (ref 8–23)
Calcium, Ion: 1.23 mmol/L (ref 1.15–1.40)
Chloride: 103 mmol/L (ref 98–111)
Creatinine, Ser: 1 mg/dL (ref 0.61–1.24)
Glucose, Bld: 103 mg/dL — ABNORMAL HIGH (ref 70–99)
HCT: 44 % (ref 39.0–52.0)
Hemoglobin: 15 g/dL (ref 13.0–17.0)
Potassium: 3.7 mmol/L (ref 3.5–5.1)
Sodium: 141 mmol/L (ref 135–145)
TCO2: 27 mmol/L (ref 22–32)

## 2021-07-30 SURGERY — TURP (TRANSURETHRAL RESECTION OF PROSTATE)
Anesthesia: General | Site: Scrotum | Laterality: Right

## 2021-07-30 MED ORDER — LOSARTAN POTASSIUM 50 MG PO TABS
50.0000 mg | ORAL_TABLET | Freq: Every day | ORAL | Status: DC
  Administered 2021-07-30: 50 mg via ORAL
  Filled 2021-07-30: qty 1

## 2021-07-30 MED ORDER — SODIUM CHLORIDE 0.9 % IR SOLN: 3000.0000 mL | Status: DC

## 2021-07-30 MED ORDER — MEPERIDINE HCL 25 MG/ML IJ SOLN: 6.2500 mg | INTRAMUSCULAR | Status: DC | PRN

## 2021-07-30 MED ORDER — ACETAMINOPHEN 500 MG PO TABS
ORAL_TABLET | ORAL | Status: AC
  Filled 2021-07-30: qty 2

## 2021-07-30 MED ORDER — LACTATED RINGERS IV SOLN: INTRAVENOUS | Status: DC

## 2021-07-30 MED ORDER — HYDROCHLOROTHIAZIDE 25 MG PO TABS
25.0000 mg | ORAL_TABLET | Freq: Every day | ORAL | Status: DC
  Administered 2021-07-30: 25 mg via ORAL
  Filled 2021-07-30: qty 1

## 2021-07-30 MED ORDER — SODIUM CHLORIDE 0.9 % IR SOLN
Status: DC | PRN
  Administered 2021-07-30 (×3): 3000 mL via INTRAVESICAL

## 2021-07-30 MED ORDER — FENTANYL CITRATE (PF) 100 MCG/2ML IJ SOLN
INTRAMUSCULAR | Status: DC | PRN
  Administered 2021-07-30: 100 ug via INTRAVENOUS
  Administered 2021-07-30: 50 ug via INTRAVENOUS

## 2021-07-30 MED ORDER — SENNOSIDES-DOCUSATE SODIUM 8.6-50 MG PO TABS
2.0000 | ORAL_TABLET | Freq: Every day | ORAL | Status: DC
  Administered 2021-07-30: 2 via ORAL
  Filled 2021-07-30: qty 2

## 2021-07-30 MED ORDER — HYDROMORPHONE HCL 1 MG/ML IJ SOLN: 0.5000 mg | INTRAMUSCULAR | Status: DC | PRN

## 2021-07-30 MED ORDER — MIDAZOLAM HCL 2 MG/2ML IJ SOLN: 0.5000 mg | Freq: Once | INTRAMUSCULAR | Status: DC | PRN

## 2021-07-30 MED ORDER — ONDANSETRON HCL 4 MG/2ML IJ SOLN
INTRAMUSCULAR | Status: DC | PRN
  Administered 2021-07-30: 4 mg via INTRAVENOUS

## 2021-07-30 MED ORDER — EPHEDRINE SULFATE-NACL 50-0.9 MG/10ML-% IV SOSY
PREFILLED_SYRINGE | INTRAVENOUS | Status: DC | PRN
Start: 2021-07-30 — End: 2021-07-30
  Administered 2021-07-30: 10 mg via INTRAVENOUS

## 2021-07-30 MED ORDER — CEFAZOLIN SODIUM-DEXTROSE 2-4 GM/100ML-% IV SOLN
INTRAVENOUS | Status: AC
  Filled 2021-07-30: qty 100

## 2021-07-30 MED ORDER — PANTOPRAZOLE SODIUM 40 MG PO TBEC
40.0000 mg | DELAYED_RELEASE_TABLET | Freq: Every day | ORAL | Status: DC
  Administered 2021-07-30: 40 mg via ORAL

## 2021-07-30 MED ORDER — BUPIVACAINE HCL (PF) 0.25 % IJ SOLN
INTRAMUSCULAR | Status: DC | PRN
  Administered 2021-07-30: 10 mL

## 2021-07-30 MED ORDER — FENTANYL CITRATE (PF) 100 MCG/2ML IJ SOLN
INTRAMUSCULAR | Status: AC
  Filled 2021-07-30: qty 2

## 2021-07-30 MED ORDER — PROPOFOL 10 MG/ML IV BOLUS
INTRAVENOUS | Status: DC | PRN
Start: 2021-07-30 — End: 2021-07-30
  Administered 2021-07-30: 150 mg via INTRAVENOUS

## 2021-07-30 MED ORDER — DEXAMETHASONE SODIUM PHOSPHATE 4 MG/ML IJ SOLN
INTRAMUSCULAR | Status: DC | PRN
  Administered 2021-07-30: 8 mg via INTRAVENOUS

## 2021-07-30 MED ORDER — PANTOPRAZOLE SODIUM 40 MG PO TBEC
DELAYED_RELEASE_TABLET | ORAL | Status: AC
  Filled 2021-07-30: qty 1

## 2021-07-30 MED ORDER — FENTANYL CITRATE (PF) 100 MCG/2ML IJ SOLN: 25.0000 ug | INTRAMUSCULAR | Status: DC | PRN

## 2021-07-30 MED ORDER — SODIUM CHLORIDE 0.9 % IV SOLN: INTRAVENOUS | Status: DC

## 2021-07-30 MED ORDER — ONDANSETRON HCL 4 MG/2ML IJ SOLN
4.0000 mg | INTRAMUSCULAR | Status: DC | PRN
  Administered 2021-07-30: 4 mg via INTRAVENOUS

## 2021-07-30 MED ORDER — OXYCODONE HCL 5 MG PO TABS
ORAL_TABLET | ORAL | Status: AC
  Filled 2021-07-30: qty 1

## 2021-07-30 MED ORDER — CEFAZOLIN SODIUM-DEXTROSE 2-4 GM/100ML-% IV SOLN
2.0000 g | INTRAVENOUS | Status: AC
  Administered 2021-07-30: 2 g via INTRAVENOUS

## 2021-07-30 MED ORDER — PHENYLEPHRINE 40 MCG/ML (10ML) SYRINGE FOR IV PUSH (FOR BLOOD PRESSURE SUPPORT)
PREFILLED_SYRINGE | INTRAVENOUS | Status: DC | PRN
  Administered 2021-07-30 (×4): 80 ug via INTRAVENOUS

## 2021-07-30 MED ORDER — LIDOCAINE HCL (CARDIAC) PF 100 MG/5ML IV SOSY
PREFILLED_SYRINGE | INTRAVENOUS | Status: DC | PRN
  Administered 2021-07-30: 60 mg via INTRAVENOUS

## 2021-07-30 MED ORDER — ATORVASTATIN CALCIUM 40 MG PO TABS
40.0000 mg | ORAL_TABLET | Freq: Every day | ORAL | Status: DC
  Administered 2021-07-30: 40 mg via ORAL
  Filled 2021-07-30: qty 1

## 2021-07-30 MED ORDER — PROMETHAZINE HCL 25 MG/ML IJ SOLN: 6.2500 mg | INTRAMUSCULAR | Status: DC | PRN

## 2021-07-30 MED ORDER — ONDANSETRON HCL 4 MG/2ML IJ SOLN
INTRAMUSCULAR | Status: AC
  Filled 2021-07-30: qty 2

## 2021-07-30 MED ORDER — DEXAMETHASONE SODIUM PHOSPHATE 10 MG/ML IJ SOLN
INTRAMUSCULAR | Status: AC
  Filled 2021-07-30: qty 1

## 2021-07-30 MED ORDER — OXYCODONE HCL 5 MG PO TABS: 5.0000 mg | ORAL_TABLET | Freq: Once | ORAL | Status: DC | PRN

## 2021-07-30 MED ORDER — ACETAMINOPHEN 500 MG PO TABS
1000.0000 mg | ORAL_TABLET | Freq: Once | ORAL | Status: AC
  Administered 2021-07-30: 1000 mg via ORAL

## 2021-07-30 MED ORDER — OXYCODONE-ACETAMINOPHEN 5-325 MG PO TABS: 1.0000 | ORAL_TABLET | Freq: Four times a day (QID) | ORAL | 0 refills | Status: DC | PRN

## 2021-07-30 MED ORDER — LIDOCAINE HCL (PF) 2 % IJ SOLN
INTRAMUSCULAR | Status: AC
  Filled 2021-07-30: qty 5

## 2021-07-30 MED ORDER — FINASTERIDE 5 MG PO TABS
5.0000 mg | ORAL_TABLET | Freq: Every day | ORAL | Status: DC
  Administered 2021-07-30: 5 mg via ORAL
  Filled 2021-07-30: qty 1

## 2021-07-30 MED ORDER — ACETAMINOPHEN 500 MG PO TABS
1000.0000 mg | ORAL_TABLET | Freq: Three times a day (TID) | ORAL | Status: DC
  Administered 2021-07-30 – 2021-07-31 (×2): 1000 mg via ORAL

## 2021-07-30 MED ORDER — SULFAMETHOXAZOLE-TRIMETHOPRIM 800-160 MG PO TABS: 1.0000 | ORAL_TABLET | Freq: Two times a day (BID) | ORAL | 0 refills | Status: DC

## 2021-07-30 MED ORDER — PROPOFOL 10 MG/ML IV BOLUS
INTRAVENOUS | Status: AC
  Filled 2021-07-30: qty 20

## 2021-07-30 MED ORDER — OXYCODONE HCL 5 MG/5ML PO SOLN: 5.0000 mg | Freq: Once | ORAL | Status: DC | PRN

## 2021-07-30 MED ORDER — OXYCODONE HCL 5 MG PO TABS
5.0000 mg | ORAL_TABLET | ORAL | Status: DC | PRN
  Administered 2021-07-30: 5 mg via ORAL

## 2021-07-30 SURGICAL SUPPLY — 62 items
BAG DRAIN URO-CYSTO SKYTR STRL (DRAIN) ×3 IMPLANT
BAG URINE DRAIN 2000ML AR STRL (UROLOGICAL SUPPLIES) ×3 IMPLANT
BAG URINE LEG 500ML (DRAIN) IMPLANT
BLADE CLIPPER SENSICLIP SURGIC (BLADE) ×2 IMPLANT
BLADE SURG 15 STRL LF DISP TIS (BLADE) ×2 IMPLANT
BLADE SURG 15 STRL SS (BLADE) ×2
BNDG GAUZE ELAST 4 BULKY (GAUZE/BANDAGES/DRESSINGS) ×2 IMPLANT
CATH FOLEY 2WAY SLVR 30CC 20FR (CATHETERS) IMPLANT
CATH FOLEY 3WAY 30CC 22FR (CATHETERS) IMPLANT
CATH FOLEY 3WAY 30CC 24FR (CATHETERS) ×1
CATH URTH STD 24FR FL 3W 2 (CATHETERS) IMPLANT
CLOTH BEACON ORANGE TIMEOUT ST (SAFETY) ×3 IMPLANT
COVER BACK TABLE 60X90IN (DRAPES) ×3 IMPLANT
COVER MAYO STAND STRL (DRAPES) ×3 IMPLANT
DERMABOND ADVANCED (GAUZE/BANDAGES/DRESSINGS)
DERMABOND ADVANCED .7 DNX12 (GAUZE/BANDAGES/DRESSINGS) IMPLANT
DISSECTOR ROUND CHERRY 3/8 STR (MISCELLANEOUS) IMPLANT
DRAIN PENROSE 0.25X18 (DRAIN) ×3 IMPLANT
DRAPE LAPAROTOMY 100X72 PEDS (DRAPES) ×3 IMPLANT
DRSG PAD ABDOMINAL 8X10 ST (GAUZE/BANDAGES/DRESSINGS) ×1 IMPLANT
DRSG TEGADERM 4X4.75 (GAUZE/BANDAGES/DRESSINGS) IMPLANT
ELECT BIVAP BIPO 22/24 DONUT (ELECTROSURGICAL)
ELECT REM PT RETURN 9FT ADLT (ELECTROSURGICAL) ×3
ELECTRD BIVAP BIPO 22/24 DONUT (ELECTROSURGICAL) IMPLANT
ELECTRODE REM PT RTRN 9FT ADLT (ELECTROSURGICAL) ×2 IMPLANT
GAUZE 4X4 16PLY ~~LOC~~+RFID DBL (SPONGE) ×3 IMPLANT
GLOVE SURG ENC MOIS LTX SZ7.5 (GLOVE) ×3 IMPLANT
GOWN STRL REUS W/TWL LRG LVL3 (GOWN DISPOSABLE) ×3 IMPLANT
GUIDEWIRE STR DUAL SENSOR (WIRE) IMPLANT
HOLDER FOLEY CATH W/STRAP (MISCELLANEOUS) ×3 IMPLANT
IV CATH 14GX2 1/4 (CATHETERS) IMPLANT
IV NS IRRIG 3000ML ARTHROMATIC (IV SOLUTION) ×9 IMPLANT
KIT TURNOVER CYSTO (KITS) ×3 IMPLANT
LOOP CUT BIPOLAR 24F LRG (ELECTROSURGICAL) ×1 IMPLANT
MANIFOLD NEPTUNE II (INSTRUMENTS) ×3 IMPLANT
NDL HYPO 25X1 1.5 SAFETY (NEEDLE) ×2 IMPLANT
NEEDLE HYPO 25X1 1.5 SAFETY (NEEDLE) ×3 IMPLANT
NS IRRIG 500ML POUR BTL (IV SOLUTION) ×1 IMPLANT
PACK BASIN DAY SURGERY FS (CUSTOM PROCEDURE TRAY) ×3 IMPLANT
PACK CYSTO (CUSTOM PROCEDURE TRAY) ×3 IMPLANT
PANTS MESH DISP LRG (UNDERPADS AND DIAPERS) ×2 IMPLANT
PANTS MESH DISPOSABLE L (UNDERPADS AND DIAPERS) ×1
PENCIL SMOKE EVACUATOR (MISCELLANEOUS) ×3 IMPLANT
SUT ETHILON 4 0 PS 2 18 (SUTURE) ×3 IMPLANT
SUT MNCRL AB 4-0 PS2 18 (SUTURE) ×3 IMPLANT
SUT VIC AB 2-0 SH 27 (SUTURE)
SUT VIC AB 2-0 SH 27XBRD (SUTURE) IMPLANT
SUT VIC AB 3-0 SH 27 (SUTURE) ×1
SUT VIC AB 3-0 SH 27X BRD (SUTURE) IMPLANT
SUT VIC AB 3-0 SH 27XBRD (SUTURE) ×2 IMPLANT
SUT VIC AB 4-0 SH 27 (SUTURE) ×1
SUT VIC AB 4-0 SH 27XANBCTRL (SUTURE) ×2 IMPLANT
SYR 30ML LL (SYRINGE) ×1 IMPLANT
SYR CONTROL 10ML LL (SYRINGE) ×3 IMPLANT
SYR TOOMEY IRRIG 70ML (MISCELLANEOUS) ×3
SYRINGE TOOMEY IRRIG 70ML (MISCELLANEOUS) ×2 IMPLANT
TOWEL OR 17X26 10 PK STRL BLUE (TOWEL DISPOSABLE) ×3 IMPLANT
TRAY DSU PREP LF (CUSTOM PROCEDURE TRAY) ×2 IMPLANT
TUBE CONNECTING 12X1/4 (SUCTIONS) ×3 IMPLANT
TUBING UROLOGY SET (TUBING) ×3 IMPLANT
WATER STERILE IRR 500ML POUR (IV SOLUTION) ×1 IMPLANT
YANKAUER SUCT BULB TIP NO VENT (SUCTIONS) ×3 IMPLANT

## 2021-07-30 NOTE — Transfer of Care (Signed)
Immediate Anesthesia Transfer of Care Note  Patient: Lance Oneal  Procedure(s) Performed: TRANSURETHRAL RESECTION OF THE PROSTATE (TURP) (Prostate) HYDROCELECTOMY ADULT AND RIGHT ORCHIOPEXY (Right: Scrotum)  Patient Location: PACU  Anesthesia Type:General  Level of Consciousness: drowsy  Airway & Oxygen Therapy: Patient Spontanous Breathing and Patient connected to face mask oxygen  Post-op Assessment: Report given to RN and Post -op Vital signs reviewed and stable  Post vital signs: Reviewed and stable  Last Vitals:  Vitals Value Taken Time  BP    Temp    Pulse 71 07/30/21 1422  Resp    SpO2 100 % 07/30/21 1422  Vitals shown include unvalidated device data.  Last Pain:  Vitals:   07/30/21 1153  TempSrc: Oral  PainSc: 0-No pain      Patients Stated Pain Goal: 4 (07/30/21 1153)  Complications: No notable events documented.

## 2021-07-30 NOTE — Brief Op Note (Signed)
07/30/2021  2:07 PM  PATIENT:  Lance Oneal  71 y.o. male  PRE-OPERATIVE DIAGNOSIS:  RIGHT HYDROCELE, REFRACTORY URINARY URGENCY  POST-OPERATIVE DIAGNOSIS:  RIGHT HYDROCELE, REFRACTORY URINARY URGENCY  PROCEDURE:  Procedure(s): TRANSURETHRAL RESECTION OF THE PROSTATE (TURP) (N/A) HYDROCELECTOMY ADULT AND RIGHT ORCHIOPEXY (Right)  SURGEON:  Surgeon(s) and Role:    Sebastian Ache, MD - Primary  PHYSICIAN ASSISTANT:   ASSISTANTS: none   ANESTHESIA:   local and general  EBL:    BLOOD ADMINISTERED:none  DRAINS:  1 - 3 way foley to NS irrigation; 2 - penrose in dependandt scrotum to wound drainage    LOCAL MEDICATIONS USED:  MARCAINE     SPECIMEN:  Source of Specimen:  prostate chips -path;   DISPOSITION OF SPECIMEN:  PATHOLOGY  COUNTS:  YES  TOURNIQUET:  * No tourniquets in log *  DICTATION: .Other Dictation: Dictation Number [id number]  PLAN OF CARE: Admit for overnight observation  PATIENT DISPOSITION:  PACU - hemodynamically stable.   Delay start of Pharmacological VTE agent (>24hrs) due to surgical blood loss or risk of bleeding: yes

## 2021-07-30 NOTE — Anesthesia Procedure Notes (Signed)
Procedure Name: LMA Insertion Date/Time: 07/30/2021 12:55 PM Performed by: Caren Macadam, CRNA Pre-anesthesia Checklist: Patient identified, Emergency Drugs available, Suction available and Patient being monitored Patient Re-evaluated:Patient Re-evaluated prior to induction Oxygen Delivery Method: Circle system utilized Preoxygenation: Pre-oxygenation with 100% oxygen Induction Type: IV induction Ventilation: Mask ventilation without difficulty LMA: LMA inserted LMA Size: 4.0 Number of attempts: 1 Placement Confirmation: positive ETCO2 and breath sounds checked- equal and bilateral Tube secured with: Tape Dental Injury: Teeth and Oropharynx as per pre-operative assessment

## 2021-07-30 NOTE — H&P (Signed)
Lance Oneal is an 71 y.o. male.    Chief Complaint: Pre-OP RIGHT Hydrocelectomy + Transurethral Resection of Prostate  HPI:   1 - Rt Hydrocele - 20 year history of rt scrotal pain that comes and goes. 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 [place] 05/2013 with large rt hydroceleup to 5.8cm w/o nodules. 2022 bother increasing some and size as well and repeat scrotal US with size progression to 9cm, no overt hernia. CT 2022 confirms no associated hernia or complex features.    2 - Enlarged Prostate with Urinary Urgency - years of progressive obstructive and irritative symptoms with weak stream and frequency / urgency. DRE 45gm. PVR 2022 "27mL".UA 2022 normal. No help and nasal congestion with alpha blockers. Now on finasteride QD since 2014. He is on HCTZ. Prostate vol 35gm by CT 2022. Cysto 2022 without stricures or complicating features.    PMH sig for HTN, benign gastric surgery (large midline scar). His PCP is Garnet Koyanagi MD with [facility].    Today Lance Oneal is seen to proceed with turp and RIGHT hydrocelectomy. NO interval fevers. Most recent UA without infectious parameters.    Past Medical History:  Diagnosis Date   BPH (benign prostatic hyperplasia)    GERD (gastroesophageal reflux disease)    History of gastric ulcer    per pt 1970s or 1980s s/p  open surgery for stomach ulcer   Hyperlipidemia    Hypertension    Right hydrocele    Urinary urgency    refractory    Past Surgical History:  Procedure Laterality Date   ABDOMINAL SURGERY     per pt done approxl 1970s or 1980s  for stomach ulcer (has large midline incision)    Family History  Problem Relation Age of Onset   Hypertension Mother    Liver cancer Father    Liver cancer Other    Colon cancer Neg Hx    Pancreatic cancer Neg Hx    Stomach cancer Neg Hx    Social History:  reports that he quit smoking about 16 years ago. His smoking use included cigarettes. He smoked an average  of .5 packs per day. He has never used smokeless tobacco. He reports current alcohol use. He reports that he does not use drugs.  Allergies:  Allergies  Allergen Reactions   Lisinopril     Cough     No medications prior to admission.    No results found for this or any previous visit (from the past 48 hour(s)). No results found.  Review of Systems  Constitutional:  Negative for chills and fever.  HENT: Negative.    Eyes: Negative.   Respiratory: Negative.    Cardiovascular: Negative.   Gastrointestinal: Negative.   Endocrine: Negative.   Genitourinary:  Positive for difficulty urinating, scrotal swelling and urgency.  Skin: Negative.   Allergic/Immunologic: Negative.   Neurological: Negative.   All other systems reviewed and are negative.  Height 5' (1.524 m), weight 55.4 kg. Physical Exam Constitutional:      Comments: INterviewed today with [place] Forensic scientist. He speaks fairly good [place] as well.   HENT:     Head: Normocephalic.     Nose: Nose normal.     Mouth/Throat:     Mouth: Mucous membranes are moist.  Eyes:     Pupils: Pupils are equal, round, and reactive to light.  Cardiovascular:     Rate and Rhythm: Normal rate.  Pulmonary:     Effort: Pulmonary effort  is normal.  Abdominal:     General: Abdomen is flat.  Genitourinary:    Comments: No CVAT. Stable Rt large spermatocele Musculoskeletal:        General: Normal range of motion.     Cervical back: Normal range of motion.  Skin:    General: Skin is warm.  Neurological:     General: No focal deficit present.     Mental Status: He is alert.  Psychiatric:        Mood and Affect: Mood normal.     Assessment/Plan  Proceed as planned with RIGHT hydrocelectomy and TURP for expanding hydrocele and med-refractory lower urinary tract symptoms. Risks, benefits, alternatives, expected peri-op course discussed previously and reiterated today.   Alexis Frock, MD 07/30/2021, 7:47 AM

## 2021-07-30 NOTE — Progress Notes (Signed)
TC to son Bren Steers at [phone]. Informed son patient will be discharged home with foley catheter and penrose drain, son was not aware of this plan. Informed son hospital interpreter has already been arranged to come for the hours of 0800-1000 on 07/31/2021 for anticipated D/C home and to assist with D/C teaching and foley catheter care teaching to patient and wife. Encouraged son to be present for teaching as well; pt given phone # and address for facility. States he will be present.

## 2021-07-30 NOTE — Anesthesia Postprocedure Evaluation (Signed)
Anesthesia Post Note  Patient: Lance Oneal  Procedure(s) Performed: TRANSURETHRAL RESECTION OF THE PROSTATE (TURP) (Prostate) HYDROCELECTOMY ADULT AND RIGHT ORCHIOPEXY (Right: Scrotum)     Patient location during evaluation: PACU Anesthesia Type: General Level of consciousness: awake and alert, patient cooperative and oriented Pain management: pain level controlled Vital Signs Assessment: post-procedure vital signs reviewed and stable Respiratory status: spontaneous breathing, nonlabored ventilation and respiratory function stable Cardiovascular status: blood pressure returned to baseline and stable Postop Assessment: no apparent nausea or vomiting Anesthetic complications: no   No notable events documented.  Last Vitals:  Vitals:   07/30/21 1500 07/30/21 1515  BP: 124/79 138/70  Pulse: 82 78  Resp: 15 16  Temp: (!) 36.4 C (!) 36.3 C  SpO2: 99% 100%    Last Pain:  Vitals:   07/30/21 1515  TempSrc:   PainSc: 0-No pain                 Adalyna Godbee,E. Kess Mcilwain

## 2021-07-30 NOTE — Discharge Instructions (Addendum)
1 - You may have urinary urgency (bladder spasms) and bloody urine on / off for up to 2 weeks. This is normal.  2 - Small bloody drainage will come form the scrotal drain until removed. This is normal.   3 - Call MD or go to ER for fever >102, severe pain / nausea / vomiting not relieved by medications, or acute change in medical status

## 2021-07-30 NOTE — Anesthesia Preprocedure Evaluation (Signed)
Anesthesia Evaluation  Patient identified by MRN, date of birth, ID band Patient awake    Reviewed: Allergy & Precautions, NPO status , Patient's Chart, lab work & pertinent test results  History of Anesthesia Complications Negative for: history of anesthetic complications  Airway Mallampati: I  TM Distance: >3 FB Neck ROM: Full    Dental  (+) Poor Dentition, Missing, Chipped, Dental Advisory Given   Pulmonary former smoker,    breath sounds clear to auscultation       Cardiovascular hypertension, Pt. on medications (-) angina Rhythm:Regular Rate:Normal     Neuro/Psych negative neurological ROS     GI/Hepatic Neg liver ROS, GERD  Medicated and Controlled,  Endo/Other  negative endocrine ROS  Renal/GU negative Renal ROS   Hydrocele, BPH    Musculoskeletal   Abdominal   Peds  Hematology negative hematology ROS (+)   Anesthesia Other Findings   Reproductive/Obstetrics                             Anesthesia Physical Anesthesia Plan  ASA: 2  Anesthesia Plan: General   Post-op Pain Management: Tylenol PO (pre-op)   Induction: Intravenous  PONV Risk Score and Plan: 2 and Ondansetron and Dexamethasone  Airway Management Planned: LMA  Additional Equipment: None  Intra-op Plan:   Post-operative Plan:   Informed Consent: I have reviewed the patients History and Physical, chart, labs and discussed the procedure including the risks, benefits and alternatives for the proposed anesthesia with the patient or authorized representative who has indicated his/her understanding and acceptance.     Dental advisory given  Plan Discussed with: CRNA and Surgeon  Anesthesia Plan Comments:         Anesthesia Quick Evaluation

## 2021-07-31 DIAGNOSIS — N401 Enlarged prostate with lower urinary tract symptoms: Secondary | ICD-10-CM | POA: Diagnosis not present

## 2021-07-31 LAB — HEMOGLOBIN AND HEMATOCRIT, BLOOD
HCT: 37.6 % — ABNORMAL LOW (ref 39.0–52.0)
Hemoglobin: 12.7 g/dL — ABNORMAL LOW (ref 13.0–17.0)

## 2021-07-31 MED ORDER — ACETAMINOPHEN 500 MG PO TABS
ORAL_TABLET | ORAL | Status: AC
  Filled 2021-07-31: qty 2

## 2021-07-31 MED ORDER — ONDANSETRON HCL 4 MG/2ML IJ SOLN
INTRAMUSCULAR | Status: AC
  Filled 2021-07-31: qty 2

## 2021-07-31 NOTE — Op Note (Signed)
Lance Oneal, Lance Oneal MEDICAL RECORD NO: [id number] ACCOUNT NO: [id number] DATE OF BIRTH: 11-16-1950 FACILITY: [facility] LOCATION: [facility] PHYSICIAN: Lance Ache, MD  Operative Report   DATE OF PROCEDURE: 07/30/2021  PREOPERATIVE DIAGNOSES:    1.  Prostatic hypertrophy medication refractory. 2.  Enlarging right hydrocele.  PROCEDURES:    1.  Transurethral resection of the prostate. 2.  Right hydrocelectomy. 3.  Right orchidopexy.  ESTIMATED BLOOD LOSS:  100 mL.  COMPLICATIONS:  None.  SPECIMENS:   1.  Prostate chips for permanent pathology. 2.  Hydrocele sac were discarded.  FINDINGS:   1.  Moderate bilobar prostatic hypertrophy with kissing lobes, pretransurethral resection. 2.  Complete resolution of all obstructing prostatic tissue, post-transurethral resection. 3.  300 mL volume simple right hydrocele. 4.  Excessive right testicular mobility, necessitating orchidopexy to prevent future torsion on the right.  DRAINS: 1.  Foley catheter to normal saline irrigation.  Efflux light pink, 20 mL  in the balloon. 2.  Penrose drain deep in the scrotum to wound drainage.  PREOPERATIVE INDICATIONS:  The patient is a very pleasant 71 year old [place] man with a multiple year history of progressive obstructive and irritative voiding as well as an enlarging right hydrocele.  He has been managed medically for a number of  years and had done very well initially; however, he is bothered from both his hydrocele, which is expanding in terms of volume and from his irritative voiding, obstructive voiding, have progressed to the point that he is more interested in surgical  therapy.  He underwent evaluation with imaging, which corroborated a simple hydrocele without any obvious associated hernias.  He does have a minimal residual urine.  He underwent cystoscopy, which ruled out any other sources of acute obstruction.   Options were discussed including aggressive path with a transurethral resection  given his prostate less than 50 grams, concomitantly with right hydrocelectomy. He wished to proceed.  Informed consent was obtained and placed in medical record.  PROCEDURE IN DETAIL:  The patient as being Lance Oneal verified. Procedure being right hydrocelectomy and transurethral resection of prostate was confirmed.  Procedure timeout was performed.  Intravenous antibiotics were administered.  General LMA anesthesia  was induced.  The patient was placed in a medium lithotomy position.  Sterile field was created prepping and draping the patient's penis, perineum, and proximal thighs using iodine.  Cystourethroscopy was performed using a 26-French resectoscope sheath  with visual obturator.  Inspection of the anterior and posterior urethra revealed only some moderate bilobar prostatic hypertrophy with kissing lobes as anticipated.  There was minimal bladder trabeculation.  Ureteral orifices were singleton.  No  evidence of bladder calculi or papillary lesions.  Using resectoscope loop bipolar energy under normal saline irrigation, a very careful transurethral resection was performed in a top down fashion.  First, the 12 o'clock position from the bladder neck  towards the area of the verumontanum down to the superficial fibromuscular stroma of the prostatic capsule followed by resection of the right lobe in a similar fashion and then the left lobe in a similar fashion, taking exquisite care to avoid  undermining of the bladder neck.  This resulted in excellent wide open channel from the area of the bladder neck to the verumontanum and exquisite care was taken to avoid resection distal to this.  Prostate chips were inherently generated, irrigated and  set aside for permanent pathology.  There is no evidence of any bladder perforation.  All prostate chips had been irrigated. The entire prostatic fossa  was very carefully fulgurated in a descending spiral type fashion, which resulted in fantastic  hemostasis of the  resection bed.  A sensor wire was placed to the level of the urinary bladder, over which a 3-way Foley catheter was carefully placed in a council type fashion to maximally avoid trauma to the prostatic fossa.  A 20 mL sterile water was  placed in the balloon.  This was connected to NS irrigation with the efflux being very light pink on incredibly gentle traction.  I was quite happy with the transurethral resection portion of the surgery today and as such, it was felt that it would be safe to proceed with the  hydrocelectomy.  Surgeon and English as a second language teacher. The scrotal area was reprepped. Incision was made approximately 5 cm along the median raphe and the anterior inferior scrotum. Dissection proceeded directly into the right hemiscrotum directly  onto the area of hydrocele, which was then carefully manipulated out of the incision site.  A small incision was made into this and it was drained of 300 mL of simple straw-colored fluid.  The hydrocele sac was then further incised, everted.  Excess  hydrocele sac trimmed and the edges were then reapproximated using running Vicryl stitches.  The edges remained everted to maximally prevent recurrence of this structure.  Given the relatively large volume hydrocele and then inherently a significant  testicular mobility, I was quite concerned about the possibility of future torsion, so that orchiopexy would be most prudent to prevent this. As such, the testicle was lying to anatomic location with the lateral sulcus lateral and a 4-0 Prolene stitch  was used to pex the mesorchium testis into the anterolateral leaflet of the right hemiscrotum with a separate anchoring point inferiorly, thus providing two-point anchoring of the testicle to prevent torsion.  I was quite happy with this anchoring in normal anatomic  position.  A small counter incision was then made in the inferior most portion of the scrotum through which a quarter inch Penrose drain was placed.  A  drain stitch of silk was applied.  The dartos was reapproximated using running Vicryl.  Skin was  reapproximated using running Monocryl.  Next, 10 mL of 0.25% plain Marcaine was infiltrated along the line of the scrotal incision.  A dressing of ABD pad, followed by mesh underwear was placed.  The Foley catheter was continued on very light traction on  normal saline irrigation and the procedure was terminated.  The patient tolerated the procedure well. No immediate perioperative complications.  The patient was taken to postanesthesia care unit in stable condition.  Plan for observation admission, likely discharged home tomorrow.   [facility] D: 07/30/2021 2:14:34 pm T: 07/31/2021 12:47:00 am  JOB: [id number]/ [id number]

## 2021-07-31 NOTE — Progress Notes (Addendum)
Dr. Liliane Shi in.    Awaiting son and translater to give discharge instructions. IV d/c.  CBI stopped and port plugged.  Pt refused to ambulate until he eats breakfast.

## 2021-07-31 NOTE — Discharge Summary (Signed)
Date of admission: 07/30/2021  Date of discharge: 07/31/2021  Admission diagnosis: BPH with LUTS, right hydrocele  Discharge diagnosis: Same  Procedures: TURP and right hydrocelectomy with Dr. Berneice Heinrich  History and Physical: For full details, please see admission history and physical. Briefly, Lance Oneal is a 71 y.o. year old patient with BPH with worsening LUTS as well as an enlarging right hydrocele.  He is status post TURP and right hydrocelectomy with Dr. Kathrynn Running on 07/30/2021.  Hospital Course: Postoperatively, the patient was monitored on the floor.  His urine was draining clear-yellow urine with minimal CBI.  He had no complaints of surgical pain or nausea/vomiting.  He was discharged home with his Foley catheter and Penrose drain with plans for a voiding trial and drain removal on 08/02/2021 in the office.  Physical Exam:  General: Alert and oriented Incisions: Scrotal incision is clean, dry and intact GU: Foley catheter in place and draining clear-yellow urine with minimal CBI Ext: NT, No erythema  Laboratory values:  Recent Labs    07/30/21 1201 07/31/21 0125  HGB 15.0 12.7*  HCT 44.0 37.6*   Recent Labs    07/30/21 1201  CREATININE 1.00    Disposition: Home  Discharge instruction: The patient was instructed to be ambulatory but told to refrain from heavy lifting, strenuous activity, or driving.  Discharge medications:  Allergies as of 07/31/2021       Reactions   Lisinopril    Cough        Medication List     TAKE these medications    atorvastatin 40 MG tablet Commonly known as: LIPITOR Take 1 tablet (40 mg total) by mouth daily. What changed: when to take this   finasteride 5 MG tablet Commonly known as: PROSCAR Take 1 tablet (5 mg total) by mouth daily. What changed: when to take this   fluticasone 50 MCG/ACT nasal spray Commonly known as: FLONASE Place 2 sprays into both nostrils daily.   hydrochlorothiazide 25 MG tablet Commonly known as:  HYDRODIURIL Take 1 tablet (25 mg total) by mouth daily. What changed: when to take this   losartan 25 MG tablet Commonly known as: COZAAR Take 2 tablets (50 mg total) by mouth daily. What changed: when to take this   oxyCODONE-acetaminophen 5-325 MG tablet Commonly known as: Percocet Take 1 tablet by mouth every 6 (six) hours as needed for severe pain or moderate pain (post-operatively).   pantoprazole 40 MG tablet Commonly known as: PROTONIX Take 1 tablet (40 mg total) by mouth daily. What changed: when to take this   sulfamethoxazole-trimethoprim 800-160 MG tablet Commonly known as: BACTRIM DS Take 1 tablet by mouth 2 (two) times daily. To prevent infection with catheter and drain in place               Discharge Care Instructions  (From admission, onward)           Start     Ordered   07/31/21 0000  Discharge wound care:       Comments: Keep incision clean and dry.  Okay to shower   07/31/21 0733            Followup:   Follow-up Information     Sebastian Ache, MD Follow up on 08/02/2021.   Specialty: Urology Why: at 10:45 for drain and catheter removal Contact information: [address] [place] [address] [phone]

## 2021-07-31 NOTE — Progress Notes (Signed)
Discharge instructions given to pt, son and through the interpreter w/ voiced understanding.  All questions answered.

## 2021-08-02 ENCOUNTER — Encounter (HOSPITAL_BASED_OUTPATIENT_CLINIC_OR_DEPARTMENT_OTHER): Payer: Self-pay | Admitting: Urology

## 2021-08-02 LAB — SURGICAL PATHOLOGY

## 2021-08-10 ENCOUNTER — Other Ambulatory Visit: Payer: Self-pay | Admitting: Family Medicine

## 2021-08-10 DIAGNOSIS — E785 Hyperlipidemia, unspecified: Secondary | ICD-10-CM

## 2021-08-12 ENCOUNTER — Other Ambulatory Visit: Payer: Self-pay | Admitting: Family Medicine

## 2021-08-12 DIAGNOSIS — J301 Allergic rhinitis due to pollen: Secondary | ICD-10-CM

## 2021-09-07 ENCOUNTER — Encounter: Payer: Self-pay | Admitting: Family Medicine

## 2021-10-18 ENCOUNTER — Other Ambulatory Visit: Payer: Self-pay | Admitting: Family Medicine

## 2021-10-18 DIAGNOSIS — I10 Essential (primary) hypertension: Secondary | ICD-10-CM

## 2021-10-26 ENCOUNTER — Ambulatory Visit (INDEPENDENT_AMBULATORY_CARE_PROVIDER_SITE_OTHER): Payer: Medicare HMO

## 2021-10-26 ENCOUNTER — Ambulatory Visit: Payer: Medicare HMO

## 2021-10-26 VITALS — BP 128/80 | HR 65 | Temp 97.9°F | Resp 16 | Ht 60.0 in | Wt 123.0 lb

## 2021-10-26 DIAGNOSIS — Z0000 Encounter for general adult medical examination without abnormal findings: Secondary | ICD-10-CM

## 2021-10-26 NOTE — Patient Instructions (Signed)
Lance Oneal , ?Thank you for taking time to come for your Medicare Wellness Visit. I appreciate your ongoing commitment to your health goals. Please review the following plan we discussed and let me know if I can assist you in the future.  ? ?Screening recommendations/referrals: ?Colonoscopy: declined ?Recommended yearly ophthalmology/optometry visit for glaucoma screening and checkup ?Recommended yearly dental visit for hygiene and checkup ? ?Vaccinations: ?Influenza vaccine: up to date ?Pneumococcal vaccine: up to date ?Tdap vaccine: up to date ?Shingles vaccine: up to date   ?Covid-19: completed ? ?Advanced directives: no ? ?Conditions/risks identified: see problem list ? ?Next appointment: Follow up in one year for your annual wellness visit. 10/28/22 ? ?Preventive Care 71 Years and Older, Male ?Preventive care refers to lifestyle choices and visits with your health care provider that can promote health and wellness. ?What does preventive care include? ?A yearly physical exam. This is also called an annual well check. ?Dental exams once or twice a year. ?Routine eye exams. Ask your health care provider how often you should have your eyes checked. ?Personal lifestyle choices, including: ?Daily care of your teeth and gums. ?Regular physical activity. ?Eating a healthy diet. ?Avoiding tobacco and drug use. ?Limiting alcohol use. ?Practicing safe sex. ?Taking low doses of aspirin every day. ?Taking vitamin and mineral supplements as recommended by your health care provider. ?What happens during an annual well check? ?The services and screenings done by your health care provider during your annual well check will depend on your age, overall health, lifestyle risk factors, and family history of disease. ?Counseling  ?Your health care provider may ask you questions about your: ?Alcohol use. ?Tobacco use. ?Drug use. ?Emotional well-being. ?Home and relationship well-being. ?Sexual activity. ?Eating habits. ?History of  falls. ?Memory and ability to understand (cognition). ?Work and work Astronomer. ?Screening  ?You may have the following tests or measurements: ?Height, weight, and BMI. ?Blood pressure. ?Lipid and cholesterol levels. These may be checked every 5 years, or more frequently if you are over 60 years old. ?Skin check. ?Lung cancer screening. You may have this screening every year starting at age 44 if you have a 30-pack-year history of smoking and currently smoke or have quit within the past 15 years. ?Fecal occult blood test (FOBT) of the stool. You may have this test every year starting at age 17. ?Flexible sigmoidoscopy or colonoscopy. You may have a sigmoidoscopy every 5 years or a colonoscopy every 10 years starting at age 21. ?Prostate cancer screening. Recommendations will vary depending on your family history and other risks. ?Hepatitis C blood test. ?Hepatitis B blood test. ?Sexually transmitted disease (STD) testing. ?Diabetes screening. This is done by checking your blood sugar (glucose) after you have not eaten for a while (fasting). You may have this done every 1-3 years. ?Abdominal aortic aneurysm (AAA) screening. You may need this if you are a current or former smoker. ?Osteoporosis. You may be screened starting at age 86 if you are at high risk. ?Talk with your health care provider about your test results, treatment options, and if necessary, the need for more tests. ?Vaccines  ?Your health care provider may recommend certain vaccines, such as: ?Influenza vaccine. This is recommended every year. ?Tetanus, diphtheria, and acellular pertussis (Tdap, Td) vaccine. You may need a Td booster every 10 years. ?Zoster vaccine. You may need this after age 39. ?Pneumococcal 13-valent conjugate (PCV13) vaccine. One dose is recommended after age 27. ?Pneumococcal polysaccharide (PPSV23) vaccine. One dose is recommended after age 2. ?Talk to  your health care provider about which screenings and vaccines you need and  how often you need them. ?This information is not intended to replace advice given to you by your health care provider. Make sure you discuss any questions you have with your health care provider. ?Document Released: 07/24/2015 Document Revised: 03/16/2016 Document Reviewed: 04/28/2015 ?Elsevier Interactive Patient Education ? 2017 Elsevier Inc[employer] ? ?Fall Prevention in the Home ?Falls can cause injuries. They can happen to people of all ages. There are many things you can do to make your home safe and to help prevent falls. ?What can I do on the outside of my home? ?Regularly fix the edges of walkways and driveways and fix any cracks. ?Remove anything that might make you trip as you walk through a door, such as a raised step or threshold. ?Trim any bushes or trees on the path to your home. ?Use bright outdoor lighting. ?Clear any walking paths of anything that might make someone trip, such as rocks or tools. ?Regularly check to see if handrails are loose or broken. Make sure that both sides of any steps have handrails. ?Any raised decks and porches should have guardrails on the edges. ?Have any leaves, snow, or ice cleared regularly. ?Use sand or salt on walking paths during winter. ?Clean up any spills in your garage right away. This includes oil or grease spills. ?What can I do in the bathroom? ?Use night lights. ?Install grab bars by the toilet and in the tub and shower. Do not use towel bars as grab bars. ?Use non-skid mats or decals in the tub or shower. ?If you need to sit down in the shower, use a plastic, non-slip stool. ?Keep the floor dry. Clean up any water that spills on the floor as soon as it happens. ?Remove soap buildup in the tub or shower regularly. ?Attach bath mats securely with double-sided non-slip rug tape. ?Do not have throw rugs and other things on the floor that can make you trip. ?What can I do in the bedroom? ?Use night lights. ?Make sure that you have a light by your bed that is easy to  reach. ?Do not use any sheets or blankets that are too big for your bed. They should not hang down onto the floor. ?Have a firm chair that has side arms. You can use this for support while you get dressed. ?Do not have throw rugs and other things on the floor that can make you trip. ?What can I do in the kitchen? ?Clean up any spills right away. ?Avoid walking on wet floors. ?Keep items that you use a lot in easy-to-reach places. ?If you need to reach something above you, use a strong step stool that has a grab bar. ?Keep electrical cords out of the way. ?Do not use floor polish or wax that makes floors slippery. If you must use wax, use non-skid floor wax. ?Do not have throw rugs and other things on the floor that can make you trip. ?What can I do with my stairs? ?Do not leave any items on the stairs. ?Make sure that there are handrails on both sides of the stairs and use them. Fix handrails that are broken or loose. Make sure that handrails are as long as the stairways. ?Check any carpeting to make sure that it is firmly attached to the stairs. Fix any carpet that is loose or worn. ?Avoid having throw rugs at the top or bottom of the stairs. If you do have throw rugs, attach  them to the floor with carpet tape. ?Make sure that you have a light switch at the top of the stairs and the bottom of the stairs. If you do not have them, ask someone to add them for you. ?What else can I do to help prevent falls? ?Wear shoes that: ?Do not have high heels. ?Have rubber bottoms. ?Are comfortable and fit you well. ?Are closed at the toe. Do not wear sandals. ?If you use a stepladder: ?Make sure that it is fully opened. Do not climb a closed stepladder. ?Make sure that both sides of the stepladder are locked into place. ?Ask someone to hold it for you, if possible. ?Clearly mark and make sure that you can see: ?Any grab bars or handrails. ?First and last steps. ?Where the edge of each step is. ?Use tools that help you move  around (mobility aids) if they are needed. These include: ?Canes. ?Walkers. ?Scooters. ?Crutches. ?Turn on the lights when you go into a dark area. Replace any light bulbs as soon as they burn out. ?Set up your fur

## 2021-10-26 NOTE — Progress Notes (Addendum)
? ?Subjective:  ? Lance Oneal is a 71 y.o. male who presents for an Initial Medicare Annual Wellness Visit. ? ?Review of Systems    ? ?Cardiac Risk Factors include: advanced age (>74men, >63 women);dyslipidemia;hypertension ? ?   ?Objective:  ?  ?Today's Vitals  ? 10/26/21 0837  ?BP: 128/80  ?Pulse: 65  ?Resp: 16  ?Temp: 97.9 ?F (36.6 ?C)  ?SpO2: 99%  ?Weight: 123 lb (55.8 kg)  ?Height: 5' (1.524 m)  ? ?Body mass index is 24.02 kg/m?. ? ? ?  10/26/2021  ?  8:42 AM 07/30/2021  ? 11:44 AM  ?Advanced Directives  ?Does Patient Have a Medical Advance Directive? No No  ?Would patient like information on creating a medical advance directive? No - Patient declined   ? ? ?Current Medications (verified) ?Outpatient Encounter Medications as of 10/26/2021  ?Medication Sig  ? atorvastatin (LIPITOR) 40 MG tablet TAKE 1 TABLET BY MOUTH EVERY DAY  ? finasteride (PROSCAR) 5 MG tablet Take 1 tablet (5 mg total) by mouth daily. (Patient taking differently: Take 5 mg by mouth at bedtime.)  ? fluticasone (FLONASE) 50 MCG/ACT nasal spray SPRAY 2 SPRAYS INTO EACH NOSTRIL EVERY DAY  ? hydrochlorothiazide (HYDRODIURIL) 25 MG tablet Take 1 tablet (25 mg total) by mouth daily. (Patient taking differently: Take 25 mg by mouth at bedtime.)  ? losartan (COZAAR) 25 MG tablet TAKE 2 TABLETS BY MOUTH EVERY DAY  ? oxyCODONE-acetaminophen (PERCOCET) 5-325 MG tablet Take 1 tablet by mouth every 6 (six) hours as needed for severe pain or moderate pain (post-operatively).  ? pantoprazole (PROTONIX) 40 MG tablet Take 1 tablet (40 mg total) by mouth daily. (Patient taking differently: Take 40 mg by mouth at bedtime.)  ? sulfamethoxazole-trimethoprim (BACTRIM DS) 800-160 MG tablet Take 1 tablet by mouth 2 (two) times daily. To prevent infection with catheter and drain in place  ? ?No facility-administered encounter medications on file as of 10/26/2021.  ? ? ?Allergies (verified) ?Lisinopril  ? ?History: ?Past Medical History:  ?Diagnosis Date  ? BPH (benign  prostatic hyperplasia)   ? GERD (gastroesophageal reflux disease)   ? History of gastric ulcer   ? per pt 1970s or 1980s s/p  open surgery for stomach ulcer  ? Hyperlipidemia   ? Hypertension   ? Right hydrocele   ? Urinary urgency   ? refractory  ? ?Past Surgical History:  ?Procedure Laterality Date  ? ABDOMINAL SURGERY    ? per pt done approxl 1970s or 1980s  for stomach ulcer (has large midline incision)  ? HYDROCELE EXCISION Right 07/30/2021  ? Procedure: HYDROCELECTOMY ADULT AND RIGHT ORCHIOPEXY;  Surgeon: Sebastian Ache, MD;  Location: [facility];  Service: Urology;  Laterality: Right;  ? TRANSURETHRAL RESECTION OF PROSTATE N/A 07/30/2021  ? Procedure: TRANSURETHRAL RESECTION OF THE PROSTATE (TURP);  Surgeon: Sebastian Ache, MD;  Location: [facility];  Service: Urology;  Laterality: N/A;  ? ?Family History  ?Problem Relation Age of Onset  ? Hypertension Mother   ? Liver cancer Father   ? Liver cancer Other   ? Colon cancer Neg Hx   ? Pancreatic cancer Neg Hx   ? Stomach cancer Neg Hx   ? ?Social History  ? ?Socioeconomic History  ? Marital status: Married  ?  Spouse name: Not on file  ? Number of children: Not on file  ? Years of education: Not on file  ? Highest education level: Not on file  ?Occupational History  ? Not on  file  ?Tobacco Use  ? Smoking status: Former  ?  Packs/day: 0.50  ?  Types: Cigarettes  ?  Quit date: 02/26/2005  ?  Years since quitting: 16.6  ? Smokeless tobacco: Never  ?Substance and Sexual Activity  ? Alcohol use: Yes  ? Drug use: Never  ? Sexual activity: Yes  ?  Partners: Female  ?Other Topics Concern  ? Not on file  ?Social History Narrative  ? Not on file  ? ?Social Determinants of Health  ? ?Financial Resource Strain: Low Risk   ? Difficulty of Paying Living Expenses: Not hard at all  ?Food Insecurity: No Food Insecurity  ? Worried About Programme researcher, broadcasting/film/video in the Last Year: Never true  ? Ran Out of Food in the Last Year: Never true  ?Transportation  Needs: No Transportation Needs  ? Lack of Transportation (Medical): No  ? Lack of Transportation (Non-Medical): No  ?Physical Activity: Not on file  ?Stress: No Stress Concern Present  ? Feeling of Stress : Not at all  ?Social Connections: Moderately Integrated  ? Frequency of Communication with Friends and Family: More than three times a week  ? Frequency of Social Gatherings with Friends and Family: More than three times a week  ? Attends Religious Services: Never  ? Active Member of Clubs or Organizations: Yes  ? Attends Banker Meetings: Never  ? Marital Status: Married  ? ? ?Tobacco Counseling ?Counseling given: Not Answered ? ? ?Clinical Intake: ? ?Pre-visit preparation completed: Yes ? ?Pain : No/denies pain ? ?  ? ?Nutritional Risks: None ? ?  ? ?Diabetic?No ? ?Interpreter Needed?: Yes ?Interpreter Name: [place] chung ?Patient Declined Interpreter : No ? ?Information entered by :: Keali Mccraw ? ? ?Activities of Daily Living ? ?  10/26/2021  ?  8:45 AM 07/30/2021  ? 11:52 AM  ?In your present state of health, do you have any difficulty performing the following activities:  ?Hearing? 0 0  ?Vision? 0 0  ?Difficulty concentrating or making decisions? 0 0  ?Walking or climbing stairs? 0 0  ?Dressing or bathing? 0 0  ?Doing errands, shopping? 0   ?Preparing Food and eating ? N   ?Using the Toilet? N   ?In the past six months, have you accidently leaked urine? Y   ?Do you have problems with loss of bowel control? Y   ?Managing your Medications? N   ?Managing your Finances? N   ?Housekeeping or managing your Housekeeping? N   ? ? ?Patient Care Team: ?Zola Button, Grayling Congress, DO as PCP - General (Family Medicine) ?Sinda Du, MD as Consulting Physician (Ophthalmology) ?Sebastian Ache, MD as Consulting Physician (Urology) ? ?Indicate any recent Medical Services you may have received from other than [facility] providers in the past year (date may be approximate). ? ?   ?Assessment:  ? This is a routine wellness  examination for Lance Oneal. ? ?Hearing/Vision screen ?No results found. ? ?Dietary issues and exercise activities discussed: ?Current Exercise Habits: Home exercise routine, Type of exercise: walking;stretching, Time (Minutes): 60, Frequency (Times/Week): 7, Weekly Exercise (Minutes/Week): 420, Intensity: Mild, Exercise limited by: None identified ? ? Goals Addressed   ?None ?  ? ?Depression Screen ? ?  10/26/2021  ?  8:45 AM 10/26/2021  ?  8:42 AM 11/30/2020  ?  2:36 PM 02/26/2013  ?  8:29 AM  ?PHQ 2/9 Scores  ?PHQ - 2 Score 0 0 0 0  ?  ?Fall Risk ? ?  10/26/2021  ?  8:42 AM 06/07/2021  ?  9:58 AM  ?Fall Risk   ?Falls in the past year? 0 0  ?Number falls in past yr: 0 0  ?Injury with Fall? 0 0  ?Risk for fall due to : No Fall Risks   ?Follow up Falls evaluation completed Falls evaluation completed  ? ? ?FALL RISK PREVENTION PERTAINING TO THE HOME: ? ?Any stairs in or around the home? Yes  ?If so, are there any without handrails? No  ?Home free of loose throw rugs in walkways, pet beds, electrical cords, etc? No  ?Adequate lighting in your home to reduce risk of falls? Yes  ? ?ASSISTIVE DEVICES UTILIZED TO PREVENT FALLS: ? ?Life alert? No  ?Use of a cane, walker or w/c? No  ?Grab bars in the bathroom? Yes  ?Shower chair or bench in shower? Yes  ?Elevated toilet seat or a handicapped toilet? No  ? ?TIMED UP AND GO: ? ?Was the test performed? Yes .  ?Length of time to ambulate 10 feet: 9 sec.  ? ?Gait steady and fast without use of assistive device ? ?Cognitive Function: ?  ?  ? ?  10/26/2021  ?  8:48 AM  ?6CIT Screen  ?What Year? 0 points  ?What month? 0 points  ?What time? 0 points  ?Count back from 20 0 points  ?Months in reverse 0 points  ?Repeat phrase 0 points  ?Total Score 0 points  ? ? ?Immunizations ?Immunization History  ?Administered Date(s) Administered  ? Influenza, High Dose Seasonal PF 03/07/2018, 02/25/2019, 04/06/2020, 04/05/2021  ? Influenza,inj,Quad PF,6+ Mos 04/07/2013, 03/05/2014, 03/03/2016  ?  Influenza-Unspecified 05/01/2017  ? Moderna Covid-19 Vaccine Bivalent Booster 3431yrs & up 06/07/2021  ? Moderna Sars-Covid-2 Vaccination 09/18/2019, 10/16/2019, 06/01/2020  ? Pneumococcal Conjugate-13 09/06/2016  ? Pneum

## 2021-12-13 ENCOUNTER — Ambulatory Visit: Payer: Medicare HMO | Admitting: Family Medicine

## 2022-01-10 ENCOUNTER — Other Ambulatory Visit: Payer: Self-pay | Admitting: Family Medicine

## 2022-01-10 DIAGNOSIS — I10 Essential (primary) hypertension: Secondary | ICD-10-CM

## 2022-01-13 ENCOUNTER — Encounter: Payer: Self-pay | Admitting: Family Medicine

## 2022-01-13 ENCOUNTER — Ambulatory Visit (INDEPENDENT_AMBULATORY_CARE_PROVIDER_SITE_OTHER): Payer: Medicare HMO | Admitting: Family Medicine

## 2022-01-13 VITALS — BP 112/80 | HR 52 | Temp 98.5°F | Resp 18 | Ht 60.0 in | Wt 124.8 lb

## 2022-01-13 DIAGNOSIS — Z1211 Encounter for screening for malignant neoplasm of colon: Secondary | ICD-10-CM

## 2022-01-13 DIAGNOSIS — E785 Hyperlipidemia, unspecified: Secondary | ICD-10-CM

## 2022-01-13 DIAGNOSIS — Z87438 Personal history of other diseases of male genital organs: Secondary | ICD-10-CM | POA: Diagnosis not present

## 2022-01-13 DIAGNOSIS — M1712 Unilateral primary osteoarthritis, left knee: Secondary | ICD-10-CM

## 2022-01-13 DIAGNOSIS — I700 Atherosclerosis of aorta: Secondary | ICD-10-CM

## 2022-01-13 DIAGNOSIS — I10 Essential (primary) hypertension: Secondary | ICD-10-CM | POA: Diagnosis not present

## 2022-01-13 DIAGNOSIS — R1013 Epigastric pain: Secondary | ICD-10-CM

## 2022-01-13 LAB — COMPREHENSIVE METABOLIC PANEL
ALT: 20 U/L (ref 0–53)
AST: 21 U/L (ref 0–37)
Albumin: 4.5 g/dL (ref 3.5–5.2)
Alkaline Phosphatase: 56 U/L (ref 39–117)
BUN: 21 mg/dL (ref 6–23)
CO2: 31 mEq/L (ref 19–32)
Calcium: 9.6 mg/dL (ref 8.4–10.5)
Chloride: 102 mEq/L (ref 96–112)
Creatinine, Ser: 1.04 mg/dL (ref 0.40–1.50)
GFR: 72.65 mL/min (ref >60.00)
Glucose, Bld: 76 mg/dL (ref 70–99)
Potassium: 3.9 mEq/L (ref 3.5–5.1)
Sodium: 140 mEq/L (ref 135–145)
Total Bilirubin: 1 mg/dL (ref 0.2–1.2)
Total Protein: 7.2 g/dL (ref 6.0–8.3)

## 2022-01-13 LAB — CBC WITH DIFFERENTIAL/PLATELET
Basophils Absolute: 0 10*3/uL (ref 0.0–0.1)
Basophils Relative: 0.4 % (ref 0.0–3.0)
Eosinophils Absolute: 0.2 10*3/uL (ref 0.0–0.7)
Eosinophils Relative: 2.5 % (ref 0.0–5.0)
HCT: 43.1 % (ref 39.0–52.0)
Hemoglobin: 14.4 g/dL (ref 13.0–17.0)
Lymphocytes Relative: 28.1 % (ref 12.0–46.0)
Lymphs Abs: 1.9 10*3/uL (ref 0.7–4.0)
MCHC: 33.4 g/dL (ref 30.0–36.0)
MCV: 90 fl (ref 78.0–100.0)
Monocytes Absolute: 0.5 10*3/uL (ref 0.1–1.0)
Monocytes Relative: 7.8 % (ref 3.0–12.0)
Neutro Abs: 4.2 10*3/uL (ref 1.4–7.7)
Neutrophils Relative %: 61.2 % (ref 43.0–77.0)
Platelets: 297 10*3/uL (ref 150.0–400.0)
RBC: 4.79 Mil/uL (ref 4.22–5.81)
RDW: 13.4 % (ref 11.5–15.5)
WBC: 6.9 10*3/uL (ref 4.0–10.5)

## 2022-01-13 LAB — LDL CHOLESTEROL, DIRECT: Direct LDL: 97 mg/dL

## 2022-01-13 LAB — LIPID PANEL
Cholesterol: 163 mg/dL (ref 0–200)
HDL: 40.1 mg/dL (ref >39.00)
NonHDL: 123.08
Total CHOL/HDL Ratio: 4
Triglycerides: 204 mg/dL — ABNORMAL HIGH (ref 0.0–149.0)
VLDL: 40.8 mg/dL — ABNORMAL HIGH (ref 0.0–40.0)

## 2022-01-13 MED ORDER — PANTOPRAZOLE SODIUM 40 MG PO TBEC: 40.0000 mg | DELAYED_RELEASE_TABLET | Freq: Every day | ORAL | 3 refills | Status: DC

## 2022-01-13 MED ORDER — HYDROCHLOROTHIAZIDE 25 MG PO TABS: 25.0000 mg | ORAL_TABLET | Freq: Every day | ORAL | 1 refills | Status: DC

## 2022-01-13 MED ORDER — FINASTERIDE 5 MG PO TABS: 5.0000 mg | ORAL_TABLET | Freq: Every day | ORAL | 1 refills | Status: DC

## 2022-01-13 MED ORDER — ATORVASTATIN CALCIUM 40 MG PO TABS: 40.0000 mg | ORAL_TABLET | Freq: Every day | ORAL | 1 refills | Status: DC

## 2022-01-13 MED ORDER — DICLOFENAC SODIUM 1 % EX GEL: 4.0000 g | Freq: Four times a day (QID) | CUTANEOUS | 2 refills | Status: DC

## 2022-01-13 MED ORDER — LOSARTAN POTASSIUM 25 MG PO TABS: 50.0000 mg | ORAL_TABLET | Freq: Every day | ORAL | 0 refills | Status: DC

## 2022-01-13 NOTE — Assessment & Plan Note (Signed)
-   cont statin

## 2022-01-13 NOTE — Patient Instructions (Signed)
Preventive Care 65 Years and Older, Male Preventive care refers to lifestyle choices and visits with your health care provider that can promote health and wellness. Preventive care visits are also called wellness exams. What can I expect for my preventive care visit? Counseling During your preventive care visit, your health care provider may ask about your: Medical history, including: Past medical problems. Family medical history. History of falls. Current health, including: Emotional well-being. Home life and relationship well-being. Sexual activity. Memory and ability to understand (cognition). Lifestyle, including: Alcohol, nicotine or tobacco, and drug use. Access to firearms. Diet, exercise, and sleep habits. Work and work environment. Sunscreen use. Safety issues such as seatbelt and bike helmet use. Physical exam Your health care provider will check your: Height and weight. These may be used to calculate your BMI (body mass index). BMI is a measurement that tells if you are at a healthy weight. Waist circumference. This measures the distance around your waistline. This measurement also tells if you are at a healthy weight and may help predict your risk of certain diseases, such as type 2 diabetes and high blood pressure. Heart rate and blood pressure. Body temperature. Skin for abnormal spots. What immunizations do I need?  Vaccines are usually given at various ages, according to a schedule. Your health care provider will recommend vaccines for you based on your age, medical history, and lifestyle or other factors, such as travel or where you work. What tests do I need? Screening Your health care provider may recommend screening tests for certain conditions. This may include: Lipid and cholesterol levels. Diabetes screening. This is done by checking your blood sugar (glucose) after you have not eaten for a while (fasting). Hepatitis C test. Hepatitis B test. HIV (human  immunodeficiency virus) test. STI (sexually transmitted infection) testing, if you are at risk. Lung cancer screening. Colorectal cancer screening. Prostate cancer screening. Abdominal aortic aneurysm (AAA) screening. You may need this if you are a current or former smoker. Talk with your health care provider about your test results, treatment options, and if necessary, the need for more tests. Follow these instructions at home: Eating and drinking  Eat a diet that includes fresh fruits and vegetables, whole grains, lean protein, and low-fat dairy products. Limit your intake of foods with high amounts of sugar, saturated fats, and salt. Take vitamin and mineral supplements as recommended by your health care provider. Do not drink alcohol if your health care provider tells you not to drink. If you drink alcohol: Limit how much you have to 0-2 drinks a day. Know how much alcohol is in your drink. In the [place] one drink equals one 12 oz bottle of beer (355 mL), one 5 oz glass of wine (148 mL), or one 1 oz glass of hard liquor (44 mL). Lifestyle Brush your teeth every morning and night with fluoride toothpaste. Floss one time each day. Exercise for at least 30 minutes 5 or more days each week. Do not use any products that contain nicotine or tobacco. These products include cigarettes, chewing tobacco, and vaping devices, such as e-cigarettes. If you need help quitting, ask your health care provider. Do not use drugs. If you are sexually active, practice safe sex. Use a condom or other form of protection to prevent STIs. Take aspirin only as told by your health care provider. Make sure that you understand how much to take and what form to take. Work with your health care provider to find out whether it is safe   and beneficial for you to take aspirin daily. Ask your health care provider if you need to take a cholesterol-lowering medicine (statin). Find healthy ways to manage stress, such  as: Meditation, yoga, or listening to music. Journaling. Talking to a trusted person. Spending time with friends and family. Safety Always wear your seat belt while driving or riding in a vehicle. Do not drive: If you have been drinking alcohol. Do not ride with someone who has been drinking. When you are tired or distracted. While texting. If you have been using any mind-altering substances or drugs. Wear a helmet and other protective equipment during sports activities. If you have firearms in your house, make sure you follow all gun safety procedures. Minimize exposure to UV radiation to reduce your risk of skin cancer. What's next? Visit your health care provider once a year for an annual wellness visit. Ask your health care provider how often you should have your eyes and teeth checked. Stay up to date on all vaccines. This information is not intended to replace advice given to you by your health care provider. Make sure you discuss any questions you have with your health care provider. Document Revised: 12/23/2020 Document Reviewed: 12/23/2020 Elsevier Patient Education  2023 Elsevier Inc.  

## 2022-01-13 NOTE — Progress Notes (Signed)
Subjective:   By signing my name below, I, Vickey Sages, attest that this documentation has been prepared under the direction and in the presence of Donato Schultz, DO  01/13/2022    Patient ID: Lance Oneal, male    DOB: May 11, 1951, 71 y.o.   MRN: [id number]  Chief Complaint  Patient presents with   Hypertension   Hyperlipidemia   Follow-up    Hypertension Pertinent negatives include no blurred vision, chest pain, headaches, malaise/fatigue, palpitations or shortness of breath.  Hyperlipidemia Pertinent negatives include no chest pain or shortness of breath.   Patient is in today for an office visit. He is requesting refills on all his medications: Lipitor 40 mg, Proscar 5 mg, Hydrodiuril 25 mg, Cozaar 25 mg and Protonix 40 mg. He reports left knee pain after returning home from a trip to [place] where he climbed mountains. He is requesting a topical cream to apply to his knee to manage the pain. He denies any other pain apart from the knee. He has recently seen a urologist to examine his prostrate.   Past Medical History:  Diagnosis Date   BPH (benign prostatic hyperplasia)    GERD (gastroesophageal reflux disease)    History of gastric ulcer    per pt 1970s or 1980s s/p  open surgery for stomach ulcer   Hyperlipidemia    Hypertension    Right hydrocele    Urinary urgency    refractory    Past Surgical History:  Procedure Laterality Date   ABDOMINAL SURGERY     per pt done approxl 1970s or 1980s  for stomach ulcer (has large midline incision)   HYDROCELE EXCISION Right 07/30/2021   Procedure: HYDROCELECTOMY ADULT AND RIGHT ORCHIOPEXY;  Surgeon: Sebastian Ache, MD;  Location: [facility];  Service: Urology;  Laterality: Right;   TRANSURETHRAL RESECTION OF PROSTATE N/A 07/30/2021   Procedure: TRANSURETHRAL RESECTION OF THE PROSTATE (TURP);  Surgeon: Sebastian Ache, MD;  Location: [facility];  Service: Urology;  Laterality: N/A;     Family History  Problem Relation Age of Onset   Hypertension Mother    Liver cancer Father    Liver cancer Other    Colon cancer Neg Hx    Pancreatic cancer Neg Hx    Stomach cancer Neg Hx     Social History   Socioeconomic History   Marital status: Married    Spouse name: Not on file   Number of children: Not on file   Years of education: Not on file   Highest education level: Not on file  Occupational History   Not on file  Tobacco Use   Smoking status: Former    Packs/day: 0.50    Types: Cigarettes    Quit date: 02/26/2005    Years since quitting: 16.8   Smokeless tobacco: Never  Substance and Sexual Activity   Alcohol use: Yes   Drug use: Never   Sexual activity: Yes    Partners: Female  Other Topics Concern   Not on file  Social History Narrative   Not on file   Social Determinants of Health   Financial Resource Strain: Low Risk  (10/26/2021)   Overall Financial Resource Strain (CARDIA)    Difficulty of Paying Living Expenses: Not hard at all  Food Insecurity: No Food Insecurity (10/26/2021)   Hunger Vital Sign    Worried About Running Out of Food in the Last Year: Never true    Ran Out of Food in the  Last Year: Never true  Transportation Needs: No Transportation Needs (10/26/2021)   PRAPARE - Administrator, Civil Service (Medical): No    Lack of Transportation (Non-Medical): No  Physical Activity: Not on file  Stress: No Stress Concern Present (10/26/2021)   [employer] of Occupational Health - Occupational Stress Questionnaire    Feeling of Stress : Not at all  Social Connections: Moderately Integrated (10/26/2021)   Social Connection and Isolation Panel [NHANES]    Frequency of Communication with Friends and Family: More than three times a week    Frequency of Social Gatherings with Friends and Family: More than three times a week    Attends Religious Services: Never    Database administrator or Organizations: Yes    Attends Occupational hygienist Meetings: Never    Marital Status: Married  Catering manager Violence: Not At Risk (10/26/2021)   Humiliation, Afraid, Rape, and Kick questionnaire    Fear of Current or Ex-Partner: No    Emotionally Abused: No    Physically Abused: No    Sexually Abused: No    Outpatient Medications Prior to Visit  Medication Sig Dispense Refill   fluticasone (FLONASE) 50 MCG/ACT nasal spray SPRAY 2 SPRAYS INTO EACH NOSTRIL EVERY DAY 48 mL 2   oxyCODONE-acetaminophen (PERCOCET) 5-325 MG tablet Take 1 tablet by mouth every 6 (six) hours as needed for severe pain or moderate pain (post-operatively). 15 tablet 0   atorvastatin (LIPITOR) 40 MG tablet TAKE 1 TABLET BY MOUTH EVERY DAY 90 tablet 1   finasteride (PROSCAR) 5 MG tablet Take 1 tablet (5 mg total) by mouth daily. (Patient taking differently: Take 5 mg by mouth at bedtime.) 90 tablet 1   hydrochlorothiazide (HYDRODIURIL) 25 MG tablet Take 1 tablet (25 mg total) by mouth daily. (Patient taking differently: Take 25 mg by mouth at bedtime.) 90 tablet 1   losartan (COZAAR) 25 MG tablet TAKE 2 TABLETS BY MOUTH EVERY DAY 180 tablet 0   pantoprazole (PROTONIX) 40 MG tablet Take 1 tablet (40 mg total) by mouth daily. (Patient taking differently: Take 40 mg by mouth at bedtime.) 90 tablet 3   sulfamethoxazole-trimethoprim (BACTRIM DS) 800-160 MG tablet Take 1 tablet by mouth 2 (two) times daily. To prevent infection with catheter and drain in place 6 tablet 0   No facility-administered medications prior to visit.    Allergies  Allergen Reactions   Lisinopril     Cough     Review of Systems  Constitutional:  Negative for fever and malaise/fatigue.  HENT:  Negative for congestion.   Eyes:  Negative for blurred vision.  Respiratory:  Negative for cough and shortness of breath.   Cardiovascular:  Negative for chest pain, palpitations and leg swelling.  Gastrointestinal:  Negative for vomiting.  Musculoskeletal:  Negative for back pain.        (+) left knee pain  Skin:  Negative for rash.  Neurological:  Negative for loss of consciousness and headaches.       Objective:    Physical Exam Constitutional:      Appearance: Normal appearance.  HENT:     Head: Normocephalic and atraumatic.     Right Ear: Ear canal and external ear normal.     Left Ear: Ear canal and external ear normal.  Eyes:     Extraocular Movements: Extraocular movements intact.     Pupils: Pupils are equal, round, and reactive to light.  Cardiovascular:     Rate and  Rhythm: Normal rate and regular rhythm.     Pulses: Normal pulses.     Heart sounds: Normal heart sounds. No murmur heard.    No gallop.  Pulmonary:     Effort: Pulmonary effort is normal. No respiratory distress.     Breath sounds: Normal breath sounds. No wheezing.  Skin:    General: Skin is warm and dry.  Neurological:     Mental Status: He is alert and oriented to person, place, and time.  Psychiatric:        Judgment: Judgment normal.     BP 112/80 (BP Location: Left Arm, Patient Position: Sitting, Cuff Size: Normal)   Pulse (!) 52   Temp 98.5 F (36.9 C) (Oral)   Resp 18   Ht 5' (1.524 m)   Wt 124 lb 12.8 oz (56.6 kg)   SpO2 98%   BMI 24.37 kg/m  Wt Readings from Last 3 Encounters:  01/13/22 124 lb 12.8 oz (56.6 kg)  10/26/21 123 lb (55.8 kg)  07/30/21 117 lb 1.6 oz (53.1 kg)    Diabetic Foot Exam - Simple   No data filed    Lab Results  Component Value Date   WBC 6.9 01/13/2022   HGB 14.4 01/13/2022   HCT 43.1 01/13/2022   PLT 297.0 01/13/2022   GLUCOSE 76 01/13/2022   CHOL 163 01/13/2022   TRIG 204.0 (H) 01/13/2022   HDL 40.10 01/13/2022   LDLDIRECT 97.0 01/13/2022   LDLCALC 77 06/07/2021   ALT 20 01/13/2022   AST 21 01/13/2022   NA 140 01/13/2022   K 3.9 01/13/2022   CL 102 01/13/2022   CREATININE 1.04 01/13/2022   BUN 21 01/13/2022   CO2 31 01/13/2022   TSH 0.59 09/06/2016   PSA 0.44 06/07/2021    Lab Results  Component Value Date    TSH 0.59 09/06/2016   Lab Results  Component Value Date   WBC 6.9 01/13/2022   HGB 14.4 01/13/2022   HCT 43.1 01/13/2022   MCV 90.0 01/13/2022   PLT 297.0 01/13/2022   Lab Results  Component Value Date   NA 140 01/13/2022   K 3.9 01/13/2022   CO2 31 01/13/2022   GLUCOSE 76 01/13/2022   BUN 21 01/13/2022   CREATININE 1.04 01/13/2022   BILITOT 1.0 01/13/2022   ALKPHOS 56 01/13/2022   AST 21 01/13/2022   ALT 20 01/13/2022   PROT 7.2 01/13/2022   ALBUMIN 4.5 01/13/2022   CALCIUM 9.6 01/13/2022   GFR 72.65 01/13/2022   Lab Results  Component Value Date   CHOL 163 01/13/2022   Lab Results  Component Value Date   HDL 40.10 01/13/2022   Lab Results  Component Value Date   LDLCALC 77 06/07/2021   Lab Results  Component Value Date   TRIG 204.0 (H) 01/13/2022   Lab Results  Component Value Date   CHOLHDL 4 01/13/2022   No results found for: "HGBA1C"     Assessment & Plan:   Problem List Items Addressed This Visit       Unprioritized   Dyspepsia   Relevant Medications   pantoprazole (PROTONIX) 40 MG tablet   Hyperlipidemia    Encourage heart healthy diet such as MIND or DASH diet, increase exercise, avoid trans fats, simple carbohydrates and processed foods, consider a krill or fish or flaxseed oil cap daily.       Relevant Medications   atorvastatin (LIPITOR) 40 MG tablet   hydrochlorothiazide (HYDRODIURIL) 25 MG tablet  losartan (COZAAR) 25 MG tablet   Other Relevant Orders   CBC with Differential/Platelet (Completed)   Comprehensive metabolic panel (Completed)   Lipid panel (Completed)   History of BPH    Per urology      Relevant Medications   finasteride (PROSCAR) 5 MG tablet   Essential hypertension    Well controlled, no changes to meds. Encouraged heart healthy diet such as the DASH diet and exercise as tolerated.       Relevant Medications   atorvastatin (LIPITOR) 40 MG tablet   hydrochlorothiazide (HYDRODIURIL) 25 MG tablet   losartan  (COZAAR) 25 MG tablet   Other Relevant Orders   CBC with Differential/Platelet (Completed)   Comprehensive metabolic panel (Completed)   Lipid panel (Completed)   Atherosclerosis of aorta (HCC)    con't statin      Relevant Medications   atorvastatin (LIPITOR) 40 MG tablet   hydrochlorothiazide (HYDRODIURIL) 25 MG tablet   losartan (COZAAR) 25 MG tablet   Other Visit Diagnoses     Colon cancer screening    -  Primary   Relevant Orders   Ambulatory referral to Gastroenterology   Primary osteoarthritis of left knee       Relevant Medications   diclofenac Sodium (VOLTAREN) 1 % GEL        Meds ordered this encounter  Medications   atorvastatin (LIPITOR) 40 MG tablet    Sig: Take 1 tablet (40 mg total) by mouth daily.    Dispense:  90 tablet    Refill:  1   finasteride (PROSCAR) 5 MG tablet    Sig: Take 1 tablet (5 mg total) by mouth daily.    Dispense:  90 tablet    Refill:  1   hydrochlorothiazide (HYDRODIURIL) 25 MG tablet    Sig: Take 1 tablet (25 mg total) by mouth daily.    Dispense:  90 tablet    Refill:  1   losartan (COZAAR) 25 MG tablet    Sig: Take 2 tablets (50 mg total) by mouth daily.    Dispense:  180 tablet    Refill:  0   pantoprazole (PROTONIX) 40 MG tablet    Sig: Take 1 tablet (40 mg total) by mouth daily.    Dispense:  90 tablet    Refill:  3   diclofenac Sodium (VOLTAREN) 1 % GEL    Sig: Apply 4 g topically 4 (four) times daily.    Dispense:  150 g    Refill:  2    I, Donato Schultz, DO, personally preformed the services described in this documentation.  All medical record entries made by the scribe were at my direction and in my presence.  I have reviewed the chart and discharge instructions (if applicable) and agree that the record reflects my personal performance and is accurate and complete. 01/13/2022   I,Mohammed Iqbal,acting as a scribe for Donato Schultz, DO.,have documented all relevant documentation on the behalf of  Donato Schultz, DO,as directed by  Donato Schultz, DO while in the presence of Donato Schultz, DO.    Donato Schultz, DO

## 2022-01-13 NOTE — Assessment & Plan Note (Signed)
Per u rology 

## 2022-01-13 NOTE — Assessment & Plan Note (Signed)
Well controlled, no changes to meds. Encouraged heart healthy diet such as the DASH diet and exercise as tolerated.  °

## 2022-01-13 NOTE — Assessment & Plan Note (Signed)
Encourage heart healthy diet such as MIND or DASH diet, increase exercise, avoid trans fats, simple carbohydrates and processed foods, consider a krill or fish or flaxseed oil cap daily.  °

## 2022-03-29 DIAGNOSIS — H43811 Vitreous degeneration, right eye: Secondary | ICD-10-CM | POA: Diagnosis not present

## 2022-03-29 DIAGNOSIS — H2513 Age-related nuclear cataract, bilateral: Secondary | ICD-10-CM | POA: Diagnosis not present

## 2022-03-29 DIAGNOSIS — H353132 Nonexudative age-related macular degeneration, bilateral, intermediate dry stage: Secondary | ICD-10-CM | POA: Diagnosis not present

## 2022-03-30 ENCOUNTER — Encounter: Payer: Self-pay | Admitting: Family Medicine

## 2022-04-05 ENCOUNTER — Encounter: Payer: Self-pay | Admitting: Gastroenterology

## 2022-04-05 DIAGNOSIS — R350 Frequency of micturition: Secondary | ICD-10-CM | POA: Diagnosis not present

## 2022-04-07 ENCOUNTER — Ambulatory Visit (INDEPENDENT_AMBULATORY_CARE_PROVIDER_SITE_OTHER): Payer: Medicare Other | Admitting: Family Medicine

## 2022-04-07 ENCOUNTER — Encounter: Payer: Self-pay | Admitting: Family Medicine

## 2022-04-07 ENCOUNTER — Ambulatory Visit (HOSPITAL_BASED_OUTPATIENT_CLINIC_OR_DEPARTMENT_OTHER)
Admission: RE | Admit: 2022-04-07 | Discharge: 2022-04-07 | Disposition: A | Discharge status: Home / Self Care | Payer: Medicare Other | Source: Ambulatory Visit | Attending: Family Medicine | Admitting: Family Medicine

## 2022-04-07 VITALS — BP 140/84 | HR 68 | Temp 98.6°F | Resp 16 | Ht 60.0 in | Wt 126.6 lb

## 2022-04-07 DIAGNOSIS — R059 Cough, unspecified: Secondary | ICD-10-CM | POA: Diagnosis not present

## 2022-04-07 DIAGNOSIS — J40 Bronchitis, not specified as acute or chronic: Secondary | ICD-10-CM | POA: Insufficient documentation

## 2022-04-07 MED ORDER — LORATADINE 10 MG PO TABS: 10.0000 mg | ORAL_TABLET | Freq: Every day | ORAL | 11 refills | Status: AC

## 2022-04-07 MED ORDER — AZITHROMYCIN 250 MG PO TABS: ORAL_TABLET | ORAL | 0 refills | Status: DC

## 2022-04-07 NOTE — Assessment & Plan Note (Signed)
z pack  con't with delsym or try mucinex  cxr today

## 2022-04-07 NOTE — Patient Instructions (Addendum)
Take Mucinex DM over the counter for cough    Acute Bronchitis, Adult  Acute bronchitis is sudden inflammation of the main airways (bronchi) that come off the windpipe (trachea) in the lungs. The swelling causes the airways to get smaller and make more mucus than normal. This can make it hard to breathe and can cause coughing or noisy breathing (wheezing). Acute bronchitis may last several weeks. The cough may last longer. Allergies, asthma, and exposure to smoke may make the condition worse. What are the causes? This condition can be caused by germs and by substances that irritate the lungs, including: Cold and flu viruses. The most common cause of this condition is the virus that causes the common cold. Bacteria. This is less common. Breathing in substances that irritate the lungs, including: Smoke from cigarettes and other forms of tobacco. Dust and pollen. Fumes from household cleaning products, gases, or burned fuel. Indoor or outdoor air pollution. What increases the risk? The following factors may make you more likely to develop this condition: A weak body's defense system, also called the immune system. A condition that affects your lungs and breathing, such as asthma. What are the signs or symptoms? Common symptoms of this condition include: Coughing. This may bring up clear, yellow, or green mucus from your lungs (sputum). Wheezing. Runny or stuffy nose. Having too much mucus in your lungs (chest congestion). Shortness of breath. Aches and pains, including sore throat or chest. How is this diagnosed? This condition is usually diagnosed based on: Your symptoms and medical history. A physical exam. You may also have other tests, including tests to rule out other conditions, such as pneumonia. These tests include: A test of lung function. Test of a mucus sample to look for the presence of bacteria. Tests to check the oxygen level in your blood. Blood tests. Chest  X-ray. How is this treated? Most cases of acute bronchitis clear up over time without treatment. Your health care provider may recommend: Drinking more fluids to help thin your mucus so it is easier to cough up. Taking inhaled medicine (inhaler) to improve air flow in and out of your lungs. Using a vaporizer or a humidifier. These are machines that add water to the air to help you breathe better. Taking a medicine that thins mucus and clears congestion (expectorant). Taking a medicine that prevents or stops coughing (cough suppressant). It is not common to take an antibiotic medicine for this condition. Follow these instructions at home:  Take over-the-counter and prescription medicines only as told by your health care provider. Use an inhaler, vaporizer, or humidifier as told by your health care provider. Take two teaspoons (10 mL) of honey at bedtime to lessen coughing at night. Drink enough fluid to keep your urine pale yellow. Do not use any products that contain nicotine or tobacco. These products include cigarettes, chewing tobacco, and vaping devices, such as e-cigarettes. If you need help quitting, ask your health care provider. Get plenty of rest. Return to your normal activities as told by your health care provider. Ask your health care provider what activities are safe for you. Keep all follow-up visits. This is important. How is this prevented? To lower your risk of getting this condition again: Wash your hands often with soap and water for at least 20 seconds. If soap and water are not available, use hand sanitizer. Avoid contact with people who have cold symptoms. Try not to touch your mouth, nose, or eyes with your hands. Avoid breathing in  smoke or chemical fumes. Breathing smoke or chemical fumes will make your condition worse. Get the flu shot every year. Contact a health care provider if: Your symptoms do not improve after 2 weeks. You have trouble coughing up the  mucus. Your cough keeps you awake at night. You have a fever. Get help right away if you: Cough up blood. Feel pain in your chest. Have severe shortness of breath. Faint or keep feeling like you are going to faint. Have a severe headache. Have a fever or chills that get worse. These symptoms may represent a serious problem that is an emergency. Do not wait to see if the symptoms will go away. Get medical help right away. Call your local emergency services (911 in the [place] Do not drive yourself to the hospital. Summary Acute bronchitis is inflammation of the main airways (bronchi) that come off the windpipe (trachea) in the lungs. The swelling causes the airways to get smaller and make more mucus than normal. Drinking more fluids can help thin your mucus so it is easier to cough up. Take over-the-counter and prescription medicines only as told by your health care provider. Do not use any products that contain nicotine or tobacco. These products include cigarettes, chewing tobacco, and vaping devices, such as e-cigarettes. If you need help quitting, ask your health care provider. Contact a health care provider if your symptoms do not improve after 2 weeks. This information is not intended to replace advice given to you by your health care provider. Make sure you discuss any questions you have with your health care provider. Document Revised: 10/07/2021 Document Reviewed: 10/28/2020 Elsevier Patient Education  2023 [employer].

## 2022-04-07 NOTE — Progress Notes (Signed)
Subjective:   By signing my name below, I, Lance Oneal, attest that this documentation has been prepared under the direction and in the presence of Lance Oneal 04/07/2022    Patient ID: Lance Oneal, male    DOB: [birth year], 71 y.o.   MRN: [id number]  Chief Complaint  Patient presents with   Cough    Productive cough for 2 weeks and states having to take a deep breathe. Cough worse at night. He states taking Delsym. Pt refused interpreter    HPI Patient is in today for an office visit-- c/o prod cough x 2 weeks and he has trouble taking a deep breath.  Cough is worse at night.  The delsym helps   no fever  He complains of a cough. Symptoms worsen during the evening. He is currently taking Delsym which helps alleviate some of his symptoms.he states that symptoms appeared when he was gardening and/or mowing without wearing a mask. During the first day of symptoms, he notes a fever and sore throat but since then symptoms are resolved. He also used to cough when forcibly inhaling a breath but symptoms are improving. He denies of congestion or a stuffy nose. He also denies of any recent travel.  Past Medical History:  Diagnosis Date   BPH (benign prostatic hyperplasia)    GERD (gastroesophageal reflux disease)    History of gastric ulcer    per pt 1970s or 1980s s/p  open surgery for stomach ulcer   Hyperlipidemia    Hypertension    Right hydrocele    Urinary urgency    refractory    Past Surgical History:  Procedure Laterality Date   ABDOMINAL SURGERY     per pt done approxl 1970s or 1980s  for stomach ulcer (has large midline incision)   HYDROCELE EXCISION Right 07/30/2021   Procedure: HYDROCELECTOMY ADULT AND RIGHT ORCHIOPEXY;  Surgeon: Sebastian Ache, MD;  Location: [facility];  Service: Urology;  Laterality: Right;   TRANSURETHRAL RESECTION OF PROSTATE N/A 07/30/2021   Procedure: TRANSURETHRAL RESECTION OF THE PROSTATE (TURP);  Surgeon: Sebastian Ache,  MD;  Location: [facility];  Service: Urology;  Laterality: N/A;    Family History  Problem Relation Age of Onset   Hypertension Mother    Liver cancer Father    Liver cancer Other    Colon cancer Neg Hx    Pancreatic cancer Neg Hx    Stomach cancer Neg Hx     Social History   Socioeconomic History   Marital status: Married    Spouse name: Not on file   Number of children: Not on file   Years of education: Not on file   Highest education level: Not on file  Occupational History   Not on file  Tobacco Use   Smoking status: Former    Packs/day: 0.50    Types: Cigarettes    Quit date: 02/26/2005    Years since quitting: 17.1   Smokeless tobacco: Never  Substance and Sexual Activity   Alcohol use: Yes   Drug use: Never   Sexual activity: Yes    Partners: Female  Other Topics Concern   Not on file  Social History Narrative   Not on file   Social Determinants of Health   Financial Resource Strain: Low Risk  (10/26/2021)   Overall Financial Resource Strain (CARDIA)    Difficulty of Paying Living Expenses: Not hard at all  Food Insecurity: No Food Insecurity (10/26/2021)  Hunger Vital Sign    Worried About Running Out of Food in the Last Year: Never true    Ran Out of Food in the Last Year: Never true  Transportation Needs: No Transportation Needs (10/26/2021)   PRAPARE - Hydrologist (Medical): No    Lack of Transportation (Non-Medical): No  Physical Activity: Not on file  Stress: No Stress Concern Present (10/26/2021)   [place]    Feeling of Stress : Not at all  Social Connections: Moderately Integrated (10/26/2021)   Social Connection and Isolation Panel [NHANES]    Frequency of Communication with Friends and Family: More than three times a week    Frequency of Social Gatherings with Friends and Family: More than three times a week    Attends Religious  Services: Never    Marine scientist or Organizations: Yes    Attends Archivist Meetings: Never    Marital Status: Married  Human resources officer Violence: Not At Risk (10/26/2021)   Humiliation, Afraid, Rape, and Kick questionnaire    Fear of Current or Ex-Partner: No    Emotionally Abused: No    Physically Abused: No    Sexually Abused: No    Outpatient Medications Prior to Visit  Medication Sig Dispense Refill   atorvastatin (LIPITOR) 40 MG tablet Take 1 tablet (40 mg total) by mouth daily. 90 tablet 1   diclofenac Sodium (VOLTAREN) 1 % GEL Apply 4 g topically 4 (four) times daily. 150 g 2   finasteride (PROSCAR) 5 MG tablet Take 1 tablet (5 mg total) by mouth daily. 90 tablet 1   fluticasone (FLONASE) 50 MCG/ACT nasal spray SPRAY 2 SPRAYS INTO EACH NOSTRIL EVERY DAY 48 mL 2   hydrochlorothiazide (HYDRODIURIL) 25 MG tablet Take 1 tablet (25 mg total) by mouth daily. 90 tablet 1   losartan (COZAAR) 25 MG tablet Take 2 tablets (50 mg total) by mouth daily. 180 tablet 0   oxyCODONE-acetaminophen (PERCOCET) 5-325 MG tablet Take 1 tablet by mouth every 6 (six) hours as needed for severe pain or moderate pain (post-operatively). 15 tablet 0   pantoprazole (PROTONIX) 40 MG tablet Take 1 tablet (40 mg total) by mouth daily. 90 tablet 3   No facility-administered medications prior to visit.    Allergies  Allergen Reactions   Lisinopril     Cough     Review of Systems  Constitutional:  Negative for fever and malaise/fatigue.  HENT:  Negative for congestion.        (-) Stuffy Nose  Eyes:  Negative for blurred vision.  Respiratory:  Positive for cough and sputum production. Negative for shortness of breath and wheezing.   Cardiovascular:  Negative for chest pain, palpitations and leg swelling.  Gastrointestinal:  Negative for abdominal pain, blood in stool and nausea.  Genitourinary:  Negative for dysuria and frequency.  Musculoskeletal:  Negative for falls.  Skin:   Negative for rash.  Neurological:  Negative for dizziness, loss of consciousness and headaches.  Endo/Heme/Allergies:  Negative for environmental allergies.  Psychiatric/Behavioral:  Negative for depression. The patient is not nervous/anxious.        Objective:    Physical Exam Vitals and nursing note reviewed.  Constitutional:      General: He is not in acute distress.    Appearance: Normal appearance. He is well-developed. He is not ill-appearing.  HENT:     Head: Normocephalic and atraumatic.  Right Ear: External ear normal.     Left Ear: External ear normal.     Mouth/Throat:     Pharynx: No oropharyngeal exudate or posterior oropharyngeal erythema.  Eyes:     Extraocular Movements: Extraocular movements intact.     Pupils: Pupils are equal, round, and reactive to light.  Neck:     Thyroid: No thyromegaly.  Cardiovascular:     Rate and Rhythm: Normal rate and regular rhythm.     Heart sounds: Normal heart sounds. No murmur heard.    No gallop.  Pulmonary:     Effort: Pulmonary effort is normal. No respiratory distress.     Breath sounds: Normal breath sounds. No wheezing or rales.  Chest:     Chest wall: No tenderness.  Musculoskeletal:     Cervical back: Normal range of motion and neck supple.     Right hip: Normal range of motion. Normal strength.     Left hip: Normal range of motion. Normal strength.     Right foot: No swelling or bony tenderness.     Left foot: No swelling.  Lymphadenopathy:     Cervical: No cervical adenopathy.  Skin:    General: Skin is warm and dry.  Neurological:     Mental Status: He is alert and oriented to person, place, and time.  Psychiatric:        Behavior: Behavior normal.        Thought Content: Thought content normal.        Judgment: Judgment normal.     BP (!) 140/84 (BP Location: Left Arm, Patient Position: Sitting, Cuff Size: Normal)   Pulse 68   Temp 98.6 F (37 C) (Oral)   Resp 16   Ht 5' (1.524 m)   Wt 126 lb  9.6 oz (57.4 kg)   SpO2 98%   BMI 24.72 kg/m  Wt Readings from Last 3 Encounters:  04/07/22 126 lb 9.6 oz (57.4 kg)  01/13/22 124 lb 12.8 oz (56.6 kg)  10/26/21 123 lb (55.8 kg)    Diabetic Foot Exam - Simple   No data filed    Lab Results  Component Value Date   WBC 6.9 01/13/2022   HGB 14.4 01/13/2022   HCT 43.1 01/13/2022   PLT 297.0 01/13/2022   GLUCOSE 76 01/13/2022   CHOL 163 01/13/2022   TRIG 204.0 (H) 01/13/2022   HDL 40.10 01/13/2022   LDLDIRECT 97.0 01/13/2022   LDLCALC 77 06/07/2021   ALT 20 01/13/2022   AST 21 01/13/2022   NA 140 01/13/2022   K 3.9 01/13/2022   CL 102 01/13/2022   CREATININE 1.04 01/13/2022   BUN 21 01/13/2022   CO2 31 01/13/2022   TSH 0.59 09/06/2016   PSA 0.44 06/07/2021    Lab Results  Component Value Date   TSH 0.59 09/06/2016   Lab Results  Component Value Date   WBC 6.9 01/13/2022   HGB 14.4 01/13/2022   HCT 43.1 01/13/2022   MCV 90.0 01/13/2022   PLT 297.0 01/13/2022   Lab Results  Component Value Date   NA 140 01/13/2022   K 3.9 01/13/2022   CO2 31 01/13/2022   GLUCOSE 76 01/13/2022   BUN 21 01/13/2022   CREATININE 1.04 01/13/2022   BILITOT 1.0 01/13/2022   ALKPHOS 56 01/13/2022   AST 21 01/13/2022   ALT 20 01/13/2022   PROT 7.2 01/13/2022   ALBUMIN 4.5 01/13/2022   CALCIUM 9.6 01/13/2022   GFR 72.65 01/13/2022  Lab Results  Component Value Date   CHOL 163 01/13/2022   Lab Results  Component Value Date   HDL 40.10 01/13/2022   Lab Results  Component Value Date   LDLCALC 77 06/07/2021   Lab Results  Component Value Date   TRIG 204.0 (H) 01/13/2022   Lab Results  Component Value Date   CHOLHDL 4 01/13/2022   No results found for: "HGBA1C"     Assessment & Plan:   Problem List Items Addressed This Visit       Unprioritized   Bronchitis - Primary    z pack  con't with delsym or try mucinex  cxr today       Relevant Medications   azithromycin (ZITHROMAX Z-PAK) 250 MG tablet    loratadine (CLARITIN) 10 MG tablet   Other Relevant Orders   DG Chest 2 View (Completed)   Meds ordered this encounter  Medications   azithromycin (ZITHROMAX Z-PAK) 250 MG tablet    Sig: As directed    Dispense:  6 each    Refill:  0   loratadine (CLARITIN) 10 MG tablet    Sig: Take 1 tablet (10 mg total) by mouth daily.    Dispense:  30 tablet    Refill:  11    I, [name], Oneal, personally preformed the services described in this documentation.  All medical record entries made by the scribe were at my direction and in my presence.  I have reviewed the chart and discharge instructions (if applicable) and agree that the record reflects my personal performance and is accurate and complete. 04/07/2022   I,Amber Collins,acting as a scribe for [name], Oneal.,have documented all relevant documentation on the behalf of [name], Oneal,as directed by  [name], Oneal while in the presence of [name], Oneal.    [name], Oneal

## 2022-04-11 ENCOUNTER — Ambulatory Visit
Admission: EM | Admit: 2022-04-11 | Discharge: 2022-04-11 | Disposition: A | Discharge status: Home / Self Care | Payer: Medicare Other | Attending: Family Medicine | Admitting: Family Medicine

## 2022-04-11 ENCOUNTER — Ambulatory Visit (INDEPENDENT_AMBULATORY_CARE_PROVIDER_SITE_OTHER): Payer: Medicare Other

## 2022-04-11 DIAGNOSIS — M25561 Pain in right knee: Secondary | ICD-10-CM

## 2022-04-11 MED ORDER — PREDNISONE 20 MG PO TABS: 40.0000 mg | ORAL_TABLET | Freq: Every day | ORAL | 0 refills | Status: AC

## 2022-04-11 MED ORDER — TRAMADOL HCL 50 MG PO TABS: 50.0000 mg | ORAL_TABLET | Freq: Four times a day (QID) | ORAL | 0 refills | Status: AC | PRN

## 2022-04-11 MED ORDER — KETOROLAC TROMETHAMINE 30 MG/ML IJ SOLN
30.0000 mg | Freq: Once | INTRAMUSCULAR | Status: AC
  Administered 2022-04-11: 30 mg via INTRAMUSCULAR

## 2022-04-11 NOTE — ED Provider Notes (Addendum)
[facility]    CSN: [id number] Arrival date & time: 04/11/22  1103      History   Chief Complaint Chief Complaint  Patient presents with   Knee Pain    HPI Lance Oneal is a 71 y.o. male.    Knee Pain  Here for pain in his right knee.  Yesterday he had [place] without any problem.  He did not have any fall or trauma.  He then took a nap and slept.  When he awoke he began having pain in his right knee.   No fever or chills  Never had this happen before.  Review of the chart his renal function is normal; there are no uric acid levels done in the past in epic  Past Medical History:  Diagnosis Date   BPH (benign prostatic hyperplasia)    GERD (gastroesophageal reflux disease)    History of gastric ulcer    per pt 1970s or 1980s s/p  open surgery for stomach ulcer   Hyperlipidemia    Hypertension    Right hydrocele    Urinary urgency    refractory    Patient Active Problem List   Diagnosis Date Noted   Bronchitis 04/07/2022   Prostatic hyperplasia 07/30/2021   Dyspepsia 04/09/2018   Seasonal allergic rhinitis due to pollen 04/09/2018   Benign prostatic hyperplasia (BPH) with urinary urgency 02/27/2017   Pharyngitis 09/26/2016   Cough 09/06/2016   Atherosclerosis of aorta ([place]) 09/06/2016   Preventative health care 08/30/2015   History of BPH 02/10/2015   Tooth pain 02/10/2015   Hyperlipidemia 06/07/2013   Urinary frequency 04/17/2013   Pain in left testicle 04/17/2013   Essential hypertension 01/01/2013    Past Surgical History:  Procedure Laterality Date   ABDOMINAL SURGERY     per pt done approxl 1970s or 1980s  for stomach ulcer (has large midline incision)   HYDROCELE EXCISION Right 07/30/2021   Procedure: HYDROCELECTOMY ADULT AND RIGHT ORCHIOPEXY;  Surgeon: Alexis Frock, MD;  Location: [facility];  Service: Urology;  Laterality: Right;   TRANSURETHRAL RESECTION OF PROSTATE N/A 07/30/2021   Procedure:  TRANSURETHRAL RESECTION OF THE PROSTATE (TURP);  Surgeon: Alexis Frock, MD;  Location: [facility];  Service: Urology;  Laterality: N/A;       Home Medications    Prior to Admission medications   Medication Sig Start Date End Date Taking? Authorizing Provider  predniSONE (DELTASONE) 20 MG tablet Take 2 tablets (40 mg total) by mouth daily with breakfast for 5 days. 04/11/22 04/16/22 Yes Danta Baumgardner, Gwenlyn Perking, MD  traMADol (ULTRAM) 50 MG tablet Take 1 tablet (50 mg total) by mouth every 6 (six) hours as needed (pain). 04/11/22  Yes Barrett Henle, MD  atorvastatin (LIPITOR) 40 MG tablet Take 1 tablet (40 mg total) by mouth daily. 01/13/22   [name], DO  azithromycin (ZITHROMAX Z-PAK) 250 MG tablet As directed 04/07/22   Carollee Herter, Alferd Apa, DO  diclofenac Sodium (VOLTAREN) 1 % GEL Apply 4 g topically 4 (four) times daily. 01/13/22   [name], DO  finasteride (PROSCAR) 5 MG tablet Take 1 tablet (5 mg total) by mouth daily. 01/13/22   Carollee Herter, Kendrick Fries R, DO  fluticasone (FLONASE) 50 MCG/ACT nasal spray SPRAY 2 SPRAYS INTO EACH NOSTRIL EVERY DAY 08/12/21   Carollee Herter, Alferd Apa, DO  hydrochlorothiazide (HYDRODIURIL) 25 MG tablet Take 1 tablet (25 mg total) by mouth daily. 01/13/22  Carollee Herter, Yvonne R, DO  loratadine (CLARITIN) 10 MG tablet Take 1 tablet (10 mg total) by mouth daily. 04/07/22   [name], DO  losartan (COZAAR) 25 MG tablet Take 2 tablets (50 mg total) by mouth daily. 01/13/22   [name], DO  pantoprazole (PROTONIX) 40 MG tablet Take 1 tablet (40 mg total) by mouth daily. 01/13/22   [name], DO    Family History Family History  Problem Relation Age of Onset   Hypertension Mother    Liver cancer Father    Liver cancer Other    Colon cancer Neg Hx    Pancreatic cancer Neg Hx    Stomach cancer Neg Hx     Social History Social History   Tobacco Use   Smoking status: Former    Packs/day: 0.50     Types: Cigarettes    Quit date: 02/26/2005    Years since quitting: 17.1   Smokeless tobacco: Never  Substance Use Topics   Alcohol use: Yes   Drug use: Never     Allergies   Lisinopril   Review of Systems Review of Systems   Physical Exam Triage Vital Signs ED Triage Vitals [04/11/22 1225]  Enc Vitals Group     BP 122/74     Pulse Rate 82     Resp 16     Temp 97.9 F (36.6 C)     Temp Source Oral     SpO2 95 %     Weight      Height      Head Circumference      Peak Flow      Pain Score      Pain Loc      Pain Edu?      Excl. in [place]?    No data found.  Updated Vital Signs BP 122/74 (BP Location: Right Arm)   Pulse 82   Temp 97.9 F (36.6 C) (Oral)   Resp 16   SpO2 95%   Visual Acuity Right Eye Distance:   Left Eye Distance:   Bilateral Distance:    Right Eye Near:   Left Eye Near:    Bilateral Near:     Physical Exam Vitals reviewed.  Constitutional:      General: He is not in acute distress.    Appearance: He is not ill-appearing, toxic-appearing or diaphoretic.  Musculoskeletal:     Comments: Initially he has a knee sleeve brace on.  We removed that and he is very tender on the upper pole of his knee joint.  If there is any effusion is small.  There is no warmth or erythema.  Not tender along the joint line  Skin:    Coloration: Skin is not jaundiced or pale.  Neurological:     General: No focal deficit present.     Mental Status: He is alert and oriented to person, place, and time.  Psychiatric:        Behavior: Behavior normal.      UC Treatments / Results  Labs (all labs ordered are listed, but only abnormal results are displayed) Labs Reviewed  CBC WITH DIFFERENTIAL/PLATELET  BASIC METABOLIC PANEL  URIC ACID    EKG   Radiology DG Knee 2 Views Right  Result Date: 04/11/2022 CLINICAL DATA:  Right knee pain.  No injury. EXAM: RIGHT KNEE - 1-2 VIEW COMPARISON:  None Available. FINDINGS: Minimal chronic enthesopathic change at  the quadriceps insertion on the  patella. No joint effusion. Mildly decreased bone mineralization. Joint spaces are preserved. No acute fracture or dislocation. IMPRESSION: Minimal chronic enthesopathic change at the quadriceps insertion on the patella. Electronically Signed   By: Yvonne Kendall M.D.   On: 04/11/2022 13:02    Procedures Procedures (including critical care time)  Medications Ordered in UC Medications  ketorolac (TORADOL) 30 MG/ML injection 30 mg (has no administration in time range)    Initial Impression / Assessment and Plan / UC Course  I have reviewed the triage vital signs and the nursing notes.  Pertinent labs & imaging results that were available during my care of the patient were reviewed by me and considered in my medical decision making (see chart for details).        X-ray shows some enthesopathic changes at the insertion tendon.  No acute bony problem.  We have drawn blood for CBC, BMP and uric acid.  We will let him know if anything is positive.  I am sending in prednisone for 5 days, and we will give him a shot of Toradol. Final Clinical Impressions(s) / UC Diagnoses   Final diagnoses:  Acute pain of right knee     Discharge Instructions      There are signs of tendon irritation on your x-ray.  There is no bony cyst or acute bony  We have drawn blood to see if there is a problem with gout.  I do not see that you have had a uric acid drawn previously in our charting system.  Take prednisone 20 mg--2 daily for 5 days.  This should help with inflammation and the pain.  Please follow-up with your primary care about this knee pain      ED Prescriptions     Medication Sig Dispense Auth. Provider   predniSONE (DELTASONE) 20 MG tablet Take 2 tablets (40 mg total) by mouth daily with breakfast for 5 days. 10 tablet Barrett Henle, MD   traMADol (ULTRAM) 50 MG tablet Take 1 tablet (50 mg total) by mouth every 6 (six) hours as needed (pain). 12 tablet  Aileana Hodder, Gwenlyn Perking, MD      I have reviewed the PDMP during this encounter.   Barrett Henle, MD 04/11/22 1320    Barrett Henle, MD 04/11/22 1326    Barrett Henle, MD 04/11/22 [phone]

## 2022-04-11 NOTE — Discharge Instructions (Addendum)
There are signs of tendon irritation on your x-ray.  There is no bony cyst or acute bony  We have drawn blood to see if there is a problem with gout.  I do not see that you have had a uric acid drawn previously in our charting system.  Take prednisone 20 mg--2 daily for 5 days.  This should help with inflammation and the pain.  Please follow-up with your primary care about this knee pain

## 2022-04-11 NOTE — ED Triage Notes (Signed)
The patient c/o right knee pain, he states the pain started yesterday after slipping (not falling). The patient states the pain is worse with ambulation. He is using crutches and has a knee brace on.

## 2022-04-12 ENCOUNTER — Telehealth: Payer: Self-pay

## 2022-04-12 NOTE — Telephone Encounter (Signed)
Patient verification complete (name and date of birth).   Patients phone call returned and patient made aware that his lab results from yesterday have not yet resulted.

## 2022-04-13 LAB — CBC WITH DIFFERENTIAL/PLATELET
Basophils Absolute: 0.1 10*3/uL (ref 0.0–0.2)
Basos: 0 %
EOS (ABSOLUTE): 0.1 10*3/uL (ref 0.0–0.4)
Eos: 1 %
Hematocrit: 44.4 % (ref 37.5–51.0)
Hemoglobin: 14.4 g/dL (ref 13.0–17.7)
Immature Grans (Abs): 0.1 10*3/uL (ref 0.0–0.1)
Immature Granulocytes: 1 %
Lymphocytes Absolute: 1.8 10*3/uL (ref 0.7–3.1)
Lymphs: 12 %
MCH: 30.4 pg (ref 26.6–33.0)
MCHC: 32.4 g/dL (ref 31.5–35.7)
MCV: 94 fL (ref 79–97)
Monocytes Absolute: 1.3 10*3/uL — ABNORMAL HIGH (ref 0.1–0.9)
Monocytes: 9 %
Neutrophils Absolute: 11.9 10*3/uL — ABNORMAL HIGH (ref 1.4–7.0)
Neutrophils: 77 %
Platelets: 353 10*3/uL (ref 150–450)
RBC: 4.73 x10E6/uL (ref 4.14–5.80)
RDW: 13.2 % (ref 11.6–15.4)
WBC: 15.3 10*3/uL — ABNORMAL HIGH (ref 3.4–10.8)

## 2022-04-13 LAB — BASIC METABOLIC PANEL
BUN/Creatinine Ratio: 18 (ref 10–24)
BUN: 24 mg/dL (ref 8–27)
CO2: 21 mmol/L (ref 20–29)
Calcium: 9.7 mg/dL (ref 8.6–10.2)
Chloride: 98 mmol/L (ref 96–106)
Creatinine, Ser: 1.31 mg/dL — ABNORMAL HIGH (ref 0.76–1.27)
Glucose: 98 mg/dL (ref 70–99)
Potassium: 3.7 mmol/L (ref 3.5–5.2)
Sodium: 138 mmol/L (ref 134–144)
eGFR: 59 mL/min/{1.73_m2} — ABNORMAL LOW (ref >59)

## 2022-04-13 LAB — URIC ACID: Uric Acid: 8.1 mg/dL (ref 3.8–8.4)

## 2022-04-19 ENCOUNTER — Ambulatory Visit (AMBULATORY_SURGERY_CENTER): Payer: Self-pay

## 2022-04-19 VITALS — Ht 60.0 in | Wt 123.0 lb

## 2022-04-19 DIAGNOSIS — H2511 Age-related nuclear cataract, right eye: Secondary | ICD-10-CM | POA: Diagnosis not present

## 2022-04-19 DIAGNOSIS — H2512 Age-related nuclear cataract, left eye: Secondary | ICD-10-CM | POA: Diagnosis not present

## 2022-04-19 DIAGNOSIS — Z1211 Encounter for screening for malignant neoplasm of colon: Secondary | ICD-10-CM

## 2022-04-19 NOTE — Progress Notes (Signed)
Pt and interpreter present. Pt presents cologuard box stating through interpreter that he is physically unable to tolerate even the small volume Suprep. Sutabs are briefly discussed, but he declines. He reports that he had discussed his situation with his doctor (?).  He is agreeable with offer for Office visit to discuss options/plan. He does not wish to wait until Dec to see Dr. Rush Landmark. He is happy to see Macklen Wilhoite, PA-C on 05/23/22 at 8:30am.  Appt scheduled.

## 2022-05-03 ENCOUNTER — Encounter: Payer: Medicare Other | Admitting: Gastroenterology

## 2022-05-23 ENCOUNTER — Encounter: Payer: Self-pay | Admitting: Physician Assistant

## 2022-05-23 ENCOUNTER — Ambulatory Visit (INDEPENDENT_AMBULATORY_CARE_PROVIDER_SITE_OTHER): Payer: Medicare Other | Admitting: Physician Assistant

## 2022-05-23 VITALS — BP 150/98 | HR 71 | Ht 60.0 in | Wt 127.6 lb

## 2022-05-23 DIAGNOSIS — Z01818 Encounter for other preprocedural examination: Secondary | ICD-10-CM | POA: Diagnosis not present

## 2022-05-23 DIAGNOSIS — Z1211 Encounter for screening for malignant neoplasm of colon: Secondary | ICD-10-CM | POA: Diagnosis not present

## 2022-05-23 NOTE — Patient Instructions (Signed)
Your provider has ordered Cologuard testing as an option for colon cancer screening. This is performed by [facility] and may be out of network with your insurance. PRIOR to completing the test, it is YOUR responsibility to contact your insurance about covered benefits for this test. Your out of pocket expense could be anywhere from $0.00 to $649.00.   When you call to check coverage with your insurer, please provide the following information:   -The ONLY provider of Cologuard is [facility]  - CPT code for Cologuard is 81528.  -[facility] NPI # [id number]  -[facility] Tax ID # [id number]   We have already sent your demographic and insurance information to [facility] (phone number [phone]) and they should contact you within the next week regarding your test. If you have not heard from them within the next week, please call our office at [phone].    

## 2022-05-23 NOTE — Progress Notes (Signed)
Chief Complaint: Discuss Colonoscopy  HPI:  Lance Oneal is a 71 year old [place] male with a past medical history as listed below including GERD and hypertension, who was referred to me by Zola Button, Grayling Congress, * for discussion of a colonoscopy.    04/11/2022 CBC with elevated white count of 15.3 and BMP with a creatinine of 1.31.  At that time patient was seen in the ED for knee pain.  Given Toradol.  He was started on Prednisone for 5 days.    Today, patient presents to clinic accompanied by [place] interpreter.  He explains that about 30 years ago or so he had surgery in [place] for his stomach because something "made a hole in it", ever since then he only eats 1 meal a day around 2:00 and otherwise drinks fluids and can only drink less than a half of a liter a day.  Due to all of this he is very worried about having to drink a bowel prep for a colonoscopy because he does not feel like it will work for him.  He instead would like to do a Cologuard and actually had one at home but was uncertain on how to send it off.    Denies fever, chills, weight loss, change in bowel habits, blood in his stool, nausea, vomiting, abdominal pain or symptoms that awaken him from sleep.  Past Medical History:  Diagnosis Date   BPH (benign prostatic hyperplasia)    GERD (gastroesophageal reflux disease)    History of gastric ulcer    per pt 1970s or 1980s s/p  open surgery for stomach ulcer   Hyperlipidemia    Hypertension    Right hydrocele    Urinary urgency    refractory    Past Surgical History:  Procedure Laterality Date   ABDOMINAL SURGERY     per pt done approxl 1970s or 1980s  for stomach ulcer (has large midline incision)   HYDROCELE EXCISION Right 07/30/2021   Procedure: HYDROCELECTOMY ADULT AND RIGHT ORCHIOPEXY;  Surgeon: Sebastian Ache, MD;  Location: [facility];  Service: Urology;  Laterality: Right;   TRANSURETHRAL RESECTION OF PROSTATE N/A 07/30/2021   Procedure:  TRANSURETHRAL RESECTION OF THE PROSTATE (TURP);  Surgeon: Sebastian Ache, MD;  Location: [facility];  Service: Urology;  Laterality: N/A;    Current Outpatient Medications  Medication Sig Dispense Refill   atorvastatin (LIPITOR) 40 MG tablet Take 1 tablet (40 mg total) by mouth daily. 90 tablet 1   diclofenac Sodium (VOLTAREN) 1 % GEL Apply 4 g topically 4 (four) times daily. 150 g 2   fluticasone (FLONASE) 50 MCG/ACT nasal spray SPRAY 2 SPRAYS INTO EACH NOSTRIL EVERY DAY 48 mL 2   hydrochlorothiazide (HYDRODIURIL) 25 MG tablet Take 1 tablet (25 mg total) by mouth daily. 90 tablet 1   losartan (COZAAR) 25 MG tablet Take 2 tablets (50 mg total) by mouth daily. 180 tablet 0   azithromycin (ZITHROMAX Z-PAK) 250 MG tablet As directed (Patient not taking: Reported on 05/23/2022) 6 each 0   finasteride (PROSCAR) 5 MG tablet Take 1 tablet (5 mg total) by mouth daily. (Patient not taking: Reported on 05/23/2022) 90 tablet 1   loratadine (CLARITIN) 10 MG tablet Take 1 tablet (10 mg total) by mouth daily. (Patient not taking: Reported on 05/23/2022) 30 tablet 11   pantoprazole (PROTONIX) 40 MG tablet Take 1 tablet (40 mg total) by mouth daily. (Patient not taking: Reported on 05/23/2022) 90 tablet 3   tolterodine (  DETROL) 2 MG tablet 1 TAB DAILY AS NEEDED FOR URINARY URGENCY (Patient not taking: Reported on 05/23/2022)     traMADol (ULTRAM) 50 MG tablet Take 1 tablet (50 mg total) by mouth every 6 (six) hours as needed (pain). (Patient not taking: Reported on 05/23/2022) 12 tablet 0   No current facility-administered medications for this visit.    Allergies as of 05/23/2022 - Review Complete 05/23/2022  Allergen Reaction Noted   Lisinopril  12/30/2012    Family History  Problem Relation Age of Onset   Hypertension Mother    Liver cancer Father    Liver cancer Other    Colon cancer Neg Hx    Pancreatic cancer Neg Hx    Stomach cancer Neg Hx     Social History    Socioeconomic History   Marital status: Married    Spouse name: Not on file   Number of children: 1   Years of education: Not on file   Highest education level: Not on file  Occupational History   Occupation: retired  Tobacco Use   Smoking status: Former    Packs/day: 0.50    Types: Cigarettes    Quit date: 02/26/2005    Years since quitting: 17.2   Smokeless tobacco: Never  Substance and Sexual Activity   Alcohol use: Yes   Drug use: Never   Sexual activity: Yes    Partners: Female  Other Topics Concern   Not on file  Social History Narrative   Not on file   Social Determinants of Health   Financial Resource Strain: Low Risk  (10/26/2021)   Overall Financial Resource Strain (CARDIA)    Difficulty of Paying Living Expenses: Not hard at all  Food Insecurity: No Food Insecurity (10/26/2021)   Hunger Vital Sign    Worried About Running Out of Food in the Last Year: Never true    Ran Out of Food in the Last Year: Never true  Transportation Needs: No Transportation Needs (10/26/2021)   PRAPARE - Administrator, Civil Service (Medical): No    Lack of Transportation (Non-Medical): No  Physical Activity: Not on file  Stress: No Stress Concern Present (10/26/2021)   [employer] of Occupational Health - Occupational Stress Questionnaire    Feeling of Stress : Not at all  Social Connections: Moderately Integrated (10/26/2021)   Social Connection and Isolation Panel [NHANES]    Frequency of Communication with Friends and Family: More than three times a week    Frequency of Social Gatherings with Friends and Family: More than three times a week    Attends Religious Services: Never    Database administrator or Organizations: Yes    Attends Banker Meetings: Never    Marital Status: Married  Catering manager Violence: Not At Risk (10/26/2021)   Humiliation, Afraid, Rape, and Kick questionnaire    Fear of Current or Ex-Partner: No    Emotionally Abused:  No    Physically Abused: No    Sexually Abused: No    Review of Systems:    Constitutional: No weight loss, fever or chills Skin: No rash Cardiovascular: No chest pain Respiratory: No SOB  Gastrointestinal: See HPI and otherwise negative Genitourinary: No dysuria  Neurological: No headache, dizziness or syncope Musculoskeletal: No new muscle or joint pain Hematologic: No bleeding  Psychiatric: No history of depression or anxiety   Physical Exam:  Vital signs: BP (!) 150/98   Pulse 71   Ht 5' (1.524 m)  Wt 127 lb 9.6 oz (57.9 kg)   SpO2 97%   BMI 24.92 kg/m    Constitutional:   Pleasant [place] male appears to be in NAD, Well developed, Well nourished, alert and cooperative Head:  Normocephalic and atraumatic. Eyes:   PEERL, EOMI. No icterus. Conjunctiva pink. Ears:  Normal auditory acuity. Neck:  Supple Throat: Oral cavity and pharynx without inflammation, swelling or lesion.  Respiratory: Respirations even and unlabored. Lungs clear to auscultation bilaterally.   No wheezes, crackles, or rhonchi.  Cardiovascular: Normal S1, S2. No MRG. Regular rate and rhythm. No peripheral edema, cyanosis or pallor.  Gastrointestinal:  Soft, nondistended, nontender. No rebound or guarding. Normal bowel sounds. No appreciable masses or hepatomegaly. + Full-length midline surgical scar down the abdomen Rectal:  Not performed.  Msk:  Symmetrical without gross deformities. Without edema, no deformity or joint abnormality.  Neurologic:  Alert and  oriented x4;  grossly normal neurologically.  Skin:   Dry and intact without significant lesions or rashes. Psychiatric:Demonstrates good judgement and reason without abnormal affect or behaviors.  RELEVANT LABS AND IMAGING: CBC    Component Value Date/Time   WBC 15.3 (H) 04/11/2022 1350   WBC 6.9 01/13/2022 1344   RBC 4.73 04/11/2022 1350   RBC 4.79 01/13/2022 1344   HGB 14.4 04/11/2022 1350   HCT 44.4 04/11/2022 1350   PLT 353  04/11/2022 1350   MCV 94 04/11/2022 1350   MCH 30.4 04/11/2022 1350   MCH 30.2 11/25/2012 1110   MCHC 32.4 04/11/2022 1350   MCHC 33.4 01/13/2022 1344   RDW 13.2 04/11/2022 1350   LYMPHSABS 1.8 04/11/2022 1350   MONOABS 0.5 01/13/2022 1344   EOSABS 0.1 04/11/2022 1350   BASOSABS 0.1 04/11/2022 1350    CMP     Component Value Date/Time   NA 138 04/11/2022 1350   K 3.7 04/11/2022 1350   CL 98 04/11/2022 1350   CO2 21 04/11/2022 1350   GLUCOSE 98 04/11/2022 1350   GLUCOSE 76 01/13/2022 1344   BUN 24 04/11/2022 1350   CREATININE 1.31 (H) 04/11/2022 1350   CALCIUM 9.7 04/11/2022 1350   PROT 7.2 01/13/2022 1344   ALBUMIN 4.5 01/13/2022 1344   AST 21 01/13/2022 1344   ALT 20 01/13/2022 1344   ALKPHOS 56 01/13/2022 1344   BILITOT 1.0 01/13/2022 1344   GFRNONAA >90 11/25/2012 1110   GFRAA >90 11/25/2012 1110    Assessment: 1.  Screening for colorectal cancer: Patient tells me he drinks less than a half of a liter of fluid a day due to previous stomach surgery 30 years ago, he would like to proceed with Cologuard  Plan: 1.  Discussed Cologuard in full detail with him.  Unfortunately we did not have a demo set or packet here in clinic but he was talked through the directions specifically.  If he needs to he is going to bring his box back up here with an interpreter so that someone can talk him through the details of how to collect this and send it off. 2.  Did discuss that if the Cologuard is positive this means we need to find a way to do his screening colonoscopy.  Would recommend the smallest volume of bowel prep possible and possibly a clear liquid diet for 2 days with split prep over those days. 3.  Patient assigned to Dr. Myrtie Neitheranis today.  He will follow in clinic per recommendations after testing above.  Hyacinth MeekerJennifer Lucius Wise, PA-C [facility] 05/23/2022, 8:23 AM  Cc:  Zola Button, Grayling Congress, *

## 2022-05-24 ENCOUNTER — Telehealth: Payer: Self-pay

## 2022-05-24 DIAGNOSIS — Z9889 Other specified postprocedural states: Secondary | ICD-10-CM

## 2022-05-24 NOTE — Progress Notes (Signed)
____________________________________________________________  Attending physician addendum:  Thank you for sending this case to me. I have reviewed the entire note and agree with the plan.  This patient had an unknown (but probably gastric) surgery, reportedly for ulcer. ([place] ?)   He should have an upper GI series (no small bowel follow-through) to evaluate the anatomy and make sure he does not have an anastomotic stricture contributing to his symptoms.  That way we will also know if an upper endoscopy is warranted regardless of his Cologuard results.  Amada Jupiter, MD  ____________________________________________________________

## 2022-05-24 NOTE — Telephone Encounter (Signed)
-----   Message from Unk Lightning, [place] sent at 05/24/2022 10:58 AM EST ----- Regarding: needs upper gi as well Can you call patient with the help of interpreter to let him know that Dr. Would like him to have an upper GI study as well to make sure we not need to do an EGD.  Thanks, JL L ----- Message ----- From: Sherrilyn Rist, MD Sent: 05/24/2022   8:34 AM EST To: Unk Lightning, PA     ----- Message ----- From: Unk Lightning, [place] Sent: 05/23/2022   8:52 AM EST To: Sherrilyn Rist, MD

## 2022-05-24 NOTE — Telephone Encounter (Signed)
Called and spoke with patient via interpreter (Interpreter ID: [id number]). We relayed Dr. Myrtie Neither' recommendations. Pt would like to proceed with upper GI series. Pt knows to expect a call from radiology to set up his appt. Pt verbalized understanding and had no concerns at the end of the call.  Upper GI series order in epic. Secure staff message sent to radiology scheduling to contact patient to set up his appt.

## 2022-06-01 NOTE — Telephone Encounter (Signed)
Upper GI series scheduled for Thursday, 06/09/22 at 10 am.

## 2022-06-09 ENCOUNTER — Ambulatory Visit (HOSPITAL_COMMUNITY)
Admission: RE | Admit: 2022-06-09 | Discharge: 2022-06-09 | Disposition: A | Discharge status: Home / Self Care | Payer: Medicare Other | Source: Ambulatory Visit | Attending: Physician Assistant | Admitting: Physician Assistant

## 2022-06-09 DIAGNOSIS — Z9889 Other specified postprocedural states: Secondary | ICD-10-CM | POA: Insufficient documentation

## 2022-06-09 DIAGNOSIS — K224 Dyskinesia of esophagus: Secondary | ICD-10-CM | POA: Diagnosis not present

## 2022-06-13 DIAGNOSIS — Z1211 Encounter for screening for malignant neoplasm of colon: Secondary | ICD-10-CM | POA: Diagnosis not present

## 2022-06-14 ENCOUNTER — Other Ambulatory Visit: Payer: Self-pay

## 2022-06-16 ENCOUNTER — Other Ambulatory Visit: Payer: Self-pay | Admitting: Family Medicine

## 2022-06-16 ENCOUNTER — Other Ambulatory Visit: Payer: Self-pay

## 2022-06-16 DIAGNOSIS — R1013 Epigastric pain: Secondary | ICD-10-CM

## 2022-06-16 DIAGNOSIS — I10 Essential (primary) hypertension: Secondary | ICD-10-CM

## 2022-06-16 MED ORDER — PANTOPRAZOLE SODIUM 40 MG PO TBEC: 40.0000 mg | DELAYED_RELEASE_TABLET | Freq: Every day | ORAL | 0 refills | Status: DC

## 2022-06-22 LAB — COLOGUARD: COLOGUARD: NEGATIVE

## 2022-07-19 ENCOUNTER — Ambulatory Visit: Payer: Medicare Other | Admitting: Physician Assistant

## 2022-08-04 ENCOUNTER — Ambulatory Visit (INDEPENDENT_AMBULATORY_CARE_PROVIDER_SITE_OTHER): Payer: 59 | Admitting: Physician Assistant

## 2022-08-04 ENCOUNTER — Encounter: Payer: Self-pay | Admitting: Physician Assistant

## 2022-08-04 VITALS — BP 130/68 | HR 85 | Ht 60.0 in | Wt 128.6 lb

## 2022-08-04 DIAGNOSIS — R1319 Other dysphagia: Secondary | ICD-10-CM | POA: Diagnosis not present

## 2022-08-04 DIAGNOSIS — Z1211 Encounter for screening for malignant neoplasm of colon: Secondary | ICD-10-CM

## 2022-08-04 DIAGNOSIS — Z8719 Personal history of other diseases of the digestive system: Secondary | ICD-10-CM

## 2022-08-04 DIAGNOSIS — K219 Gastro-esophageal reflux disease without esophagitis: Secondary | ICD-10-CM | POA: Diagnosis not present

## 2022-08-04 MED ORDER — PANTOPRAZOLE SODIUM 40 MG PO TBEC: 40.0000 mg | DELAYED_RELEASE_TABLET | Freq: Every day | ORAL | 3 refills | Status: DC

## 2022-08-04 NOTE — Patient Instructions (Signed)
  If you are age 72 or older, your body mass index should be between 23-30. Your Body mass index is 25.12 kg/m. If this is out of the aforementioned range listed, please consider follow up with your Primary Care Provider.  If you are age 66 or younger, your body mass index should be between 19-25. Your Body mass index is 25.12 kg/m. If this is out of the aformentioned range listed, please consider follow up with your Primary Care Provider.   Follow up in a year.  The [facility] providers would like to encourage you to use [facility] to communicate with providers for non-urgent requests or questions.  Due to long hold times on the telephone, sending your provider a message by [facility] may be a faster and more efficient way to get a response.  Please allow 48 business hours for a response.  Please remember that this is for non-urgent requests.

## 2022-08-04 NOTE — Progress Notes (Signed)
Chief Complaint: Follow-up Cologuard and GERD  HPI:    Lance Oneal is a 72 year old [place] male with a past medical history as listed below including GERD and previous duodenal ulcer status postrepair in [place], signed to Dr. Myrtie Neither, who returns clinic today for follow-up of his Cologuard and GERD.    05/23/2022 patient given a Cologuard and at the time was having some symptoms of dry mouth excetra.  It was recommended that he use his Pantoprazole 40 mg daily.    06/09/2022 upper GI study showed mild esophageal dysmotility and otherwise normal appearance of the stomach and esophagus.  Question of mild duodenal bulb distortion with a pattern that raised the possibility of sequela of scarring from prior ulcer disease or postoperative change.  Small diverticulum associated duodenal bulb.    Today, interpreter present at time of exam, the patient tells me that he does fine as long as he uses his Pantoprazole 40 mg every day.  He does ask what we are treating with this medication and wonders if he needs to stay on it.  Also asked some questions about some difficulty he has when he eats dry nuts at night with no water.  Tells me he is worried about frequent urination so he tends not to drink a lot.    Denies fever, chills, weight loss or change in bowel habits.  Past Medical History:  Diagnosis Date   BPH (benign prostatic hyperplasia)    GERD (gastroesophageal reflux disease)    History of gastric ulcer    per pt 1970s or 1980s s/p  open surgery for stomach ulcer   Hyperlipidemia    Hypertension    Right hydrocele    Urinary urgency    refractory    Past Surgical History:  Procedure Laterality Date   ABDOMINAL SURGERY     per pt done approxl 1970s or 1980s  for stomach ulcer (has large midline incision)   HYDROCELE EXCISION Right 07/30/2021   Procedure: HYDROCELECTOMY ADULT AND RIGHT ORCHIOPEXY;  Surgeon: Sebastian Ache, MD;  [facility];  Service: Urology;   Laterality: Right;   TRANSURETHRAL RESECTION OF PROSTATE N/A 07/30/2021   Procedure: TRANSURETHRAL RESECTION OF THE PROSTATE (TURP);  Surgeon: Sebastian Ache, MD;  Location: [facility];  Service: Urology;  Laterality: N/A;    Current Outpatient Medications  Medication Sig Dispense Refill   atorvastatin (LIPITOR) 40 MG tablet Take 1 tablet (40 mg total) by mouth daily. 90 tablet 1   diclofenac Sodium (VOLTAREN) 1 % GEL Apply 4 g topically 4 (four) times daily. 150 g 2   finasteride (PROSCAR) 5 MG tablet Take 1 tablet (5 mg total) by mouth daily. 90 tablet 1   fluticasone (FLONASE) 50 MCG/ACT nasal spray SPRAY 2 SPRAYS INTO EACH NOSTRIL EVERY DAY 48 mL 2   hydrochlorothiazide (HYDRODIURIL) 25 MG tablet Take 1 tablet (25 mg total) by mouth daily. 90 tablet 1   loratadine (CLARITIN) 10 MG tablet Take 1 tablet (10 mg total) by mouth daily. 30 tablet 11   losartan (COZAAR) 25 MG tablet Take 2 tablets (50 mg total) by mouth daily. 180 tablet 0   tolterodine (DETROL) 2 MG tablet      traMADol (ULTRAM) 50 MG tablet Take 1 tablet (50 mg total) by mouth every 6 (six) hours as needed (pain). 12 tablet 0   pantoprazole (PROTONIX) 40 MG tablet Take 1 tablet (40 mg total) by mouth daily. 90 tablet 3   No current facility-administered medications  for this visit.    Allergies as of 08/04/2022 - Review Complete 08/04/2022  Allergen Reaction Noted   Lisinopril  12/30/2012    Family History  Problem Relation Age of Onset   Hypertension Mother    Liver cancer Father    Liver cancer Other    Colon cancer Neg Hx    Pancreatic cancer Neg Hx    Stomach cancer Neg Hx     Social History   Socioeconomic History   Marital status: Married    Spouse name: Not on file   Number of children: 1   Years of education: Not on file   Highest education level: Not on file  Occupational History   Occupation: retired  Tobacco Use   Smoking status: Former    Packs/day: 0.50    Types: Cigarettes     Quit date: 02/26/2005    Years since quitting: 17.4   Smokeless tobacco: Never  Substance and Sexual Activity   Alcohol use: Yes   Drug use: Never   Sexual activity: Yes    Partners: Female  Other Topics Concern   Not on file  Social History Narrative   Not on file   Social Determinants of Health   Financial Resource Strain: Low Risk  (10/26/2021)   Overall Financial Resource Strain (CARDIA)    Difficulty of Paying Living Expenses: Not hard at all  Food Insecurity: No Food Insecurity (10/26/2021)   Hunger Vital Sign    Worried About Running Out of Food in the Last Year: Never true    [place] in the Last Year: Never true  Transportation Needs: No Transportation Needs (10/26/2021)   PRAPARE - Hydrologist (Medical): No    Lack of Transportation (Non-Medical): No  Physical Activity: Not on file  Stress: No Stress Concern Present (10/26/2021)   Cataract    Feeling of Stress : Not at all  Social Connections: Moderately Integrated (10/26/2021)   Social Connection and Isolation Panel [NHANES]    Frequency of Communication with Friends and Family: More than three times a week    Frequency of Social Gatherings with Friends and Family: More than three times a week    Attends Religious Services: Never    Marine scientist or Organizations: Yes    Attends Archivist Meetings: Never    Marital Status: Married  Human resources officer Violence: Not At Risk (10/26/2021)   Humiliation, Afraid, Rape, and Kick questionnaire    Fear of Current or Ex-Partner: No    Emotionally Abused: No    Physically Abused: No    Sexually Abused: No    Review of Systems:    Constitutional: No weight loss, fever or chills Cardiovascular: No chest pain Respiratory: No SOB  Gastrointestinal: See HPI and otherwise negative   Physical Exam:  Vital signs: BP 130/68   Pulse 85   Ht 5' (1.524 m)    Wt 128 lb 9.6 oz (58.3 kg)   BMI 25.12 kg/m   Constitutional:   Pleasant [place] male appears to be in NAD, Well developed, Well nourished, alert and cooperative Respiratory: Respirations even and unlabored. Lungs clear to auscultation bilaterally.   No wheezes, crackles, or rhonchi.  Cardiovascular: Normal S1, S2. No MRG. Regular rate and rhythm. No peripheral edema, cyanosis or pallor.  Gastrointestinal:  Soft, nondistended, nontender. No rebound or guarding. Normal bowel sounds. No appreciable masses or hepatomegaly. Rectal:  Not  performed.  Psychiatric: Oriented to person, place and time. Demonstrates good judgement and reason without abnormal affect or behaviors.  See HPI for recent imaging.  Assessment: 1.  Colon cancer screening: Negative Cologuard 2.  Dry mouth and history of duodenal ulcer: Symptoms completely resolved on Pantoprazole 40 mg daily, recent upper GI with likely duodenal ulcer in the past 3.  Mild esophageal dysmotility: Seen on upper GI, patient does have trouble when eating some dry foods occasionally  Plan: 1.  Discussed with patient that he will need repeat colon cancer screening in 3 years with Cologuard. 2.  Discussed patient's symptoms of some dysphagia when eating nuts at night with no water.  Discussed he has mild esophageal dysmotility on imaging which could be contributing, but likely would resolve if he would just drink some water with any dry foods that he consumes. 3.  Continue Pantoprazole 40 mg daily, 30-60 minutes before breakfast #90 with 3 refills. 4.  Patient to follow clinic in a year for further refills or sooner if necessary.  Ellouise Newer, PA-C [facility] 08/04/2022, 2:38 PM  Cc: Carollee Herter, Alferd Apa, *

## 2022-08-04 NOTE — Progress Notes (Signed)
____________________________________________________________  Attending physician addendum:  Thank you for sending this case to me. I have reviewed the entire note and agree with the plan.  Would be best to have him follow up in several months to try de-escalating his PPI to H2RA if possible.  Wilfrid Lund, MD  ____________________________________________________________

## 2022-08-05 ENCOUNTER — Telehealth: Payer: Self-pay

## 2022-08-05 ENCOUNTER — Other Ambulatory Visit: Payer: Self-pay | Admitting: Family Medicine

## 2022-08-05 NOTE — Telephone Encounter (Signed)
Patient has been scheduled for a 76-month follow up with Dr. Loletha Carrow on Thursday, 10/06/22 at 9:20 am. Appt information mailed and sent to patient via [place].

## 2022-08-05 NOTE — Telephone Encounter (Signed)
-----  Message from [place], [place] sent at 08/05/2022  9:37 AM EST ----- Regarding: Needs follow-up Dr. Loletha Carrow would like to see this patient in 2 to 3 months to try to de-escalate his PPI therapy to an H2 blocker.  Can you make him an appointment with him.  Thanks, JL L ----- Message ----- From: Doran Stabler, MD Sent: 08/04/2022   5:00 PM EST To: Levin Erp, PA     ----- Message ----- From: Levin Erp, [place] Sent: 08/04/2022   2:45 PM EST To: Doran Stabler, MD

## 2022-08-09 ENCOUNTER — Other Ambulatory Visit: Payer: Self-pay | Admitting: Family Medicine

## 2022-09-13 ENCOUNTER — Other Ambulatory Visit: Payer: Self-pay | Admitting: Family Medicine

## 2022-09-13 DIAGNOSIS — I10 Essential (primary) hypertension: Secondary | ICD-10-CM

## 2022-09-14 ENCOUNTER — Other Ambulatory Visit (HOSPITAL_BASED_OUTPATIENT_CLINIC_OR_DEPARTMENT_OTHER): Payer: Self-pay

## 2022-09-19 ENCOUNTER — Ambulatory Visit: Payer: 59 | Admitting: Family Medicine

## 2022-09-22 ENCOUNTER — Ambulatory Visit (INDEPENDENT_AMBULATORY_CARE_PROVIDER_SITE_OTHER): Payer: 59 | Admitting: Family Medicine

## 2022-09-22 ENCOUNTER — Encounter: Payer: Self-pay | Admitting: Family Medicine

## 2022-09-22 VITALS — BP 138/80 | HR 58 | Temp 97.7°F | Resp 18 | Ht 60.0 in | Wt 131.6 lb

## 2022-09-22 DIAGNOSIS — E785 Hyperlipidemia, unspecified: Secondary | ICD-10-CM | POA: Diagnosis not present

## 2022-09-22 DIAGNOSIS — R200 Anesthesia of skin: Secondary | ICD-10-CM

## 2022-09-22 DIAGNOSIS — I10 Essential (primary) hypertension: Secondary | ICD-10-CM | POA: Diagnosis not present

## 2022-09-22 DIAGNOSIS — Z87438 Personal history of other diseases of male genital organs: Secondary | ICD-10-CM | POA: Diagnosis not present

## 2022-09-22 DIAGNOSIS — I700 Atherosclerosis of aorta: Secondary | ICD-10-CM | POA: Diagnosis not present

## 2022-09-22 LAB — COMPREHENSIVE METABOLIC PANEL
ALT: 24 U/L (ref 0–53)
AST: 20 U/L (ref 0–37)
Albumin: 4 g/dL (ref 3.5–5.2)
Alkaline Phosphatase: 62 U/L (ref 39–117)
BUN: 19 mg/dL (ref 6–23)
CO2: 28 mEq/L (ref 19–32)
Calcium: 8.9 mg/dL (ref 8.4–10.5)
Chloride: 105 mEq/L (ref 96–112)
Creatinine, Ser: 0.97 mg/dL (ref 0.40–1.50)
GFR: 78.61 mL/min (ref >60.00)
Glucose, Bld: 89 mg/dL (ref 70–99)
Potassium: 4.6 mEq/L (ref 3.5–5.1)
Sodium: 139 mEq/L (ref 135–145)
Total Bilirubin: 1.3 mg/dL — ABNORMAL HIGH (ref 0.2–1.2)
Total Protein: 6.6 g/dL (ref 6.0–8.3)

## 2022-09-22 LAB — LIPID PANEL
Cholesterol: 157 mg/dL (ref 0–200)
HDL: 40.6 mg/dL (ref >39.00)
LDL Cholesterol: 81 mg/dL (ref 0–99)
NonHDL: 116.35
Total CHOL/HDL Ratio: 4
Triglycerides: 178 mg/dL — ABNORMAL HIGH (ref 0.0–149.0)
VLDL: 35.6 mg/dL (ref 0.0–40.0)

## 2022-09-22 LAB — CBC WITH DIFFERENTIAL/PLATELET
Basophils Absolute: 0 10*3/uL (ref 0.0–0.1)
Basophils Relative: 0.3 % (ref 0.0–3.0)
Eosinophils Absolute: 0.1 10*3/uL (ref 0.0–0.7)
Eosinophils Relative: 1.6 % (ref 0.0–5.0)
HCT: 41.2 % (ref 39.0–52.0)
Hemoglobin: 14 g/dL (ref 13.0–17.0)
Lymphocytes Relative: 26.1 % (ref 12.0–46.0)
Lymphs Abs: 1.7 10*3/uL (ref 0.7–4.0)
MCHC: 33.9 g/dL (ref 30.0–36.0)
MCV: 90.2 fl (ref 78.0–100.0)
Monocytes Absolute: 0.4 10*3/uL (ref 0.1–1.0)
Monocytes Relative: 6.8 % (ref 3.0–12.0)
Neutro Abs: 4.1 10*3/uL (ref 1.4–7.7)
Neutrophils Relative %: 65.2 % (ref 43.0–77.0)
Platelets: 306 10*3/uL (ref 150.0–400.0)
RBC: 4.57 Mil/uL (ref 4.22–5.81)
RDW: 13.8 % (ref 11.5–15.5)
WBC: 6.3 10*3/uL (ref 4.0–10.5)

## 2022-09-22 MED ORDER — FINASTERIDE 5 MG PO TABS: 5.0000 mg | ORAL_TABLET | Freq: Every day | ORAL | 1 refills | Status: AC

## 2022-09-22 MED ORDER — HYDROCHLOROTHIAZIDE 25 MG PO TABS: 25.0000 mg | ORAL_TABLET | Freq: Every day | ORAL | 1 refills | Status: DC

## 2022-09-22 MED ORDER — ATORVASTATIN CALCIUM 40 MG PO TABS: 40.0000 mg | ORAL_TABLET | Freq: Every day | ORAL | 1 refills | Status: DC

## 2022-09-22 MED ORDER — LOSARTAN POTASSIUM 25 MG PO TABS: 50.0000 mg | ORAL_TABLET | Freq: Every day | ORAL | 0 refills | Status: DC

## 2022-09-22 NOTE — Progress Notes (Signed)
Subjective:   By signing my name below, I, Lance Oneal, attest that this documentation has been prepared under the direction and in the presence of Lance Held, DO 09/22/22   Patient ID: Lance Oneal, male    DOB: 1950/11/18, 72 y.o.   MRN: [id number]  Chief Complaint  Patient presents with   Hypertension   Hyperlipidemia   Follow-up    HPI Patient is in today for a follow up. He is accompanied by his wife.  He reports that he follows up with ophthalmology every 4 months to monitor the progression of his cataracts. He is still considering getting surgery within the year. He reports he is still able to drive well.   He complains of bilateral numbness and tingling in his hands. He notices this most while gardening. He states it often happens at night time but sometimes will occur during the day. He denies one hand being worse that the other. He is not interested in receiving a wrist splint.   Past Medical History:  Diagnosis Date   BPH (benign prostatic hyperplasia)    GERD (gastroesophageal reflux disease)    History of gastric ulcer    per pt 1970s or 1980s s/p  open surgery for stomach ulcer   Hyperlipidemia    Hypertension    Right hydrocele    Urinary urgency    refractory    Past Surgical History:  Procedure Laterality Date   ABDOMINAL SURGERY     per pt done approxl 1970s or 1980s  for stomach ulcer (has large midline incision)   HYDROCELE EXCISION Right 07/30/2021   Procedure: HYDROCELECTOMY ADULT AND RIGHT ORCHIOPEXY;  Surgeon: Alexis Frock, MD;  Location: [facility];  Service: Urology;  Laterality: Right;   TRANSURETHRAL RESECTION OF PROSTATE N/A 07/30/2021   Procedure: TRANSURETHRAL RESECTION OF THE PROSTATE (TURP);  Surgeon: Alexis Frock, MD;  Location: [facility];  Service: Urology;  Laterality: N/A;    Family History  Problem Relation Age of Onset   Hypertension Mother    Liver cancer Father    Liver cancer Other     Colon cancer Neg Hx    Pancreatic cancer Neg Hx    Stomach cancer Neg Hx     Social History   Socioeconomic History   Marital status: Married    Spouse name: Not on file   Number of children: 1   Years of education: Not on file   Highest education level: Not on file  Occupational History   Occupation: retired  Tobacco Use   Smoking status: Former    Packs/day: .5    Types: Cigarettes    Quit date: 02/26/2005    Years since quitting: 17.5   Smokeless tobacco: Never  Substance and Sexual Activity   Alcohol use: Yes   Drug use: Never   Sexual activity: Yes    Partners: Female  Other Topics Concern   Not on file  Social History Narrative   Not on file   Social Determinants of Health   Financial Resource Strain: Low Risk  (10/26/2021)   Overall Financial Resource Strain (CARDIA)    Difficulty of Paying Living Expenses: Not hard at all  Food Insecurity: No Food Insecurity (10/26/2021)   Hunger Vital Sign    Worried About Running Out of Food in the Last Year: Never true    [place] in the Last Year: Never true  Transportation Needs: No Transportation Needs (10/26/2021)   [place] -  Hydrologist (Medical): No    Lack of Transportation (Non-Medical): No  Physical Activity: Not on file  Stress: No Stress Concern Present (10/26/2021)   [place]    Feeling of Stress : Not at all  Social Connections: Moderately Integrated (10/26/2021)   Social Connection and Isolation Panel [NHANES]    Frequency of Communication with Friends and Family: More than three times a week    Frequency of Social Gatherings with Friends and Family: More than three times a week    Attends Religious Services: Never    Marine scientist or Organizations: Yes    Attends Archivist Meetings: Never    Marital Status: Married  Human resources officer Violence: Not At Risk (10/26/2021)   Humiliation,  Afraid, Rape, and Kick questionnaire    Fear of Current or Ex-Partner: No    Emotionally Abused: No    Physically Abused: No    Sexually Abused: No    Outpatient Medications Prior to Visit  Medication Sig Dispense Refill   diclofenac Sodium (VOLTAREN) 1 % GEL Apply 4 g topically 4 (four) times daily. 150 g 2   fluticasone (FLONASE) 50 MCG/ACT nasal spray SPRAY 2 SPRAYS INTO EACH NOSTRIL EVERY DAY 48 mL 2   levocetirizine (XYZAL) 5 MG tablet TAKE 1 TABLET BY MOUTH EVERY DAY IN THE EVENING 90 tablet 1   loratadine (CLARITIN) 10 MG tablet Take 1 tablet (10 mg total) by mouth daily. 30 tablet 11   pantoprazole (PROTONIX) 40 MG tablet Take 1 tablet (40 mg total) by mouth daily. 90 tablet 3   tolterodine (DETROL) 2 MG tablet      traMADol (ULTRAM) 50 MG tablet Take 1 tablet (50 mg total) by mouth every 6 (six) hours as needed (pain). 12 tablet 0   atorvastatin (LIPITOR) 40 MG tablet Take 1 tablet (40 mg total) by mouth daily. 90 tablet 1   finasteride (PROSCAR) 5 MG tablet Take 1 tablet (5 mg total) by mouth daily. 90 tablet 1   hydrochlorothiazide (HYDRODIURIL) 25 MG tablet Take 1 tablet (25 mg total) by mouth daily. 90 tablet 1   losartan (COZAAR) 25 MG tablet TAKE 2 TABLETS BY MOUTH EVERY DAY 180 tablet 0   No facility-administered medications prior to visit.    Allergies  Allergen Reactions   Lisinopril     Cough     Review of Systems  Constitutional:  Negative for fever and malaise/fatigue.  HENT:  Negative for congestion.   Eyes:  Negative for blurred vision.  Respiratory:  Negative for cough and shortness of breath.   Cardiovascular:  Negative for chest pain, palpitations and leg swelling.  Gastrointestinal:  Negative for vomiting.  Musculoskeletal:  Negative for back pain.  Skin:  Negative for rash.  Neurological:  Positive for tingling (bilateral hands, (+) numbness). Negative for loss of consciousness and headaches.       Objective:    Physical Exam Vitals and  nursing note reviewed.  Constitutional:      Appearance: He is well-developed.  HENT:     Head: Normocephalic and atraumatic.     Nose: Nose normal.  Eyes:     Pupils: Pupils are equal, round, and reactive to light.  Neck:     Thyroid: No thyromegaly.  Cardiovascular:     Rate and Rhythm: Normal rate and regular rhythm.     Heart sounds: No murmur heard. Pulmonary:  Effort: Pulmonary effort is normal. No respiratory distress.     Breath sounds: Normal breath sounds. No wheezing or rales.  Chest:     Chest wall: No tenderness.  Musculoskeletal:        General: No tenderness.     Cervical back: Normal range of motion and neck supple.  Skin:    General: Skin is warm and dry.  Neurological:     General: No focal deficit present.     Mental Status: He is alert and oriented to person, place, and time.  Psychiatric:        Behavior: Behavior normal.        Thought Content: Thought content normal.        Judgment: Judgment normal.     BP 138/80 (BP Location: Left Arm, Patient Position: Sitting, Cuff Size: Normal)   Pulse (!) 58   Temp 97.7 F (36.5 C) (Oral)   Resp 18   Ht 5' (1.524 m)   Wt 131 lb 9.6 oz (59.7 kg)   SpO2 98%   BMI 25.70 kg/m  Wt Readings from Last 3 Encounters:  09/22/22 131 lb 9.6 oz (59.7 kg)  08/04/22 128 lb 9.6 oz (58.3 kg)  05/23/22 127 lb 9.6 oz (57.9 kg)       Assessment & Plan:  Hyperlipidemia, unspecified hyperlipidemia type Assessment & Plan: Encourage heart healthy diet such as MIND or DASH diet, increase exercise, avoid trans fats, simple carbohydrates and processed foods, consider a krill or fish or flaxseed oil cap daily.    Orders: -     CBC with Differential/Platelet -     Comprehensive metabolic panel -     Lipid panel -     Atorvastatin Calcium; Take 1 tablet (40 mg total) by mouth daily.  Dispense: 90 tablet; Refill: 1  History of BPH -     Finasteride; Take 1 tablet (5 mg total) by mouth daily.  Dispense: 90 tablet; Refill:  1  Essential hypertension Assessment & Plan: Well controlled, no changes to meds. Encouraged heart healthy diet such as the DASH diet and exercise as tolerated.    Orders: -     CBC with Differential/Platelet -     Comprehensive metabolic panel -     Lipid panel -     hydroCHLOROthiazide; Take 1 tablet (25 mg total) by mouth daily.  Dispense: 90 tablet; Refill: 1 -     Losartan Potassium; Take 2 tablets (50 mg total) by mouth daily.  Dispense: 180 tablet; Refill: 0  Atherosclerosis of aorta (HCC)  Numbness in both hands -     Ambulatory referral to Orthopedic Surgery     I,Rachel Rivera,acting as a scribe for Lance Held, DO.,have documented all relevant documentation on the behalf of Lance Held, DO,as directed by  Lance Held, DO while in the presence of Lance Held, DO.   I, Lance Held, DO, personally preformed the services described in this documentation.  All medical record entries made by the scribe were at my direction and in my presence.  I have reviewed the chart and discharge instructions (if applicable) and agree that the record reflects my personal performance and is accurate and complete. 09/22/22   Lance Held, DO

## 2022-09-22 NOTE — Assessment & Plan Note (Signed)
Well controlled, no changes to meds. Encouraged heart healthy diet such as the DASH diet and exercise as tolerated.  °

## 2022-09-22 NOTE — Patient Instructions (Signed)

## 2022-09-22 NOTE — Assessment & Plan Note (Signed)
Encourage heart healthy diet such as MIND or DASH diet, increase exercise, avoid trans fats, simple carbohydrates and processed foods, consider a krill or fish or flaxseed oil cap daily.  °

## 2022-09-30 ENCOUNTER — Ambulatory Visit: Payer: 59 | Admitting: Orthopaedic Surgery

## 2022-10-06 ENCOUNTER — Ambulatory Visit (INDEPENDENT_AMBULATORY_CARE_PROVIDER_SITE_OTHER): Payer: 59 | Admitting: Gastroenterology

## 2022-10-06 ENCOUNTER — Encounter: Payer: Self-pay | Admitting: Gastroenterology

## 2022-10-06 VITALS — BP 110/70 | HR 96 | Ht 60.0 in | Wt 126.1 lb

## 2022-10-06 DIAGNOSIS — Z8719 Personal history of other diseases of the digestive system: Secondary | ICD-10-CM | POA: Diagnosis not present

## 2022-10-06 DIAGNOSIS — R12 Heartburn: Secondary | ICD-10-CM

## 2022-10-06 NOTE — Patient Instructions (Signed)
If your blood pressure at your visit was 140/90 or greater, please contact your primary care physician to follow up on this.  _______________________________________________________  If you are age 72 or older, your body mass index should be between 23-30. Your Body mass index is 24.63 kg/m. If this is out of the aforementioned range listed, please consider follow up with your Primary Care Provider.  If you are age 32 or younger, your body mass index should be between 19-25. Your Body mass index is 24.63 kg/m. If this is out of the aformentioned range listed, please consider follow up with your Primary Care Provider.   ________________________________________________________  The [facility] providers would like to encourage you to use [facility] to communicate with providers for non-urgent requests or questions.  Due to long hold times on the telephone, sending your provider a message by [facility] may be a faster and more efficient way to get a response.  Please allow 48 business hours for a response.  Please remember that this is for non-urgent requests.  _______________________________________________________  Due to recent changes in healthcare laws, you may see the results of your imaging and laboratory studies on MyChart before your provider has had a chance to review them.  We understand that in some cases there may be results that are confusing or concerning to you. Not all laboratory results come back in the same time frame and the provider may be waiting for multiple results in order to interpret others.  Please give [place] 48 hours in order for your provider to thoroughly review all the results before contacting the office for clarification of your results.   It has been recommended to you by your physician that you have a(n) EGD completed. Per your request, we did not schedule the procedure(s) today. Please contact our office at [phone] when you are ready to schedule. You will be scheduled  for a pre-visit and procedure at that time.  Continue Protonix.  It was a pleasure to see you today!  Thank you for trusting me with your gastrointestinal care!

## 2022-10-06 NOTE — Progress Notes (Deleted)
[facility] Progress Note  Chief Complaint: ***  Subjective  History: Initially seen by APP November 2023 for colon cancer screening and reported distant history of upper GI ulcer requiring some kind of surgery according to the patient.  Subsequent Cologuard negative, upper GI series noted below.  May have been having some reflux symptoms but also is complaining of dry mouth. ***  ROS: Cardiovascular:  no chest pain Respiratory: no dyspnea  The patient's Past Medical, Family and Social History were reviewed and are on file in the EMR.  Objective:  Med list reviewed  Current Outpatient Medications:    atorvastatin (LIPITOR) 40 MG tablet, Take 1 tablet (40 mg total) by mouth daily., Disp: 90 tablet, Rfl: 1   diclofenac Sodium (VOLTAREN) 1 % GEL, Apply 4 g topically 4 (four) times daily., Disp: 150 g, Rfl: 2   finasteride (PROSCAR) 5 MG tablet, Take 1 tablet (5 mg total) by mouth daily., Disp: 90 tablet, Rfl: 1   fluticasone (FLONASE) 50 MCG/ACT nasal spray, SPRAY 2 SPRAYS INTO EACH NOSTRIL EVERY DAY, Disp: 48 mL, Rfl: 2   hydrochlorothiazide (HYDRODIURIL) 25 MG tablet, Take 1 tablet (25 mg total) by mouth daily., Disp: 90 tablet, Rfl: 1   levocetirizine (XYZAL) 5 MG tablet, TAKE 1 TABLET BY MOUTH EVERY DAY IN THE EVENING, Disp: 90 tablet, Rfl: 1   loratadine (CLARITIN) 10 MG tablet, Take 1 tablet (10 mg total) by mouth daily., Disp: 30 tablet, Rfl: 11   losartan (COZAAR) 25 MG tablet, Take 2 tablets (50 mg total) by mouth daily., Disp: 180 tablet, Rfl: 0   pantoprazole (PROTONIX) 40 MG tablet, Take 1 tablet (40 mg total) by mouth daily., Disp: 90 tablet, Rfl: 3   tolterodine (DETROL) 2 MG tablet, , Disp: , Rfl:    traMADol (ULTRAM) 50 MG tablet, Take 1 tablet (50 mg total) by mouth every 6 (six) hours as needed (pain)., Disp: 12 tablet, Rfl: 0   Vital signs in last 24 hrs: There were no vitals filed for this visit. Wt Readings from Last 3 Encounters:  09/22/22 131 lb 9.6  oz (59.7 kg)  08/04/22 128 lb 9.6 oz (58.3 kg)  05/23/22 127 lb 9.6 oz (57.9 kg)    Physical Exam  *** HEENT: sclera anicteric, oral mucosa moist without lesions Neck: supple, no thyromegaly, JVD or lymphadenopathy Cardiac: ***,  no peripheral edema Pulm: clear to auscultation bilaterally, normal RR and effort noted Abdomen: soft, *** tenderness, with active bowel sounds. No guarding or palpable hepatosplenomegaly. Skin; warm and dry, no jaundice or rash  Labs:   ___________________________________________ Radiologic studies: CLINICAL DATA:  Patient with history of intermittent dysphagia/indigestion, globus sensation base of neck region, prior gastric surgery in [place] 30 yrs ago for possible ulcer   EXAM: DG UGI W SINGLE CM   TECHNIQUE: Scout radiograph was obtained. Single contrast examination was performed using thin liquid barium. This exam was performed by Lindaann Pascal and was supervised and interpreted by Dr. Zetta Bills.   FLUOROSCOPY: Radiation Exposure Index (as provided by the fluoroscopic device): 27.10 mGy Kerma   COMPARISON:  CT abdomen/pelvis 06/23/21   FINDINGS: Scout Radiograph: No acute bowel findings or acute process on scout radiograph.   Esophagus:  Normal appearance.   Esophageal motility: Minimal tertiary peristalsis but intact primary wave.   Gastroesophageal reflux:  None visualized by provocative maneuvers.   Ingested 63mm barium tablet: Passed normally   Stomach: Normal appearance. No hiatal hernia.   Gastric emptying: Normal.  Duodenum: Query small duodenal diverticulum or distortion of the duodenal bulb with mild asymmetry of the duodenal bulb seen on both barium and incidental air contrast images.   Other:  None   IMPRESSION: Mild esophageal dysmotility with otherwise normal appearance of the esophagus.   Normal single contrast appearance of the stomach.   Question of mild duodenal bulb distortion with a pattern  that raises the possibility of sequela of distortion and scarring from prior ulcer disease or postoperative change. Small diverticulum associated with the duodenal bulb is also possible. Similar appearance noted on previous CT imaging from 2022.     Electronically Signed   By: Zetta Bills M.D.   On: 06/09/2022 11:41    ____________________________________________ Other:   _____________________________________________ Assessment & Plan  Assessment: No diagnosis found.    Plan:   *** minutes were spent on this encounter (including chart review, history/exam, counseling/coordination of care, and documentation) > 50% of that time was spent on counseling and coordination of care.   Nelida Meuse III

## 2022-10-06 NOTE — Progress Notes (Signed)
[facility] Progress Note  Chief Complaint:  Chief Complaint  Patient presents with   Gastroesophageal Reflux   duodenal ulcer   Sore Throat    X days, sore throat,  sinus drainage, stuffiness   Subjective  History: Initially seen by APP November 2023 for colon cancer screening and reported distant history of upper GI ulcer requiring some kind of surgery according to the patient.  Subsequent Cologuard negative, upper GI series noted below.  May have been having some reflux symptoms but also is complaining of dry mouth.  He is accompanied by a translator for today's visit.  He complains of sore throat and stuffy noise that started 2 days ago. He reports that he's been doing more yard work and attributes his symptoms to it. He states that if he doesn't take his pantoprazole 40 mg once daily he would experience sore throat.  He denies any dysphagia, vomiting, abdominal pain, or unexpected weight loss.  He states he's travelling to [place] for the next 3 months.   ROS: Review of Systems  Constitutional:  Negative for appetite change, fever and unexpected weight change.  HENT:  Positive for postnasal drip, sinus pressure and sore throat. Negative for trouble swallowing.   Respiratory:  Negative for cough and shortness of breath.   Cardiovascular:  Negative for chest pain.  Gastrointestinal:  Negative for abdominal distention, abdominal pain, anal bleeding, blood in stool, constipation, diarrhea, nausea, rectal pain and vomiting.       +heartburn   Genitourinary:  Negative for dysuria.  Musculoskeletal:  Negative for back pain.  Skin:  Negative for rash.  Neurological:  Negative for weakness.  All other systems reviewed and are negative.    The patient's Past Medical, Family and Social History were reviewed and are on file in the EMR.  Objective:  Med list reviewed  Current Outpatient Medications:    atorvastatin (LIPITOR) 40 MG tablet, Take 1 tablet (40 mg total)  by mouth daily., Disp: 90 tablet, Rfl: 1   diclofenac Sodium (VOLTAREN) 1 % GEL, Apply 4 g topically 4 (four) times daily., Disp: 150 g, Rfl: 2   finasteride (PROSCAR) 5 MG tablet, Take 1 tablet (5 mg total) by mouth daily., Disp: 90 tablet, Rfl: 1   fluticasone (FLONASE) 50 MCG/ACT nasal spray, SPRAY 2 SPRAYS INTO EACH NOSTRIL EVERY DAY, Disp: 48 mL, Rfl: 2   hydrochlorothiazide (HYDRODIURIL) 25 MG tablet, Take 1 tablet (25 mg total) by mouth daily., Disp: 90 tablet, Rfl: 1   levocetirizine (XYZAL) 5 MG tablet, TAKE 1 TABLET BY MOUTH EVERY DAY IN THE EVENING, Disp: 90 tablet, Rfl: 1   loratadine (CLARITIN) 10 MG tablet, Take 1 tablet (10 mg total) by mouth daily., Disp: 30 tablet, Rfl: 11   losartan (COZAAR) 25 MG tablet, Take 2 tablets (50 mg total) by mouth daily., Disp: 180 tablet, Rfl: 0   pantoprazole (PROTONIX) 40 MG tablet, Take 1 tablet (40 mg total) by mouth daily., Disp: 90 tablet, Rfl: 3   tolterodine (DETROL) 2 MG tablet, , Disp: , Rfl:    traMADol (ULTRAM) 50 MG tablet, Take 1 tablet (50 mg total) by mouth every 6 (six) hours as needed (pain)., Disp: 12 tablet, Rfl: 0   Vital signs in last 24 hrs: There were no vitals filed for this visit. Wt Readings from Last 3 Encounters:  09/22/22 131 lb 9.6 oz (59.7 kg)  08/04/22 128 lb 9.6 oz (58.3 kg)  05/23/22 127 lb 9.6 oz (57.9 kg)  Physical Exam Well-appearing Cardiac: RRR,  no peripheral edema Pulm: clear to auscultation bilaterally, normal RR and effort noted Abdomen: soft, no tenderness, with active bowel sounds. No guarding or palpable hepatosplenomegaly. Midline scar- infraumbilical with no hernia   Labs:   ___________________________________________ Radiologic studies: CLINICAL DATA:  Patient with history of intermittent dysphagia/indigestion, globus sensation base of neck region, prior gastric surgery in [place] 30 yrs ago for possible ulcer   EXAM: DG UGI W SINGLE CM   TECHNIQUE: Scout radiograph was obtained.  Single contrast examination was performed using thin liquid barium. This exam was performed by Lindaann Pascal and was supervised and interpreted by Dr. Zetta Bills.   FLUOROSCOPY: Radiation Exposure Index (as provided by the fluoroscopic device): 27.10 mGy Kerma   COMPARISON:  CT abdomen/pelvis 06/23/21   FINDINGS: Scout Radiograph: No acute bowel findings or acute process on scout radiograph.   Esophagus:  Normal appearance.   Esophageal motility: Minimal tertiary peristalsis but intact primary wave.   Gastroesophageal reflux:  None visualized by provocative maneuvers.   Ingested 39mm barium tablet: Passed normally   Stomach: Normal appearance. No hiatal hernia.   Gastric emptying: Normal.   Duodenum: Query small duodenal diverticulum or distortion of the duodenal bulb with mild asymmetry of the duodenal bulb seen on both barium and incidental air contrast images.   Other:  None   IMPRESSION: Mild esophageal dysmotility with otherwise normal appearance of the esophagus.   Normal single contrast appearance of the stomach.   Question of mild duodenal bulb distortion with a pattern that raises the possibility of sequela of distortion and scarring from prior ulcer disease or postoperative change. Small diverticulum associated with the duodenal bulb is also possible. Similar appearance noted on previous CT imaging from 2022.     Electronically Signed   By: Zetta Bills M.D.   On: 06/09/2022 11:41   (Images personally reviewed) ____________________________________________ Other:   _____________________________________________ Assessment & Plan  Assessment: History of duodenal ulcer  Heartburn  History of duodenal ulcer, surgical procedures done without apparent resection upper GI series.  Probably patch repair.  Unknown if he has been tested or treated for H. pylori.  Intermittent heartburn.  He also has some throat discomfort with what sounds like  allergic congestion and postnasal drip contributing to that.  He is on some allergy medicines and nasal spray, and can revisit that issue with primary care.  Plan: -schedule an EGD to see if he has an active duodenal ulcer versus postsurgical changes that we are seeing on the UGI S.  Also biopsy for H. pylori.  He decided to contact us and schedule this after his return from [place] in about 3 months.  Based on those findings we will adjust medication afterwards  -keep patient on current medication regimen and will discuss starting Pepcid/H2 Blockers upon his return form his travels.     Wilfrid Lund, MD    Velora Heckler Lanette Hampshire, [place], MD, have reviewed all documentation for this visit. The documentation on 10/06/22 for the exam, diagnosis, procedures, and orders are all accurate and complete.   I,Safa M Kadhim,acting as a scribe for [place], MD.,have documented all relevant documentation on the behalf of Doran Stabler, MD,as directed by  Nelida Meuse III, MD while in the presence of [place], MD.   30 minutes were spent on this encounter (including chart review, history/exam, counseling/coordination of care, and documentation) > 50% of that time was  spent on counseling and coordination of care.  (Additional time required for use of interpreter)

## 2022-12-28 ENCOUNTER — Ambulatory Visit (INDEPENDENT_AMBULATORY_CARE_PROVIDER_SITE_OTHER): Payer: 59 | Admitting: *Deleted

## 2022-12-28 DIAGNOSIS — Z0000 Encounter for general adult medical examination without abnormal findings: Secondary | ICD-10-CM

## 2022-12-28 NOTE — Patient Instructions (Signed)
[name] , Thank you for taking time to come for your Medicare Wellness Visit. I appreciate your ongoing commitment to your health goals. Please review the following plan we discussed and let me know if I can assist you in the future.   These are the goals we discussed:  Goals   None     This is a list of the screening recommended for you and due dates:  Health Maintenance  Topic Date Due   Colon Cancer Screening  Never done   Zoster (Shingles) Vaccine (2 of 2) 06/04/2018   COVID-19 Vaccine (5 - 2023-24 season) 03/11/2022   Flu Shot  02/09/2023   DTaP/Tdap/Td vaccine (2 - Td or Tdap) 04/18/2023   Medicare Annual Wellness Visit  12/28/2023   Pneumonia Vaccine  Completed   Hepatitis C Screening  Completed   HPV Vaccine  Aged Out     Next appointment: Follow up in one year for your annual wellness visit.   Preventive Care 23 Years and Older, Male Preventive care refers to lifestyle choices and visits with your health care provider that can promote health and wellness. What does preventive care include? A yearly physical exam. This is also called an annual well check. Dental exams once or twice a year. Routine eye exams. Ask your health care provider how often you should have your eyes checked. Personal lifestyle choices, including: Daily care of your teeth and gums. Regular physical activity. Eating a healthy diet. Avoiding tobacco and drug use. Limiting alcohol use. Practicing safe sex. Taking low doses of aspirin every day. Taking vitamin and mineral supplements as recommended by your health care provider. What happens during an annual well check? The services and screenings done by your health care provider during your annual well check will depend on your age, overall health, lifestyle risk factors, and family history of disease. Counseling  Your health care provider may ask you questions about your: Alcohol use. Tobacco use. Drug use. Emotional well-being. Home and  relationship well-being. Sexual activity. Eating habits. History of falls. Memory and ability to understand (cognition). Work and work Astronomer. Screening  You may have the following tests or measurements: Height, weight, and BMI. Blood pressure. Lipid and cholesterol levels. These may be checked every 5 years, or more frequently if you are over 60 years old. Skin check. Lung cancer screening. You may have this screening every year starting at age 43 if you have a 30-pack-year history of smoking and currently smoke or have quit within the past 15 years. Fecal occult blood test (FOBT) of the stool. You may have this test every year starting at age 70. Flexible sigmoidoscopy or colonoscopy. You may have a sigmoidoscopy every 5 years or a colonoscopy every 10 years starting at age 67. Prostate cancer screening. Recommendations will vary depending on your family history and other risks. Hepatitis C blood test. Hepatitis B blood test. Sexually transmitted disease (STD) testing. Diabetes screening. This is done by checking your blood sugar (glucose) after you have not eaten for a while (fasting). You may have this done every 1-3 years. Abdominal aortic aneurysm (AAA) screening. You may need this if you are a current or former smoker. Osteoporosis. You may be screened starting at age 65 if you are at high risk. Talk with your health care provider about your test results, treatment options, and if necessary, the need for more tests. Vaccines  Your health care provider may recommend certain vaccines, such as: Influenza vaccine. This is recommended every year.  Tetanus, diphtheria, and acellular pertussis (Tdap, Td) vaccine. You may need a Td booster every 10 years. Zoster vaccine. You may need this after age 29. Pneumococcal 13-valent conjugate (PCV13) vaccine. One dose is recommended after age 87. Pneumococcal polysaccharide (PPSV23) vaccine. One dose is recommended after age 14. Talk to your  health care provider about which screenings and vaccines you need and how often you need them. This information is not intended to replace advice given to you by your health care provider. Make sure you discuss any questions you have with your health care provider. Document Released: 07/24/2015 Document Revised: 03/16/2016 Document Reviewed: 04/28/2015 Elsevier Interactive Patient Education  2017 [employer] Prevention in the Home Falls can cause injuries. They can happen to people of all ages. There are many things you can do to make your home safe and to help prevent falls. What can I do on the outside of my home? Regularly fix the edges of walkways and driveways and fix any cracks. Remove anything that might make you trip as you walk through a door, such as a raised step or threshold. Trim any bushes or trees on the path to your home. Use bright outdoor lighting. Clear any walking paths of anything that might make someone trip, such as rocks or tools. Regularly check to see if handrails are loose or broken. Make sure that both sides of any steps have handrails. Any raised decks and porches should have guardrails on the edges. Have any leaves, snow, or ice cleared regularly. Use sand or salt on walking paths during winter. Clean up any spills in your garage right away. This includes oil or grease spills. What can I do in the bathroom? Use night lights. Install grab bars by the toilet and in the tub and shower. Do not use towel bars as grab bars. Use non-skid mats or decals in the tub or shower. If you need to sit down in the shower, use a plastic, non-slip stool. Keep the floor dry. Clean up any water that spills on the floor as soon as it happens. Remove soap buildup in the tub or shower regularly. Attach bath mats securely with double-sided non-slip rug tape. Do not have throw rugs and other things on the floor that can make you trip. What can I do in the bedroom? Use night  lights. Make sure that you have a light by your bed that is easy to reach. Do not use any sheets or blankets that are too big for your bed. They should not hang down onto the floor. Have a firm chair that has side arms. You can use this for support while you get dressed. Do not have throw rugs and other things on the floor that can make you trip. What can I do in the kitchen? Clean up any spills right away. Avoid walking on wet floors. Keep items that you use a lot in easy-to-reach places. If you need to reach something above you, use a strong step stool that has a grab bar. Keep electrical cords out of the way. Do not use floor polish or wax that makes floors slippery. If you must use wax, use non-skid floor wax. Do not have throw rugs and other things on the floor that can make you trip. What can I do with my stairs? Do not leave any items on the stairs. Make sure that there are handrails on both sides of the stairs and use them. Fix handrails that are broken or loose.  Make sure that handrails are as long as the stairways. Check any carpeting to make sure that it is firmly attached to the stairs. Fix any carpet that is loose or worn. Avoid having throw rugs at the top or bottom of the stairs. If you do have throw rugs, attach them to the floor with carpet tape. Make sure that you have a light switch at the top of the stairs and the bottom of the stairs. If you do not have them, ask someone to add them for you. What else can I do to help prevent falls? Wear shoes that: Do not have high heels. Have rubber bottoms. Are comfortable and fit you well. Are closed at the toe. Do not wear sandals. If you use a stepladder: Make sure that it is fully opened. Do not climb a closed stepladder. Make sure that both sides of the stepladder are locked into place. Ask someone to hold it for you, if possible. Clearly mark and make sure that you can see: Any grab bars or handrails. First and last  steps. Where the edge of each step is. Use tools that help you move around (mobility aids) if they are needed. These include: Canes. Walkers. Scooters. Crutches. Turn on the lights when you go into a dark area. Replace any light bulbs as soon as they burn out. Set up your furniture so you have a clear path. Avoid moving your furniture around. If any of your floors are uneven, fix them. If there are any pets around you, be aware of where they are. Review your medicines with your doctor. Some medicines can make you feel dizzy. This can increase your chance of falling. Ask your doctor what other things that you can do to help prevent falls. This information is not intended to replace advice given to you by your health care provider. Make sure you discuss any questions you have with your health care provider. Document Released: 04/23/2009 Document Revised: 12/03/2015 Document Reviewed: 08/01/2014 Elsevier Interactive Patient Education  2017 [employer].

## 2022-12-28 NOTE — Progress Notes (Signed)
Subjective:   Lance Oneal is a 72 y.o. male who presents for Medicare Annual/Subsequent preventive examination.  Visit Complete: Virtual  I connected with  Lance Oneal on 12/28/22 by a audio enabled telemedicine application and verified that I am speaking with the correct person using two identifiers.  Patient Location: Home  Provider Location: Office/Clinic  I discussed the limitations of evaluation and management by telemedicine. The patient expressed understanding and agreed to proceed.   Review of Systems     Cardiac Risk Factors include: male gender;advanced age (>24men, >49 women);dyslipidemia;hypertension     Objective:    There were no vitals filed for this visit. There is no height or weight on file to calculate BMI.     12/28/2022    9:28 AM 10/26/2021    8:42 AM 07/30/2021   11:44 AM  Advanced Directives  Does Patient Have a Medical Advance Directive? No No No  Would patient like information on creating a medical advance directive? No - Patient declined No - Patient declined     Current Medications (verified) Outpatient Encounter Medications as of 12/28/2022  Medication Sig   atorvastatin (LIPITOR) 40 MG tablet Take 1 tablet (40 mg total) by mouth daily.   diclofenac Sodium (VOLTAREN) 1 % GEL Apply 4 g topically 4 (four) times daily.   finasteride (PROSCAR) 5 MG tablet Take 1 tablet (5 mg total) by mouth daily.   fluticasone (FLONASE) 50 MCG/ACT nasal spray SPRAY 2 SPRAYS INTO EACH NOSTRIL EVERY DAY   hydrochlorothiazide (HYDRODIURIL) 25 MG tablet Take 1 tablet (25 mg total) by mouth daily.   levocetirizine (XYZAL) 5 MG tablet TAKE 1 TABLET BY MOUTH EVERY DAY IN THE EVENING   loratadine (CLARITIN) 10 MG tablet Take 1 tablet (10 mg total) by mouth daily.   losartan (COZAAR) 25 MG tablet Take 2 tablets (50 mg total) by mouth daily.   Multiple Vitamin (MULTI-VITAMIN) tablet Take 1 tablet by mouth daily.   pantoprazole (PROTONIX) 40 MG tablet Take 1 tablet (40 mg total) by  mouth daily.   tolterodine (DETROL) 2 MG tablet    traMADol (ULTRAM) 50 MG tablet Take 1 tablet (50 mg total) by mouth every 6 (six) hours as needed (pain).   No facility-administered encounter medications on file as of 12/28/2022.    Allergies (verified) Lisinopril   History: Past Medical History:  Diagnosis Date   BPH (benign prostatic hyperplasia)    GERD (gastroesophageal reflux disease)    History of gastric ulcer    per pt 1970s or 1980s s/p  open surgery for stomach ulcer   Hyperlipidemia    Hypertension    Right hydrocele    Urinary urgency    refractory   Past Surgical History:  Procedure Laterality Date   ABDOMINAL SURGERY     per pt done approxl 1970s or 1980s  for stomach ulcer (has large midline incision)   HYDROCELE EXCISION Right 07/30/2021   Procedure: HYDROCELECTOMY ADULT AND RIGHT ORCHIOPEXY;  Surgeon: Sebastian Ache, MD;  Location: [facility];  Service: Urology;  Laterality: Right;   TRANSURETHRAL RESECTION OF PROSTATE N/A 07/30/2021   Procedure: TRANSURETHRAL RESECTION OF THE PROSTATE (TURP);  Surgeon: Sebastian Ache, MD;  Location: [facility];  Service: Urology;  Laterality: N/A;   Family History  Problem Relation Age of Onset   Hypertension Mother    Liver cancer Father    Liver cancer Other    Colon cancer Neg Hx    Pancreatic cancer Neg Hx  Stomach cancer Neg Hx    Social History   Socioeconomic History   Marital status: Married    Spouse name: Not on file   Number of children: 1   Years of education: Not on file   Highest education level: Not on file  Occupational History   Occupation: retired  Tobacco Use   Smoking status: Former    Packs/day: .5    Types: Cigarettes    Quit date: 02/26/2005    Years since quitting: 17.8   Smokeless tobacco: Never  Substance and Sexual Activity   Alcohol use: Yes   Drug use: Never   Sexual activity: Yes    Partners: Female  Other Topics Concern   Not on file   Social History Narrative   Not on file   Social Determinants of Health   Financial Resource Strain: Low Risk  (10/26/2021)   Overall Financial Resource Strain (CARDIA)    Difficulty of Paying Living Expenses: Not hard at all  Food Insecurity: No Food Insecurity (12/28/2022)   Hunger Vital Sign    Worried About Running Out of Food in the Last Year: Never true    Ran Out of Food in the Last Year: Never true  Transportation Needs: No Transportation Needs (12/28/2022)   PRAPARE - Administrator, Civil Service (Medical): No    Lack of Transportation (Non-Medical): No  Physical Activity: Sufficiently Active (12/28/2022)   Exercise Vital Sign    Days of Exercise per Week: 7 days    Minutes of Exercise per Session: 150+ min  Stress: No Stress Concern Present (10/26/2021)   [employer] of Occupational Health - Occupational Stress Questionnaire    Feeling of Stress : Not at all  Social Connections: Moderately Integrated (10/26/2021)   Social Connection and Isolation Panel [NHANES]    Frequency of Communication with Friends and Family: More than three times a week    Frequency of Social Gatherings with Friends and Family: More than three times a week    Attends Religious Services: Never    Database administrator or Organizations: Yes    Attends Banker Meetings: Never    Marital Status: Married    Tobacco Counseling Counseling given: Not Answered   Clinical Intake:  Pre-visit preparation completed: Yes  Pain : No/denies pain  Nutritional Risks: None Diabetes: No  How often do you need to have someone help you when you read instructions, pamphlets, or other written materials from your doctor or pharmacy?: 3 - Sometimes  Interpreter Needed?: No  Information entered by :: [employer], CMA   Activities of Daily Living    12/28/2022    9:32 AM  In your present state of health, do you have any difficulty performing the following activities:  Hearing?  0  Vision? 1  Comment has cataracts  Difficulty concentrating or making decisions? 0  Walking or climbing stairs? 0  Dressing or bathing? 0  Doing errands, shopping? 0  Preparing Food and eating ? N  Using the Toilet? N  In the past six months, have you accidently leaked urine? N  Do you have problems with loss of bowel control? N  Managing your Medications? N  Managing your Finances? N  Housekeeping or managing your Housekeeping? N    Patient Care Team: Zola Button, Grayling Congress, DO as PCP - General (Family Medicine) Sinda Du, MD as Consulting Physician (Ophthalmology) Berneice Heinrich Delbert Phenix., MD as Consulting Physician (Urology)  Indicate any recent Medical Services  you may have received from other than [facility] providers in the past year (date may be approximate).     Assessment:   This is a routine wellness examination for Lance Oneal.  Hearing/Vision screen No results found.  Dietary issues and exercise activities discussed:     Goals Addressed   None    Depression Screen    12/28/2022    9:32 AM 09/22/2022   11:01 AM 10/26/2021    8:45 AM 10/26/2021    8:42 AM 11/30/2020    2:36 PM 02/26/2013    8:29 AM  PHQ 2/9 Scores  PHQ - 2 Score 0 0 0 0 0 0    Fall Risk    12/28/2022    9:29 AM 09/22/2022   11:01 AM 10/26/2021    8:42 AM 06/07/2021    9:58 AM  Fall Risk   Falls in the past year? 0 0 0 0  Number falls in past yr: 0 0 0 0  Injury with Fall? 0 0 0 0  Risk for fall due to : No Fall Risks  No Fall Risks   Follow up Falls evaluation completed Falls evaluation completed Falls evaluation completed Falls evaluation completed    MEDICARE RISK AT HOME:  Medicare Risk at Home - 12/28/22 0932     Any stairs in or around the home? Yes    If so, are there any without handrails? No    Home free of loose throw rugs in walkways, pet beds, electrical cords, etc? Yes    Adequate lighting in your home to reduce risk of falls? Yes    Life alert? No    Use of a cane, walker or  w/c? No    Grab bars in the bathroom? No    Shower chair or bench in shower? Yes    Elevated toilet seat or a handicapped toilet? No             TIMED UP AND GO:  Was the test performed?  No    Cognitive Function:    12/28/2022    9:30 AM  MMSE - Mini Mental State Exam  Not completed: Unable to complete        10/26/2021    8:48 AM  6CIT Screen  What Year? 0 points  What month? 0 points  What time? 0 points  Count back from 20 0 points  Months in reverse 0 points  Repeat phrase 0 points  Total Score 0 points    Immunizations Immunization History  Administered Date(s) Administered   Influenza, High Dose Seasonal PF 03/07/2018, 02/25/2019, 04/06/2020, 04/05/2021, 04/26/2022   Influenza,inj,Quad PF,6+ Mos 04/07/2013, 03/05/2014, 03/03/2016   Influenza-Unspecified 04/07/2013, 03/05/2014, 03/03/2016, 05/01/2017, 03/07/2018, 02/25/2019   Moderna Covid-19 Vaccine Bivalent Booster 69yrs & up 06/07/2021   Moderna Sars-Covid-2 Vaccination 09/18/2019, 10/16/2019, 06/01/2020   Pneumococcal Conjugate-13 09/06/2016   Pneumococcal Polysaccharide-23 04/09/2018   Tdap 04/17/2013   Zoster Recombinat (Shingrix) 04/09/2018   Zoster, Live 04/17/2013   Zoster, Unspecified 04/09/2018    TDAP status: Up to date  Flu Vaccine status: Up to date  Pneumococcal vaccine status: Up to date  Covid-19 vaccine status: Information provided on how to obtain vaccines.   Qualifies for Shingles Vaccine? Yes   Zostavax completed Yes   Shingrix Completed?: No.    Education has been provided regarding the importance of this vaccine. Patient has been advised to call insurance company to determine out of pocket expense if they have not yet received this vaccine. Advised  may also receive vaccine at local pharmacy or Health Dept. Verbalized acceptance and understanding.  Screening Tests Health Maintenance  Topic Date Due   Colonoscopy  Never done   Zoster Vaccines- Shingrix (2 of 2) 06/04/2018    COVID-19 Vaccine (5 - 2023-24 season) 03/11/2022   Medicare Annual Wellness (AWV)  10/27/2022   INFLUENZA VACCINE  02/09/2023   DTaP/Tdap/Td (2 - Td or Tdap) 04/18/2023   Pneumonia Vaccine 41+ Years old  Completed   Hepatitis C Screening  Completed   HPV VACCINES  Aged Out    Health Maintenance  Health Maintenance Due  Topic Date Due   Colonoscopy  Never done   Zoster Vaccines- Shingrix (2 of 2) 06/04/2018   COVID-19 Vaccine (5 - 2023-24 season) 03/11/2022   Medicare Annual Wellness (AWV)  10/27/2022    Colorectal cancer screening: Referral to GI placed 01/13/22. Pt aware the office will call re: appt.  Lung Cancer Screening: (Low Dose CT Chest recommended if Age 37-80 years, 20 pack-year currently smoking OR have quit w/in 15years.) does not qualify.    Additional Screening:  Hepatitis C Screening: does qualify; Completed 08/27/15  Vision Screening: Recommended annual ophthalmology exams for early detection of glaucoma and other disorders of the eye. Is the patient up to date with their annual eye exam?  Yes  Who is the provider or what is the name of the office in which the patient attends annual eye exams? Doesn't remember name at this time If pt is not established with a provider, would they like to be referred to a provider to establish care? No .   Dental Screening: Recommended annual dental exams for proper oral hygiene   Community Resource Referral / Chronic Care Management: CRR required this visit?  No   CCM required this visit?  No     Plan:     I have personally reviewed and noted the following in the patient's chart:   Medical and social history Use of alcohol, tobacco or illicit drugs  Current medications and supplements including opioid prescriptions. Patient is not currently taking opioid prescriptions. Functional ability and status Nutritional status Physical activity Advanced directives List of other physicians Hospitalizations, surgeries, and ER  visits in previous 12 months Vitals Screenings to include cognitive, depression, and falls Referrals and appointments  In addition, I have reviewed and discussed with patient certain preventive protocols, quality metrics, and best practice recommendations. A written personalized care plan for preventive services as well as general preventive health recommendations were provided to patient.     Donne Anon, CMA   12/28/2022   After Visit Summary: (MyChart) Due to this being a telephonic visit, the after visit summary with patients personalized plan was offered to patient via MyChart   Nurse Notes: None

## 2023-01-06 DIAGNOSIS — H353132 Nonexudative age-related macular degeneration, bilateral, intermediate dry stage: Secondary | ICD-10-CM | POA: Diagnosis not present

## 2023-01-19 DIAGNOSIS — H2511 Age-related nuclear cataract, right eye: Secondary | ICD-10-CM | POA: Diagnosis not present

## 2023-01-27 ENCOUNTER — Ambulatory Visit (INDEPENDENT_AMBULATORY_CARE_PROVIDER_SITE_OTHER): Payer: 59

## 2023-01-27 ENCOUNTER — Ambulatory Visit
Admission: EM | Admit: 2023-01-27 | Discharge: 2023-01-27 | Disposition: A | Discharge status: Home / Self Care | Payer: 59 | Attending: Physician Assistant | Admitting: Physician Assistant

## 2023-01-27 DIAGNOSIS — M25571 Pain in right ankle and joints of right foot: Secondary | ICD-10-CM

## 2023-01-27 DIAGNOSIS — S93401A Sprain of unspecified ligament of right ankle, initial encounter: Secondary | ICD-10-CM | POA: Diagnosis not present

## 2023-01-27 DIAGNOSIS — L255 Unspecified contact dermatitis due to plants, except food: Secondary | ICD-10-CM

## 2023-01-27 DIAGNOSIS — M25561 Pain in right knee: Secondary | ICD-10-CM | POA: Diagnosis not present

## 2023-01-27 DIAGNOSIS — S8001XA Contusion of right knee, initial encounter: Secondary | ICD-10-CM | POA: Diagnosis not present

## 2023-01-27 MED ORDER — PREDNISONE 10 MG PO TABS: ORAL_TABLET | ORAL | 0 refills | Status: DC

## 2023-01-27 MED ORDER — TRIAMCINOLONE ACETONIDE 0.1 % EX CREA: 1.0000 | TOPICAL_CREAM | Freq: Two times a day (BID) | CUTANEOUS | 1 refills | Status: AC

## 2023-01-27 NOTE — Discharge Instructions (Signed)
Your x-rays were normal with no evidence of broken bones.  Use the brace for comfort and support.  Keep your knee and ankle elevated.  Use ice.  Follow-up with orthopedics.  Call them to schedule an appointment.  Start prednisone as prescribed.  Do not take NSAIDs with this medication including aspirin, ibuprofen/Advil, naproxen/Aleve.  You can use acetaminophen/Tylenol for breakthrough pain.  If anything worsens return for reevaluation.  I believe your rash is related to an allergic reaction to the plant.  The prednisone should help with this.  You can also apply triamcinolone cream.  Do not scratch it.  If there is any sign of infection including redness, swelling, bleeding, drainage, fever you should be reevaluated.  K?t qu? ch?p X-quang c?a b?n bnh th??ng v khng c b?ng ch?ng gy x??ng.  S? d?ng n?p ?? t?o s? tho?i mi v h? tr?.  Gi? ??u g?i v m?t c chn c?a b?n nng cao.  S? d?ng n??c ?.  Theo di ch?nh hnh.  G?i cho h? ?? s?p x?p m?t cu?c h?n.  B?t ??u dng prednisone theo quy ??nh.  Khng dng NSAID v?i thu?c ny bao g?m aspirin, ibuprofen/Advil, naproxen/Aleve.  B?n c th? s? d?ng acetaminophen/Tylenol ?? gi?m c?n ?au ??t ng?t.  N?u c ?i?u g x?u ?i hy quay l?i ?? ?nh gi l?i.  Ti tin r?ng pht ban c?a b?n c lin quan ??n ph?n ?ng d? ?ng v?i cy.  Prednisone s? gip gi?i quy?t v?n ?? ny.  B?n c?ng c th? bi kem triamcinolone.  ??ng gi n.  N?u c b?t k? d?u hi?u nhi?m trng no bao g?m ??, s?ng, ch?y mu, ch?y n??c, s?t th b?n nn ???c ?nh gi l?i.

## 2023-01-27 NOTE — ED Provider Notes (Signed)
[facility]    CSN: [id number] Arrival date & time: 01/27/23  1252      History   Chief Complaint Chief Complaint  Patient presents with   Leg Pain   Knee Pain    HPI Lance Oneal is a 72 y.o. male.   Patient presents today with several concerns. He is vietnamese speaking and video interpreter was used during this visit.  He reports that approximately 1 to 1.5 weeks ago he was wearing socks on the stairs when he took a misstep falling onto his right leg and injuring the inside portion of his knee and ankle.  He has had ongoing pain since that time as well as swelling in the knee/ankle/lower leg.  Pain is worse with attempted ambulation or movement of these joints.  He denies previous injury or surgery involving his knee or ankle.  He has tried Tylenol without improvement of symptoms.  He denies any popping, clicking, instability.  He is able to ascend and descend stairs that is difficult as result of the pain.  Pain is rated 8 on a 0-10 pain scale, described as throbbing, worse with movement, no alleviating factors identified.  In addition, patient reports a weeklong history of pruritic and vesicular rash on his hands and upper extremities.  Reports this occurred after he was working in the yard.  Denies any additional exposures to plants, insects, animals.  He has not tried any over-the-counter medication for symptom management.  He denies history of diabetes.  Denies any recent steroid use.  Denies history of dermatological condition.  Denies any swelling with throat, shortness of breath, muffled voice.    Past Medical History:  Diagnosis Date   BPH (benign prostatic hyperplasia)    GERD (gastroesophageal reflux disease)    History of gastric ulcer    per pt 1970s or 1980s s/p  open surgery for stomach ulcer   Hyperlipidemia    Hypertension    Right hydrocele    Urinary urgency    refractory    Patient Active Problem List   Diagnosis Date Noted   Bronchitis 04/07/2022    Prostatic hyperplasia 07/30/2021   Dyspepsia 04/09/2018   Seasonal allergic rhinitis due to pollen 04/09/2018   Benign prostatic hyperplasia (BPH) with urinary urgency 02/27/2017   Pharyngitis 09/26/2016   Cough 09/06/2016   Atherosclerosis of aorta (HCC) 09/06/2016   Preventative health care 08/30/2015   History of BPH 02/10/2015   Tooth pain 02/10/2015   Hyperlipidemia 06/07/2013   Urinary frequency 04/17/2013   Pain in left testicle 04/17/2013   Essential hypertension 01/01/2013    Past Surgical History:  Procedure Laterality Date   ABDOMINAL SURGERY     per pt done approxl 1970s or 1980s  for stomach ulcer (has large midline incision)   HYDROCELE EXCISION Right 07/30/2021   Procedure: HYDROCELECTOMY ADULT AND RIGHT ORCHIOPEXY;  Surgeon: Sebastian Ache, MD;  Location: [facility];  Service: Urology;  Laterality: Right;   TRANSURETHRAL RESECTION OF PROSTATE N/A 07/30/2021   Procedure: TRANSURETHRAL RESECTION OF THE PROSTATE (TURP);  Surgeon: Sebastian Ache, MD;  Location: [facility];  Service: Urology;  Laterality: N/A;       Home Medications    Prior to Admission medications   Medication Sig Start Date End Date Taking? Authorizing Provider  predniSONE (DELTASONE) 10 MG tablet Take 40 mg (4 tablets) for day 1 and 2, take 30 mg (3 tablets) for days 3 and 4, take 20 mg (2 tablets) for days  5 and 6, take 10 mg (1 tablet) days 7 and 8 then stop. 01/27/23  Yes Marbeth Smedley K, PA-C  triamcinolone cream (KENALOG) 0.1 % Apply 1 Application topically 2 (two) times daily. 01/27/23  Yes Ronald Vinsant K, PA-C  atorvastatin (LIPITOR) 40 MG tablet Take 1 tablet (40 mg total) by mouth daily. 09/22/22   Donato Schultz, DO  diclofenac Sodium (VOLTAREN) 1 % GEL Apply 4 g topically 4 (four) times daily. 01/13/22   Donato Schultz, DO  finasteride (PROSCAR) 5 MG tablet Take 1 tablet (5 mg total) by mouth daily. 09/22/22   Zola Button, Myrene Buddy R, DO   fluticasone (FLONASE) 50 MCG/ACT nasal spray SPRAY 2 SPRAYS INTO EACH NOSTRIL EVERY DAY 08/12/21   Zola Button, Grayling Congress, DO  hydrochlorothiazide (HYDRODIURIL) 25 MG tablet Take 1 tablet (25 mg total) by mouth daily. 09/22/22   Donato Schultz, DO  levocetirizine (XYZAL) 5 MG tablet TAKE 1 TABLET BY MOUTH EVERY DAY IN THE EVENING 08/09/22   Zola Button, Grayling Congress, DO  loratadine (CLARITIN) 10 MG tablet Take 1 tablet (10 mg total) by mouth daily. 04/07/22   Donato Schultz, DO  losartan (COZAAR) 25 MG tablet Take 2 tablets (50 mg total) by mouth daily. 09/22/22   Donato Schultz, DO  Multiple Vitamin (MULTI-VITAMIN) tablet Take 1 tablet by mouth daily. 11/29/19   [provider]  pantoprazole (PROTONIX) 40 MG tablet Take 1 tablet (40 mg total) by mouth daily. 08/04/22   Unk Lightning, PA  tolterodine (DETROL) 2 MG tablet  04/05/22   [provider]  traMADol (ULTRAM) 50 MG tablet Take 1 tablet (50 mg total) by mouth every 6 (six) hours as needed (pain). 04/11/22   Zenia Resides, MD    Family History Family History  Problem Relation Age of Onset   Hypertension Mother    Liver cancer Father    Liver cancer Other    Colon cancer Neg Hx    Pancreatic cancer Neg Hx    Stomach cancer Neg Hx     Social History Social History   Tobacco Use   Smoking status: Former    Current packs/day: 0.00    Types: Cigarettes    Quit date: 02/26/2005    Years since quitting: 17.9   Smokeless tobacco: Never  Vaping Use   Vaping status: Never Used  Substance Use Topics   Alcohol use: Yes   Drug use: Never     Allergies   Lisinopril   Review of Systems Review of Systems  Constitutional:  Positive for activity change. Negative for appetite change, fatigue and fever.  Respiratory:  Negative for cough and shortness of breath.   Cardiovascular:  Positive for leg swelling. Negative for chest pain and palpitations.  Musculoskeletal:  Positive for arthralgias,  gait problem and joint swelling. Negative for myalgias.  Skin:  Positive for rash.  Neurological:  Negative for weakness and numbness.     Physical Exam Triage Vital Signs ED Triage Vitals  Encounter Vitals Group     BP 01/27/23 1438 121/75     Systolic BP Percentile --      Diastolic BP Percentile --      Pulse Rate 01/27/23 1438 96     Resp 01/27/23 1438 18     Temp 01/27/23 1438 99.3 F (37.4 C)     Temp Source 01/27/23 1438 Oral     SpO2 01/27/23 1438 96 %  Weight --      Height --      Head Circumference --      Peak Flow --      Pain Score 01/27/23 1436 7     Pain Loc --      Pain Education --      Exclude from Growth Chart --    No data found.  Updated Vital Signs BP 121/75 (BP Location: Right Arm)   Pulse 96   Temp 99.3 F (37.4 C) (Oral)   Resp 18   SpO2 96%   Visual Acuity Right Eye Distance:   Left Eye Distance:   Bilateral Distance:    Right Eye Near:   Left Eye Near:    Bilateral Near:     Physical Exam Vitals reviewed.  Constitutional:      General: He is awake.     Appearance: Normal appearance. He is well-developed. He is not ill-appearing.     Comments: Very pleasant male appears stated age in no acute distress sitting comfortably in exam room  HENT:     Head: Normocephalic and atraumatic.     Mouth/Throat:     Pharynx: No oropharyngeal exudate, posterior oropharyngeal erythema or uvula swelling.  Cardiovascular:     Rate and Rhythm: Normal rate and regular rhythm.     Heart sounds: Normal heart sounds, S1 normal and S2 normal. No murmur heard. Pulmonary:     Effort: Pulmonary effort is normal.     Breath sounds: Normal breath sounds. No stridor. No wheezing, rhonchi or rales.     Comments: Clear to auscultation bilaterally Musculoskeletal:     Right knee: No swelling. Decreased range of motion. Tenderness present over the medial joint line. No lateral joint line tenderness. No LCL laxity, MCL laxity, ACL laxity or PCL laxity.      Right ankle: No swelling. Tenderness present over the medial malleolus. No lateral malleolus tenderness. Normal range of motion.     Comments: Right knee: Significant tenderness palpation over medial right knee.  No deformity noted.  Decreased flexion secondary to pain.  Swelling throughout lower calf and ankle.  Negative Homans' sign.  No ligamentous laxity on exam.  Right ankle: Tenderness palpation over medial malleoli.  Swelling in foot and ankle.  Foot neurovascularly intact.  No deformity noted.  Skin:    Findings: Rash present. Rash is vesicular.     Comments: Multiple erythematous lesions noted forearm and fingers with fine vesicles noted.  Evidence of excoriation.  Neurological:     Mental Status: He is alert.  Psychiatric:        Behavior: Behavior is cooperative.      UC Treatments / Results  Labs (all labs ordered are listed, but only abnormal results are displayed) Labs Reviewed - No data to display  EKG   Radiology DG Ankle Complete Right  Result Date: 01/27/2023 CLINICAL DATA:  Right ankle pain after fall last week. EXAM: RIGHT ANKLE - COMPLETE 3+ VIEW COMPARISON:  None Available. FINDINGS: There is no evidence of fracture, dislocation, or joint effusion. There is no evidence of arthropathy or other focal bone abnormality. Soft tissues are unremarkable. IMPRESSION: Negative. Electronically Signed   By: Lupita Raider M.D.   On: 01/27/2023 15:21   DG Knee Complete 4 Views Right  Result Date: 01/27/2023 CLINICAL DATA:  Pain after fall 1 week ago EXAM: RIGHT KNEE - COMPLETE 4+ VIEW COMPARISON:  None Available. FINDINGS: No acute fracture or dislocation. No significant joint effusion.  No evidence of arthropathy or other focal bone abnormality. Soft tissues are unremarkable. IMPRESSION: Negative. Electronically Signed   By: Darliss Cheney M.D.   On: 01/27/2023 15:20    Procedures Procedures (including critical care time)  Medications Ordered in UC Medications - No data  to display  Initial Impression / Assessment and Plan / UC Course  I have reviewed the triage vital signs and the nursing notes.  Pertinent labs & imaging results that were available during my care of the patient were reviewed by me and considered in my medical decision making (see chart for details).     X-rays of knee and ankle obtained given bony tenderness following recent injury which showed no acute osseous abnormalities.  Suspect contusion/sprain as etiology of symptoms.  Patient also has dermatitis so will use prednisone to cover both conditions.  He was instructed not to take NSAIDs with this medication but can use Tylenol for breakthrough pain.  He was encouraged to use RICE protocol and given a brace for comfort and support.  Recommend close follow-up with orthopedics and was given contact information for local provider with instruction to call to schedule appointment.  If he has any worsening symptoms he needs to be seen immediately.  Discussed that prednisone prescribed for musculoskeletal injury should also help with rash.  Discussed that he is not to take NSAIDs with this medication.  He can use triamcinolone cream for additional pain relief.  Discussed that if he has any worsening symptoms including spread of rash, swelling of his throat, shortness of breath, signs of infection he should be seen emergently.  Strict return precautions given.  Recommend close follow-up with his primary care to which he expressed understanding.  Final Clinical Impressions(s) / UC Diagnoses   Final diagnoses:  Contusion of right knee, initial encounter  Sprain of right ankle, unspecified ligament, initial encounter  Rhus dermatitis     Discharge Instructions      Your x-rays were normal with no evidence of broken bones.  Use the brace for comfort and support.  Keep your knee and ankle elevated.  Use ice.  Follow-up with orthopedics.  Call them to schedule an appointment.  Start prednisone as  prescribed.  Do not take NSAIDs with this medication including aspirin, ibuprofen/Advil, naproxen/Aleve.  You can use acetaminophen/Tylenol for breakthrough pain.  If anything worsens return for reevaluation.  I believe your rash is related to an allergic reaction to the plant.  The prednisone should help with this.  You can also apply triamcinolone cream.  Do not scratch it.  If there is any sign of infection including redness, swelling, bleeding, drainage, fever you should be reevaluated.  K?t qu? ch?p X-quang c?a b?n bnh th??ng v khng c b?ng ch?ng gy x??ng.  S? d?ng n?p ?? t?o s? tho?i mi v h? tr?.  Gi? ??u g?i v m?t c chn c?a b?n nng cao.  S? d?ng n??c ?.  Theo di ch?nh hnh.  G?i cho h? ?? s?p x?p m?t cu?c h?n.  B?t ??u dng prednisone theo quy ??nh.  Khng dng NSAID v?i thu?c ny bao g?m aspirin, ibuprofen/Advil, naproxen/Aleve.  B?n c th? s? d?ng acetaminophen/Tylenol ?? gi?m c?n ?au ??t ng?t.  N?u c ?i?u g x?u ?i hy quay l?i ?? ?nh gi l?i.  Ti tin r?ng pht ban c?a b?n c lin quan ??n ph?n ?ng d? ?ng v?i cy.  Prednisone s? gip gi?i quy?t v?n ?? ny.  B?n c?ng c th? bi kem triamcinolone.  ??  ng gi n.  N?u c b?t k? d?u hi?u nhi?m trng no bao g?m ??, s?ng, ch?y mu, ch?y n??c, s?t th b?n nn ???c ?nh gi l?i.     ED Prescriptions     Medication Sig Dispense Auth. Provider   triamcinolone cream (KENALOG) 0.1 % Apply 1 Application topically 2 (two) times daily. 30 g Ishi Danser K, PA-C   predniSONE (DELTASONE) 10 MG tablet Take 40 mg (4 tablets) for day 1 and 2, take 30 mg (3 tablets) for days 3 and 4, take 20 mg (2 tablets) for days 5 and 6, take 10 mg (1 tablet) days 7 and 8 then stop. 20 tablet Lisett Dirusso, Noberto Retort, PA-C      PDMP not reviewed this encounter.   Jeani Hawking, PA-C 01/27/23 1548

## 2023-01-27 NOTE — ED Triage Notes (Signed)
Pt reports swelling and pain in right knee and right foot x 1 1/2 week. Tylenol gives some relief. Pain is worse when walking.

## 2023-02-23 ENCOUNTER — Encounter (INDEPENDENT_AMBULATORY_CARE_PROVIDER_SITE_OTHER): Payer: Self-pay

## 2023-03-08 ENCOUNTER — Other Ambulatory Visit: Payer: Self-pay | Admitting: Family Medicine

## 2023-03-08 DIAGNOSIS — I10 Essential (primary) hypertension: Secondary | ICD-10-CM

## 2023-03-08 DIAGNOSIS — E785 Hyperlipidemia, unspecified: Secondary | ICD-10-CM

## 2023-03-09 ENCOUNTER — Other Ambulatory Visit: Payer: Self-pay | Admitting: Family Medicine

## 2023-03-09 DIAGNOSIS — I10 Essential (primary) hypertension: Secondary | ICD-10-CM

## 2023-03-14 ENCOUNTER — Encounter: Payer: Self-pay | Admitting: Family Medicine

## 2023-03-14 ENCOUNTER — Ambulatory Visit (INDEPENDENT_AMBULATORY_CARE_PROVIDER_SITE_OTHER): Payer: 59 | Admitting: Family Medicine

## 2023-03-14 VITALS — BP 110/70 | HR 66 | Temp 97.7°F | Resp 18 | Ht 60.0 in | Wt 123.2 lb

## 2023-03-14 DIAGNOSIS — I10 Essential (primary) hypertension: Secondary | ICD-10-CM

## 2023-03-14 DIAGNOSIS — Z87438 Personal history of other diseases of male genital organs: Secondary | ICD-10-CM

## 2023-03-14 DIAGNOSIS — Z23 Encounter for immunization: Secondary | ICD-10-CM | POA: Diagnosis not present

## 2023-03-14 DIAGNOSIS — M25561 Pain in right knee: Secondary | ICD-10-CM

## 2023-03-14 DIAGNOSIS — E785 Hyperlipidemia, unspecified: Secondary | ICD-10-CM | POA: Diagnosis not present

## 2023-03-14 DIAGNOSIS — R200 Anesthesia of skin: Secondary | ICD-10-CM

## 2023-03-14 DIAGNOSIS — M1712 Unilateral primary osteoarthritis, left knee: Secondary | ICD-10-CM | POA: Diagnosis not present

## 2023-03-14 DIAGNOSIS — G8929 Other chronic pain: Secondary | ICD-10-CM | POA: Diagnosis not present

## 2023-03-14 MED ORDER — DICLOFENAC SODIUM 1 % EX GEL
4.0000 g | Freq: Four times a day (QID) | CUTANEOUS | 2 refills | Status: DC
Start: 2023-03-14 — End: 2024-05-28

## 2023-03-14 NOTE — Progress Notes (Signed)
Established Patient Office Visit  Subjective   Patient ID: Lance Oneal, male    DOB: 1951-02-25  Age: 72 y.o. MRN: [id number]  Chief Complaint  Patient presents with   Hypertension   Hyperlipidemia   Follow-up    HPI Discussed the use of AI scribe software for clinical note transcription with the patient, who gave verbal consent to proceed.  History of Present Illness   The patient presents with ongoing stomach discomfort despite taking Protonix before meals. He reports no heartburn or burping, but feels that his stomach is 'no good.' He had a scope last year which revealed 'something inside,' but he is unsure of the findings. He expresses a desire to ensure his stomach is okay, but it is unclear if the Protonix is helping. He is also unsure if he should be taking the Protonix twice daily, but currently takes it once in the morning.  Additionally, the patient reports knee pain that worsens with prolonged walking. He has been applying Voltaren gel to the area. He also reports a fall last month, after which he visited urgent care. He had an X-ray and scan, but no definitive diagnosis was made. He also mentions a cyst-like swelling on his knee that was painful last week but is currently not causing discomfort.  The patient also mentions numbness in his hands at night, for which he was referred to a specialist but has not yet received a call.Discussed the use of AI scribe software for clinical note transcription with the patient, who gave verbal consent to proceed.  History of Present Illness   The patient presents with ongoing stomach discomfort despite taking Protonix before meals. He reports no heartburn or burping, but feels that his stomach is 'no good.' He had a scope last year which revealed 'something inside,' but he is unsure of the findings. He expresses a desire to ensure his stomach is okay, but it is unclear if the Protonix is helping. He is also unsure if he should be taking the  Protonix twice daily, but currently takes it once in the morning.  Additionally, the patient reports knee pain that worsens with prolonged walking. He has been applying Voltaren gel to the area. He also reports a fall last month, after which he visited urgent care. He had an X-ray and scan, but no definitive diagnosis was made. He also mentions a cyst-like swelling on his knee that was painful last week but is currently not causing discomfort.  The patient also mentions numbness in his hands at night, for which he was referred to a specialist but has not yet received a call.          Patient Active Problem List   Diagnosis Date Noted   Bronchitis 04/07/2022   Prostatic hyperplasia 07/30/2021   Dyspepsia 04/09/2018   Seasonal allergic rhinitis due to pollen 04/09/2018   Benign prostatic hyperplasia (BPH) with urinary urgency 02/27/2017   Pharyngitis 09/26/2016   Cough 09/06/2016   Atherosclerosis of aorta (HCC) 09/06/2016   Preventative health care 08/30/2015   History of BPH 02/10/2015   Tooth pain 02/10/2015   Hyperlipidemia 06/07/2013   Urinary frequency 04/17/2013   Pain in left testicle 04/17/2013   Essential hypertension 01/01/2013   Past Medical History:  Diagnosis Date   BPH (benign prostatic hyperplasia)    GERD (gastroesophageal reflux disease)    History of gastric ulcer    per pt 1970s or 1980s s/p  open surgery for stomach ulcer   Hyperlipidemia  Hypertension    Right hydrocele    Urinary urgency    refractory   Past Surgical History:  Procedure Laterality Date   ABDOMINAL SURGERY     per pt done approxl 1970s or 1980s  for stomach ulcer (has large midline incision)   HYDROCELE EXCISION Right 07/30/2021   Procedure: HYDROCELECTOMY ADULT AND RIGHT ORCHIOPEXY;  Surgeon: Sebastian Ache, MD;  Location: [facility];  Service: Urology;  Laterality: Right;   TRANSURETHRAL RESECTION OF PROSTATE N/A 07/30/2021   Procedure: TRANSURETHRAL RESECTION OF  THE PROSTATE (TURP);  Surgeon: Sebastian Ache, MD;  Location: [facility];  Service: Urology;  Laterality: N/A;   Social History   Tobacco Use   Smoking status: Former    Current packs/day: 0.00    Types: Cigarettes    Quit date: 02/26/2005    Years since quitting: 18.0   Smokeless tobacco: Never  Vaping Use   Vaping status: Never Used  Substance Use Topics   Alcohol use: Yes   Drug use: Never   Social History   Socioeconomic History   Marital status: Married    Spouse name: Not on file   Number of children: 1   Years of education: Not on file   Highest education level: Not on file  Occupational History   Occupation: retired  Tobacco Use   Smoking status: Former    Current packs/day: 0.00    Types: Cigarettes    Quit date: 02/26/2005    Years since quitting: 18.0   Smokeless tobacco: Never  Vaping Use   Vaping status: Never Used  Substance and Sexual Activity   Alcohol use: Yes   Drug use: Never   Sexual activity: Not Currently    Partners: Female  Other Topics Concern   Not on file  Social History Narrative   Not on file   Social Determinants of Health   Financial Resource Strain: Low Risk  (10/26/2021)   Overall Financial Resource Strain (CARDIA)    Difficulty of Paying Living Expenses: Not hard at all  Food Insecurity: No Food Insecurity (12/28/2022)   Hunger Vital Sign    Worried About Running Out of Food in the Last Year: Never true    Ran Out of Food in the Last Year: Never true  Transportation Needs: No Transportation Needs (12/28/2022)   PRAPARE - Administrator, Civil Service (Medical): No    Lack of Transportation (Non-Medical): No  Physical Activity: Sufficiently Active (12/28/2022)   Exercise Vital Sign    Days of Exercise per Week: 7 days    Minutes of Exercise per Session: 150+ min  Stress: No Stress Concern Present (10/26/2021)   [employer] of Occupational Health - Occupational Stress Questionnaire    Feeling  of Stress : Not at all  Social Connections: Moderately Integrated (10/26/2021)   Social Connection and Isolation Panel [NHANES]    Frequency of Communication with Friends and Family: More than three times a week    Frequency of Social Gatherings with Friends and Family: More than three times a week    Attends Religious Services: Never    Database administrator or Organizations: Yes    Attends Banker Meetings: Never    Marital Status: Married  Catering manager Violence: Not At Risk (10/26/2021)   Humiliation, Afraid, Rape, and Kick questionnaire    Fear of Current or Ex-Partner: No    Emotionally Abused: No    Physically Abused: No    Sexually  Abused: No   Family Status  Relation Name Status   Mother  (Not Specified)   Father  (Not Specified)   Other  (Not Specified)   Neg Hx  (Not Specified)  No partnership data on file   Family History  Problem Relation Age of Onset   Hypertension Mother    Liver cancer Father    Liver cancer Other    Colon cancer Neg Hx    Pancreatic cancer Neg Hx    Stomach cancer Neg Hx    Allergies  Allergen Reactions   Lisinopril     Cough       Review of Systems  Constitutional:  Negative for fever and malaise/fatigue.  HENT:  Negative for congestion.   Eyes:  Negative for blurred vision.  Respiratory:  Negative for cough and shortness of breath.   Cardiovascular:  Negative for chest pain, palpitations and leg swelling.  Gastrointestinal:  Negative for vomiting.  Musculoskeletal:  Negative for back pain.  Skin:  Negative for rash.  Neurological:  Negative for loss of consciousness and headaches.      Objective:     BP 110/70 (BP Location: Left Arm, Patient Position: Sitting, Cuff Size: Normal)   Pulse 66   Temp 97.7 F (36.5 C) (Oral)   Resp 18   Ht 5' (1.524 m)   Wt 123 lb 3.2 oz (55.9 kg)   SpO2 98%   BMI 24.06 kg/m  BP Readings from Last 3 Encounters:  03/14/23 110/70  01/27/23 121/75  10/06/22 110/70   Wt  Readings from Last 3 Encounters:  03/14/23 123 lb 3.2 oz (55.9 kg)  10/06/22 126 lb 2 oz (57.2 kg)  09/22/22 131 lb 9.6 oz (59.7 kg)   SpO2 Readings from Last 3 Encounters:  03/14/23 98%  01/27/23 96%  09/22/22 98%      Physical Exam Vitals and nursing note reviewed.  Constitutional:      General: He is not in acute distress.    Appearance: Normal appearance. He is well-developed.  HENT:     Head: Normocephalic and atraumatic.  Eyes:     General: No scleral icterus.       Right eye: No discharge.        Left eye: No discharge.  Cardiovascular:     Rate and Rhythm: Normal rate and regular rhythm.     Heart sounds: No murmur heard. Pulmonary:     Effort: Pulmonary effort is normal. No respiratory distress.     Breath sounds: Normal breath sounds.  Musculoskeletal:        General: Normal range of motion.     Cervical back: Normal range of motion and neck supple.     Right lower leg: No edema.     Left lower leg: No edema.  Skin:    General: Skin is warm and dry.  Neurological:     Mental Status: He is alert and oriented to person, place, and time.  Psychiatric:        Mood and Affect: Mood normal.        Behavior: Behavior normal.        Thought Content: Thought content normal.        Judgment: Judgment normal.      No results found for any visits on 03/14/23.  Last CBC Lab Results  Component Value Date   WBC 6.3 09/22/2022   HGB 14.0 09/22/2022   HCT 41.2 09/22/2022   MCV 90.2 09/22/2022   MCH 30.4  04/11/2022   RDW 13.8 09/22/2022   PLT 306.0 09/22/2022   Last metabolic panel Lab Results  Component Value Date   GLUCOSE 89 09/22/2022   NA 139 09/22/2022   K 4.6 09/22/2022   CL 105 09/22/2022   CO2 28 09/22/2022   BUN 19 09/22/2022   CREATININE 0.97 09/22/2022   GFR 78.61 09/22/2022   CALCIUM 8.9 09/22/2022   PROT 6.6 09/22/2022   ALBUMIN 4.0 09/22/2022   BILITOT 1.3 (H) 09/22/2022   ALKPHOS 62 09/22/2022   AST 20 09/22/2022   ALT 24 09/22/2022    Last lipids Lab Results  Component Value Date   CHOL 157 09/22/2022   HDL 40.60 09/22/2022   LDLCALC 81 09/22/2022   LDLDIRECT 97.0 01/13/2022   TRIG 178.0 (H) 09/22/2022   CHOLHDL 4 09/22/2022   Last hemoglobin A1c No results found for: "HGBA1C" Last thyroid functions Lab Results  Component Value Date   TSH 0.59 09/06/2016   Last vitamin D No results found for: "25OHVITD2", "25OHVITD3", "VD25OH" Last vitamin B12 and Folate No results found for: "VITAMINB12", "FOLATE"    The 10-year ASCVD risk score (Arnett DK, et al., 2019) is: 17%    Assessment & Plan:   Problem List Items Addressed This Visit       Unprioritized   History of BPH   Hyperlipidemia - Primary    Tolerating statin, encouraged heart healthy diet, avoid trans fats, minimize simple carbs and saturated fats. Increase exercise as tolerated       Relevant Orders   CBC with Differential/Platelet   Comprehensive metabolic panel   Lipid panel   TSH   Essential hypertension    Well controlled, no changes to meds. Encouraged heart healthy diet such as the DASH diet and exercise as tolerated.        Other Visit Diagnoses     Primary osteoarthritis of left knee       Relevant Medications   diclofenac Sodium (VOLTAREN) 1 % GEL   Other Relevant Orders   Ambulatory referral to Orthopedic Surgery   Need for influenza vaccination       Relevant Orders   Flu Vaccine Trivalent High Dose (Fluad) (Completed)   Numbness in both hands       Relevant Orders   Ambulatory referral to Orthopedic Surgery   Chronic pain of right knee         Assessment and Plan    Gastroesophageal Reflux Disease (GERD) Reports ongoing stomach discomfort despite Protonix use. No heartburn or burping. Prior imaging showed some abnormality. -Continue Protonix 1 pill daily 30 minutes before breakfast. -Consider referral for endoscopy if symptoms persist.  Knee Pain Reports pain with walking and palpation. History of fall. No  current swelling. Using Voltaren gel. -Refer to orthopedic specialist for further evaluation and management. -Continue Voltaren gel as needed.  Hand Numbness Reports nocturnal hand numbness. No current specialist follow-up. -Reestablish referral to specialist for evaluation of potential neuropathy.  General Health Maintenance -Perform blood work today including cholesterol levels. -Administered flu shot today. -Follow-up in 6 months.        No follow-ups on file.    Donato Schultz, DO

## 2023-03-15 LAB — CBC WITH DIFFERENTIAL/PLATELET
Basophils Absolute: 0.1 10*3/uL (ref 0.0–0.1)
Basophils Relative: 0.6 % (ref 0.0–3.0)
Eosinophils Absolute: 0.1 10*3/uL (ref 0.0–0.7)
Eosinophils Relative: 1.5 % (ref 0.0–5.0)
HCT: 42.8 % (ref 39.0–52.0)
Hemoglobin: 14.1 g/dL (ref 13.0–17.0)
Lymphocytes Relative: 20.3 % (ref 12.0–46.0)
Lymphs Abs: 1.8 10*3/uL (ref 0.7–4.0)
MCHC: 32.8 g/dL (ref 30.0–36.0)
MCV: 91.9 fl (ref 78.0–100.0)
Monocytes Absolute: 0.6 10*3/uL (ref 0.1–1.0)
Monocytes Relative: 7.1 % (ref 3.0–12.0)
Neutro Abs: 6.4 10*3/uL (ref 1.4–7.7)
Neutrophils Relative %: 70.5 % (ref 43.0–77.0)
Platelets: 343 10*3/uL (ref 150.0–400.0)
RBC: 4.65 Mil/uL (ref 4.22–5.81)
RDW: 14 % (ref 11.5–15.5)
WBC: 9.1 10*3/uL (ref 4.0–10.5)

## 2023-03-15 LAB — COMPREHENSIVE METABOLIC PANEL
ALT: 23 U/L (ref 0–53)
AST: 23 U/L (ref 0–37)
Albumin: 4.4 g/dL (ref 3.5–5.2)
Alkaline Phosphatase: 74 U/L (ref 39–117)
BUN: 21 mg/dL (ref 6–23)
CO2: 31 meq/L (ref 19–32)
Calcium: 9.8 mg/dL (ref 8.4–10.5)
Chloride: 99 meq/L (ref 96–112)
Creatinine, Ser: 0.98 mg/dL (ref 0.40–1.50)
GFR: 77.39 mL/min (ref >60.00)
Glucose, Bld: 88 mg/dL (ref 70–99)
Potassium: 4.1 meq/L (ref 3.5–5.1)
Sodium: 138 meq/L (ref 135–145)
Total Bilirubin: 1.2 mg/dL (ref 0.2–1.2)
Total Protein: 7.4 g/dL (ref 6.0–8.3)

## 2023-03-15 LAB — LIPID PANEL
Cholesterol: 143 mg/dL (ref 0–200)
HDL: 34.4 mg/dL — ABNORMAL LOW (ref >39.00)
LDL Cholesterol: 64 mg/dL (ref 0–99)
NonHDL: 108.28
Total CHOL/HDL Ratio: 4
Triglycerides: 223 mg/dL — ABNORMAL HIGH (ref 0.0–149.0)
VLDL: 44.6 mg/dL — ABNORMAL HIGH (ref 0.0–40.0)

## 2023-03-15 LAB — TSH: TSH: 0.75 u[IU]/mL (ref 0.35–5.50)

## 2023-03-15 NOTE — Assessment & Plan Note (Signed)
Tolerating statin, encouraged heart healthy diet, avoid trans fats, minimize simple carbs and saturated fats. Increase exercise as tolerated 

## 2023-03-15 NOTE — Assessment & Plan Note (Signed)
Well controlled, no changes to meds. Encouraged heart healthy diet such as the DASH diet and exercise as tolerated.  °

## 2023-06-06 ENCOUNTER — Other Ambulatory Visit: Payer: Self-pay | Admitting: Internal Medicine

## 2023-06-06 DIAGNOSIS — I10 Essential (primary) hypertension: Secondary | ICD-10-CM

## 2023-06-07 ENCOUNTER — Other Ambulatory Visit: Payer: Self-pay | Admitting: Family Medicine

## 2023-06-07 DIAGNOSIS — I10 Essential (primary) hypertension: Secondary | ICD-10-CM

## 2023-06-13 ENCOUNTER — Other Ambulatory Visit: Payer: Self-pay

## 2023-06-13 ENCOUNTER — Other Ambulatory Visit (INDEPENDENT_AMBULATORY_CARE_PROVIDER_SITE_OTHER): Payer: 59

## 2023-06-13 DIAGNOSIS — E785 Hyperlipidemia, unspecified: Secondary | ICD-10-CM

## 2023-06-13 DIAGNOSIS — R351 Nocturia: Secondary | ICD-10-CM

## 2023-06-13 DIAGNOSIS — R350 Frequency of micturition: Secondary | ICD-10-CM

## 2023-06-13 LAB — POC URINALSYSI DIPSTICK (AUTOMATED)
Bilirubin, UA: NEGATIVE
Blood, UA: NEGATIVE
Glucose, UA: NEGATIVE
Ketones, UA: NEGATIVE
Leukocytes, UA: NEGATIVE
Nitrite, UA: NEGATIVE
Protein, UA: NEGATIVE
Spec Grav, UA: 1.01 (ref 1.010–1.025)
Urobilinogen, UA: 0.2 U/dL
pH, UA: 7.5 (ref 5.0–8.0)

## 2023-06-14 LAB — COMPREHENSIVE METABOLIC PANEL
ALT: 25 U/L (ref 0–53)
AST: 22 U/L (ref 0–37)
Albumin: 4.4 g/dL (ref 3.5–5.2)
Alkaline Phosphatase: 65 U/L (ref 39–117)
BUN: 21 mg/dL (ref 6–23)
CO2: 31 meq/L (ref 19–32)
Calcium: 9.6 mg/dL (ref 8.4–10.5)
Chloride: 102 meq/L (ref 96–112)
Creatinine, Ser: 1.01 mg/dL (ref 0.40–1.50)
GFR: 74.51 mL/min (ref >60.00)
Glucose, Bld: 94 mg/dL (ref 70–99)
Potassium: 4.7 meq/L (ref 3.5–5.1)
Sodium: 139 meq/L (ref 135–145)
Total Bilirubin: 0.9 mg/dL (ref 0.2–1.2)
Total Protein: 6.9 g/dL (ref 6.0–8.3)

## 2023-06-14 LAB — LIPID PANEL
Cholesterol: 174 mg/dL (ref 0–200)
HDL: 40.7 mg/dL (ref >39.00)
LDL Cholesterol: 100 mg/dL — ABNORMAL HIGH (ref 0–99)
NonHDL: 133.13
Total CHOL/HDL Ratio: 4
Triglycerides: 167 mg/dL — ABNORMAL HIGH (ref 0.0–149.0)
VLDL: 33.4 mg/dL (ref 0.0–40.0)

## 2023-06-14 LAB — URINE CULTURE
MICRO NUMBER:: 15802275
Result:: NO GROWTH
SPECIMEN QUALITY:: ADEQUATE

## 2023-06-14 LAB — PSA: PSA: 0.42 ng/mL (ref 0.10–4.00)

## 2023-07-20 ENCOUNTER — Encounter: Payer: Self-pay | Admitting: Physician Assistant

## 2023-07-21 ENCOUNTER — Other Ambulatory Visit: Payer: Self-pay | Admitting: Internal Medicine

## 2023-07-21 DIAGNOSIS — I10 Essential (primary) hypertension: Secondary | ICD-10-CM

## 2023-07-28 ENCOUNTER — Ambulatory Visit: Payer: 59 | Admitting: Physician Assistant

## 2023-09-01 ENCOUNTER — Other Ambulatory Visit: Payer: Self-pay | Admitting: Internal Medicine

## 2023-09-01 DIAGNOSIS — E785 Hyperlipidemia, unspecified: Secondary | ICD-10-CM

## 2023-12-09 ENCOUNTER — Other Ambulatory Visit: Payer: Self-pay | Admitting: Family Medicine

## 2023-12-09 DIAGNOSIS — I10 Essential (primary) hypertension: Secondary | ICD-10-CM

## 2023-12-12 ENCOUNTER — Ambulatory Visit (INDEPENDENT_AMBULATORY_CARE_PROVIDER_SITE_OTHER): Payer: 59 | Admitting: Family Medicine

## 2023-12-12 ENCOUNTER — Encounter: Payer: Self-pay | Admitting: Family Medicine

## 2023-12-12 VITALS — BP 120/70 | HR 78 | Temp 98.1°F | Resp 18 | Ht 60.0 in | Wt 124.6 lb

## 2023-12-12 DIAGNOSIS — K219 Gastro-esophageal reflux disease without esophagitis: Secondary | ICD-10-CM | POA: Diagnosis not present

## 2023-12-12 DIAGNOSIS — R1013 Epigastric pain: Secondary | ICD-10-CM

## 2023-12-12 DIAGNOSIS — I700 Atherosclerosis of aorta: Secondary | ICD-10-CM | POA: Diagnosis not present

## 2023-12-12 DIAGNOSIS — E785 Hyperlipidemia, unspecified: Secondary | ICD-10-CM

## 2023-12-12 DIAGNOSIS — H259 Unspecified age-related cataract: Secondary | ICD-10-CM | POA: Diagnosis not present

## 2023-12-12 DIAGNOSIS — I10 Essential (primary) hypertension: Secondary | ICD-10-CM

## 2023-12-12 MED ORDER — ATORVASTATIN CALCIUM 40 MG PO TABS
40.0000 mg | ORAL_TABLET | Freq: Every day | ORAL | 1 refills | Status: DC
Start: 2023-12-12 — End: 2024-05-28

## 2023-12-12 MED ORDER — PANTOPRAZOLE SODIUM 40 MG PO TBEC
40.0000 mg | DELAYED_RELEASE_TABLET | Freq: Two times a day (BID) | ORAL | 3 refills | Status: DC
Start: 2023-12-12 — End: 2024-05-28

## 2023-12-12 NOTE — Assessment & Plan Note (Signed)
 Well controlled, no changes to meds. Encouraged heart healthy diet such as the DASH diet and exercise as tolerated.

## 2023-12-12 NOTE — Assessment & Plan Note (Signed)
 Check labs Pt is on statin

## 2023-12-12 NOTE — Assessment & Plan Note (Signed)
 Tolerating statin, encouraged heart healthy diet, avoid trans fats, minimize simple carbs and saturated fats. Increase exercise as tolerated

## 2023-12-12 NOTE — Progress Notes (Signed)
 Established Patient Office Visit  Subjective   Patient ID: Lance Oneal, male    DOB: 1950/08/11  Age: 73 y.o. MRN: [id number]  Chief Complaint  Patient presents with   Hypertension   Hyperlipidemia   Follow-up    HPI Pt is a 73 yo asian male here for f/u bp and cholesterol.  He has complaint of break through gerd.  He takes the protonix  in am but wakes up every am with "dry/ sour" mouth.  He also is ready to have his cataracts done and will need a referral back to ophthalmology  Patient Active Problem List   Diagnosis Date Noted   Bronchitis 04/07/2022   Prostatic hyperplasia 07/30/2021   Dyspepsia 04/09/2018   Seasonal allergic rhinitis due to pollen 04/09/2018   Benign prostatic hyperplasia (BPH) with urinary urgency 02/27/2017   Pharyngitis 09/26/2016   Cough 09/06/2016   Atherosclerosis of aorta (HCC) 09/06/2016   Preventative health care 08/30/2015   History of BPH 02/10/2015   Tooth pain 02/10/2015   Hyperlipidemia 06/07/2013   Urinary frequency 04/17/2013   Pain in left testicle 04/17/2013   Essential hypertension 01/01/2013   Past Medical History:  Diagnosis Date   BPH (benign prostatic hyperplasia)    GERD (gastroesophageal reflux disease)    History of gastric ulcer    per pt 1970s or 1980s s/p  open surgery for stomach ulcer   Hyperlipidemia    Hypertension    Right hydrocele    Urinary urgency    refractory   Past Surgical History:  Procedure Laterality Date   ABDOMINAL SURGERY     per pt done approxl 1970s or 1980s  for stomach ulcer (has large midline incision)   HYDROCELE EXCISION Right 07/30/2021   Procedure: HYDROCELECTOMY ADULT AND RIGHT ORCHIOPEXY;  Surgeon: Osborn Blaze, MD;  Location: [facility];  Service: Urology;  Laterality: Right;   TRANSURETHRAL RESECTION OF PROSTATE N/A 07/30/2021   Procedure: TRANSURETHRAL RESECTION OF THE PROSTATE (TURP);  Surgeon: Osborn Blaze, MD;  Location: [facility];  Service:  Urology;  Laterality: N/A;   Social History   Tobacco Use   Smoking status: Former    Current packs/day: 0.00    Types: Cigarettes    Quit date: 02/26/2005    Years since quitting: 18.8   Smokeless tobacco: Never  Vaping Use   Vaping status: Never Used  Substance Use Topics   Alcohol use: Yes   Drug use: Never   Social History   Socioeconomic History   Marital status: Married    Spouse name: Not on file   Number of children: 1   Years of education: Not on file   Highest education level: Not on file  Occupational History   Occupation: retired  Tobacco Use   Smoking status: Former    Current packs/day: 0.00    Types: Cigarettes    Quit date: 02/26/2005    Years since quitting: 18.8   Smokeless tobacco: Never  Vaping Use   Vaping status: Never Used  Substance and Sexual Activity   Alcohol use: Yes   Drug use: Never   Sexual activity: Not Currently    Partners: Female  Other Topics Concern   Not on file  Social History Narrative   Not on file   Social Drivers of Health   Financial Resource Strain: Low Risk  (10/26/2021)   Overall Financial Resource Strain (CARDIA)    Difficulty of Paying Living Expenses: Not hard at all  Food Insecurity: No  Food Insecurity (12/28/2022)   Hunger Vital Sign    Worried About Running Out of Food in the Last Year: Never true    Ran Out of Food in the Last Year: Never true  Transportation Needs: No Transportation Needs (12/28/2022)   PRAPARE - Administrator, Civil Service (Medical): No    Lack of Transportation (Non-Medical): No  Physical Activity: Sufficiently Active (12/28/2022)   Exercise Vital Sign    Days of Exercise per Week: 7 days    Minutes of Exercise per Session: 150+ min  Stress: No Stress Concern Present (10/26/2021)   [employer] of Occupational Health - Occupational Stress Questionnaire    Feeling of Stress : Not at all  Social Connections: Moderately Integrated (10/26/2021)   Social Connection and  Isolation Panel [NHANES]    Frequency of Communication with Friends and Family: More than three times a week    Frequency of Social Gatherings with Friends and Family: More than three times a week    Attends Religious Services: Never    Database administrator or Organizations: Yes    Attends Banker Meetings: Never    Marital Status: Married  Catering manager Violence: Not At Risk (10/26/2021)   Humiliation, Afraid, Rape, and Kick questionnaire    Fear of Current or Ex-Partner: No    Emotionally Abused: No    Physically Abused: No    Sexually Abused: No   Family Status  Relation Name Status   Mother  (Not Specified)   Father  (Not Specified)   Other  (Not Specified)   Neg Hx  (Not Specified)  No partnership data on file   Family History  Problem Relation Age of Onset   Hypertension Mother    Liver cancer Father    Liver cancer Other    Colon cancer Neg Hx    Pancreatic cancer Neg Hx    Stomach cancer Neg Hx    Allergies  Allergen Reactions   Lisinopril      Cough       Review of Systems  Constitutional:  Negative for fever and malaise/fatigue.  HENT:  Negative for congestion.   Eyes:  Positive for blurred vision.  Respiratory:  Negative for cough and shortness of breath.   Cardiovascular:  Negative for chest pain, palpitations and leg swelling.  Gastrointestinal:  Positive for heartburn. Negative for abdominal pain, blood in stool, nausea and vomiting.  Genitourinary:  Negative for dysuria and frequency.  Musculoskeletal:  Negative for back pain and falls.  Skin:  Negative for rash.  Neurological:  Negative for dizziness, loss of consciousness and headaches.  Endo/Heme/Allergies:  Negative for environmental allergies.  Psychiatric/Behavioral:  Negative for depression. The patient is not nervous/anxious.       Objective:     BP 120/70 (BP Location: Left Arm, Patient Position: Sitting, Cuff Size: Normal)   Pulse 78   Temp 98.1 F (36.7 C) (Oral)    Resp 18   Ht 5' (1.524 m)   Wt 124 lb 9.6 oz (56.5 kg)   SpO2 98%   BMI 24.33 kg/m  BP Readings from Last 3 Encounters:  12/12/23 120/70  03/14/23 110/70  01/27/23 121/75   Wt Readings from Last 3 Encounters:  12/12/23 124 lb 9.6 oz (56.5 kg)  03/14/23 123 lb 3.2 oz (55.9 kg)  10/06/22 126 lb 2 oz (57.2 kg)   SpO2 Readings from Last 3 Encounters:  12/12/23 98%  03/14/23 98%  01/27/23 96%  Physical Exam Vitals and nursing note reviewed.  Constitutional:      General: He is not in acute distress.    Appearance: Normal appearance. He is well-developed.  HENT:     Head: Normocephalic and atraumatic.  Eyes:     General: No scleral icterus.       Right eye: No discharge.        Left eye: No discharge.  Cardiovascular:     Rate and Rhythm: Normal rate and regular rhythm.     Heart sounds: No murmur heard. Pulmonary:     Effort: Pulmonary effort is normal. No respiratory distress.     Breath sounds: Normal breath sounds.  Musculoskeletal:        General: Normal range of motion.     Cervical back: Normal range of motion and neck supple.     Right lower leg: No edema.     Left lower leg: No edema.  Skin:    General: Skin is warm and dry.  Neurological:     Mental Status: He is alert and oriented to person, place, and time.  Psychiatric:        Mood and Affect: Mood normal.        Behavior: Behavior normal.        Thought Content: Thought content normal.        Judgment: Judgment normal.      No results found for any visits on 12/12/23.  Last CBC Lab Results  Component Value Date   WBC 9.1 03/14/2023   HGB 14.1 03/14/2023   HCT 42.8 03/14/2023   MCV 91.9 03/14/2023   MCH 30.4 04/11/2022   RDW 14.0 03/14/2023   PLT 343.0 03/14/2023   Last metabolic panel Lab Results  Component Value Date   GLUCOSE 94 06/13/2023   NA 139 06/13/2023   K 4.7 06/13/2023   CL 102 06/13/2023   CO2 31 06/13/2023   BUN 21 06/13/2023   CREATININE 1.01 06/13/2023   GFR  74.51 06/13/2023   CALCIUM  9.6 06/13/2023   PROT 6.9 06/13/2023   ALBUMIN 4.4 06/13/2023   BILITOT 0.9 06/13/2023   ALKPHOS 65 06/13/2023   AST 22 06/13/2023   ALT 25 06/13/2023   Last lipids Lab Results  Component Value Date   CHOL 174 06/13/2023   HDL 40.70 06/13/2023   LDLCALC 100 (H) 06/13/2023   LDLDIRECT 97.0 01/13/2022   TRIG 167.0 (H) 06/13/2023   CHOLHDL 4 06/13/2023   Last hemoglobin A1c No results found for: "HGBA1C" Last thyroid  functions Lab Results  Component Value Date   TSH 0.75 03/14/2023   Last vitamin D No results found for: "25OHVITD2", "25OHVITD3", "VD25OH" Last vitamin B12 and Folate No results found for: "VITAMINB12", "FOLATE"    The 10-year ASCVD risk score (Arnett DK, et al., 2019) is: 21.9%    Assessment & Plan:   Problem List Items Addressed This Visit       Unprioritized   Hyperlipidemia   Tolerating statin, encouraged heart healthy diet, avoid trans fats, minimize simple carbs and saturated fats. Increase exercise as tolerated       Relevant Medications   atorvastatin  (LIPITOR) 40 MG tablet   Other Relevant Orders   CBC with Differential/Platelet   Comprehensive metabolic panel with GFR   Lipid panel   TSH   Essential hypertension   Well controlled, no changes to meds. Encouraged heart healthy diet such as the DASH diet and exercise as tolerated.  Relevant Medications   atorvastatin  (LIPITOR) 40 MG tablet   Other Relevant Orders   CBC with Differential/Platelet   Comprehensive metabolic panel with GFR   Lipid panel   TSH   Dyspepsia   Inc protonix  to bid       Relevant Medications   pantoprazole  (PROTONIX ) 40 MG tablet   Atherosclerosis of aorta (HCC)   Check labs Pt is on statin      Relevant Medications   atorvastatin  (LIPITOR) 40 MG tablet   Other Relevant Orders   CBC with Differential/Platelet   Comprehensive metabolic panel with GFR   Lipid panel   TSH   Other Visit Diagnoses        Age-related cataract of both eyes, unspecified age-related cataract type    -  Primary   Relevant Orders   Ambulatory referral to Ophthalmology     Gastroesophageal reflux disease, unspecified whether esophagitis present       Relevant Medications   pantoprazole  (PROTONIX ) 40 MG tablet   Other Relevant Orders   Ambulatory referral to Ophthalmology   Ambulatory referral to Gastroenterology     Assessment and Plan Assessment & Plan     No follow-ups on file.    Makylie Rivere R Lowne Chase, DO

## 2023-12-12 NOTE — Assessment & Plan Note (Signed)
 Inc protonix  to bid

## 2023-12-13 ENCOUNTER — Ambulatory Visit: Payer: Self-pay | Admitting: Family Medicine

## 2023-12-13 LAB — CBC WITH DIFFERENTIAL/PLATELET
Basophils Absolute: 0.1 10*3/uL (ref 0.0–0.1)
Basophils Relative: 0.7 % (ref 0.0–3.0)
Eosinophils Absolute: 0.2 10*3/uL (ref 0.0–0.7)
Eosinophils Relative: 1.9 % (ref 0.0–5.0)
HCT: 44.5 % (ref 39.0–52.0)
Hemoglobin: 14.8 g/dL (ref 13.0–17.0)
Lymphocytes Relative: 20.4 % (ref 12.0–46.0)
Lymphs Abs: 1.6 10*3/uL (ref 0.7–4.0)
MCHC: 33.2 g/dL (ref 30.0–36.0)
MCV: 90 fl (ref 78.0–100.0)
Monocytes Absolute: 0.6 10*3/uL (ref 0.1–1.0)
Monocytes Relative: 7.6 % (ref 3.0–12.0)
Neutro Abs: 5.5 10*3/uL (ref 1.4–7.7)
Neutrophils Relative %: 69.4 % (ref 43.0–77.0)
Platelets: 309 10*3/uL (ref 150.0–400.0)
RBC: 4.95 Mil/uL (ref 4.22–5.81)
RDW: 13.3 % (ref 11.5–15.5)
WBC: 8 10*3/uL (ref 4.0–10.5)

## 2023-12-13 LAB — LIPID PANEL
Cholesterol: 143 mg/dL (ref 0–200)
HDL: 44.9 mg/dL (ref >39.00)
LDL Cholesterol: 77 mg/dL (ref 0–99)
NonHDL: 98.56
Total CHOL/HDL Ratio: 3
Triglycerides: 106 mg/dL (ref 0.0–149.0)
VLDL: 21.2 mg/dL (ref 0.0–40.0)

## 2023-12-13 LAB — COMPREHENSIVE METABOLIC PANEL WITH GFR
ALT: 32 U/L (ref 0–53)
AST: 29 U/L (ref 0–37)
Albumin: 4.6 g/dL (ref 3.5–5.2)
Alkaline Phosphatase: 63 U/L (ref 39–117)
BUN: 27 mg/dL — ABNORMAL HIGH (ref 6–23)
CO2: 30 meq/L (ref 19–32)
Calcium: 9.6 mg/dL (ref 8.4–10.5)
Chloride: 103 meq/L (ref 96–112)
Creatinine, Ser: 1.16 mg/dL (ref 0.40–1.50)
GFR: 62.88 mL/min (ref >60.00)
Glucose, Bld: 94 mg/dL (ref 70–99)
Potassium: 4.7 meq/L (ref 3.5–5.1)
Sodium: 141 meq/L (ref 135–145)
Total Bilirubin: 1.2 mg/dL (ref 0.2–1.2)
Total Protein: 7.2 g/dL (ref 6.0–8.3)

## 2023-12-13 LAB — TSH: TSH: 0.63 u[IU]/mL (ref 0.35–5.50)

## 2024-01-26 DIAGNOSIS — H524 Presbyopia: Secondary | ICD-10-CM | POA: Diagnosis not present

## 2024-01-26 DIAGNOSIS — H2513 Age-related nuclear cataract, bilateral: Secondary | ICD-10-CM | POA: Diagnosis not present

## 2024-01-29 NOTE — Progress Notes (Signed)
 Lance Oneal [id number] 02/26/1951   Chief Complaint: GERD  Referring Provider: [name], [name], * Primary GI MD: Dr. Legrand  HPI: Lance Oneal is a 73 y.o. male with past medical history of BPH s/p TURP, GERD, PUD (per patient 1970s/1980s s/p open surgery for ulcer), HLD, HTN who presents today for a complaint of GERD.    Patient initially seen in office 05/2022 for colon cancer screening and reported distant history of upper GI ulcer requiring surgery.  Subsequent Cologuard negative, upper GI series with finding of mild esophageal dysmotility and possible postoperative changes of the duodenum.   Last seen in office 10/06/2022 by Dr. Legrand.  At that time patient reported intermittent heartburn, throat discomfort.  He was scheduled for EGD to evaluate for active duodenal ulcer versus postsurgical changes as seen on upper GI series, and to biopsy for H. pylori.  Patient decided at that time to schedule procedure after his return from [place] in 3 months.  Currently taking protonix  40 mg BID.   Patient speaks [place] and an interpreter is present for the visit today. Patient states he is doing fairly well on Protonix  40 mg twice daily.  He denies heartburn or acid reflux, though he usually wakes up with a bitter taste in his mouth in the morning.  States he has been taking a turmeric supplement from [employer] and this has helped with relieving the bad taste in his mouth.  He denies any dysphagia, nausea, vomiting, abdominal pain.  States that he has regular bowel movements and denies diarrhea, constipation, rectal bleeding, or melena.  He would like to proceed with EGD as previously discussed.  Prefers Cologuard test for colon cancer screening and will be due for repeat next year.  Denies chest pain or shortness of breath.  Previous GI Procedures/Imaging   UGI series 06/09/2022 Mild esophageal dysmotility with otherwise normal appearance of the esophagus.   Normal single contrast  appearance of the stomach.   Question of mild duodenal bulb distortion with a pattern that raises the possibility of sequela of distortion and scarring from prior ulcer disease or postoperative change. Small diverticulum associated with the duodenal bulb is also possible. Similar appearance noted on previous CT imaging from 2022.   Past Medical History:  Diagnosis Date   BPH (benign prostatic hyperplasia)    GERD (gastroesophageal reflux disease)    History of gastric ulcer    per pt 1970s or 1980s s/p  open surgery for stomach ulcer   Hyperlipidemia    Hypertension    Right hydrocele    Urinary urgency    refractory    Past Surgical History:  Procedure Laterality Date   ABDOMINAL SURGERY     per pt done approxl 1970s or 1980s  for stomach ulcer (has large midline incision)   HYDROCELE EXCISION Right 07/30/2021   Procedure: HYDROCELECTOMY ADULT AND RIGHT ORCHIOPEXY;  Surgeon: Alvaro Hummer, MD;  Location[facility];  Service: Urology;  Laterality: Right;   TRANSURETHRAL RESECTION OF PROSTATE N/A 07/30/2021   Procedure: TRANSURETHRAL RESECTION OF THE PROSTATE (TURP);  Surgeon: Alvaro Hummer, MD;  Location: [facility];  Service: Urology;  Laterality: N/A;    Current Outpatient Medications  Medication Sig Dispense Refill   atorvastatin  (LIPITOR) 40 MG tablet Take 1 tablet (40 mg total) by mouth daily. 90 tablet 1   diclofenac  Sodium (VOLTAREN ) 1 % GEL Apply 4 g topically 4 (four) times daily. 150 g 2   finasteride  (PROSCAR ) 5 MG tablet  Take 1 tablet (5 mg total) by mouth daily. 90 tablet 1   fluticasone  (FLONASE ) 50 MCG/ACT nasal spray SPRAY 2 SPRAYS INTO EACH NOSTRIL EVERY DAY 48 mL 2   hydrochlorothiazide  (HYDRODIURIL ) 25 MG tablet Take 1 tablet (25 mg total) by mouth daily. 90 tablet 1   levocetirizine (XYZAL ) 5 MG tablet TAKE 1 TABLET BY MOUTH EVERY DAY IN THE EVENING 90 tablet 1   loratadine  (CLARITIN ) 10 MG tablet Take 1 tablet (10 mg  total) by mouth daily. 30 tablet 11   losartan  (COZAAR ) 25 MG tablet TAKE 2 TABLETS BY MOUTH EVERY DAY 180 tablet 0   Multiple Vitamin (MULTI-VITAMIN) tablet Take 1 tablet by mouth daily.     pantoprazole  (PROTONIX ) 40 MG tablet Take 1 tablet (40 mg total) by mouth 2 (two) times daily. 180 tablet 3   tolterodine (DETROL) 2 MG tablet      traMADol  (ULTRAM ) 50 MG tablet Take 1 tablet (50 mg total) by mouth every 6 (six) hours as needed (pain). 12 tablet 0   triamcinolone  cream (KENALOG ) 0.1 % Apply 1 Application topically 2 (two) times daily. 30 g 1   No current facility-administered medications for this visit.    Allergies as of 01/30/2024 - Review Complete 12/12/2023  Allergen Reaction Noted   Lisinopril   12/30/2012    Family History  Problem Relation Age of Onset   Hypertension Mother    Liver cancer Father    Liver cancer Other    Colon cancer Neg Hx    Pancreatic cancer Neg Hx    Stomach cancer Neg Hx     Social History   Tobacco Use   Smoking status: Former    Current packs/day: 0.00    Types: Cigarettes    Quit date: 02/26/2005    Years since quitting: 18.9   Smokeless tobacco: Never  Vaping Use   Vaping status: Never Used  Substance Use Topics   Alcohol use: Yes   Drug use: Never     Review of Systems:    Constitutional: No weight loss, fever, chills, weakness or fatigue Skin: No rash or itching Cardiovascular: No chest pain, chest pressure or palpitations   Respiratory: No SOB or cough Gastrointestinal: See HPI and otherwise negative Neurological: No headache, dizziness or syncope Musculoskeletal: No new muscle or joint pain Hematologic: No bleeding or bruising    Physical Exam:  Vital signs: BP 110/68 (BP Location: Left Arm, Patient Position: Sitting, Cuff Size: Normal)   Pulse 71   Ht 5' (1.524 m)   Wt 123 lb 4 oz (55.9 kg)   BMI 24.07 kg/m    Wt Readings from Last 3 Encounters:  01/30/24 123 lb 4 oz (55.9 kg)  12/12/23 124 lb 9.6 oz (56.5 kg)   03/14/23 123 lb 3.2 oz (55.9 kg)     Constitutional: Pleasant male in NAD, alert and cooperative Head:  Normocephalic and atraumatic.  Eyes: No scleral icterus.  Respiratory: Respirations even and unlabored. Lungs clear to auscultation bilaterally.  No wheezes, crackles, or rhonchi.  Cardiovascular:  Regular rate and rhythm. No murmurs. No peripheral edema. Gastrointestinal:  Soft, nondistended, nontender.  Midline surgical scar.  No rebound or guarding. Normal bowel sounds. No appreciable masses or hepatomegaly. Rectal:  Not performed.  Neurologic:  Alert and oriented x4;  grossly normal neurologically.  Skin:   Dry and intact without significant lesions or rashes. Psychiatric: Oriented to person, place and time. Demonstrates good judgement and reason without abnormal affect or behaviors.  RELEVANT LABS AND IMAGING: CBC    Component Value Date/Time   WBC 8.0 12/12/2023 1352   RBC 4.95 12/12/2023 1352   HGB 14.8 12/12/2023 1352   HGB 14.4 04/11/2022 1350   HCT 44.5 12/12/2023 1352   HCT 44.4 04/11/2022 1350   PLT 309.0 12/12/2023 1352   PLT 353 04/11/2022 1350   MCV 90.0 12/12/2023 1352   MCV 94 04/11/2022 1350   MCH 30.4 04/11/2022 1350   MCH 30.2 11/25/2012 1110   MCHC 33.2 12/12/2023 1352   RDW 13.3 12/12/2023 1352   RDW 13.2 04/11/2022 1350   LYMPHSABS 1.6 12/12/2023 1352   LYMPHSABS 1.8 04/11/2022 1350   MONOABS 0.6 12/12/2023 1352   EOSABS 0.2 12/12/2023 1352   EOSABS 0.1 04/11/2022 1350   BASOSABS 0.1 12/12/2023 1352   BASOSABS 0.1 04/11/2022 1350    CMP     Component Value Date/Time   NA 141 12/12/2023 1352   NA 138 04/11/2022 1350   K 4.7 12/12/2023 1352   CL 103 12/12/2023 1352   CO2 30 12/12/2023 1352   GLUCOSE 94 12/12/2023 1352   BUN 27 (H) 12/12/2023 1352   BUN 24 04/11/2022 1350   CREATININE 1.16 12/12/2023 1352   CALCIUM  9.6 12/12/2023 1352   PROT 7.2 12/12/2023 1352   ALBUMIN 4.6 12/12/2023 1352   AST 29 12/12/2023 1352   ALT 32  12/12/2023 1352   ALKPHOS 63 12/12/2023 1352   BILITOT 1.2 12/12/2023 1352   GFRNONAA >90 11/25/2012 1110   GFRAA >90 11/25/2012 1110     Assessment/Plan:   GERD History of duodenal ulcer Patient here today for follow-up of GERD, with history of duodenal ulcer requiring surgery.  Unknown if he has been tested or treated for H. pylori.  At last office visit 09/2022 the plan was to schedule EGD for further evaluation of patient's symptoms at that time which included heartburn and throat discomfort, and to follow-up on abnormal findings on UGI series 05/2022.  Patient is on Protonix  40 mg daily and states this has helped with his heartburn, but he wakes up each morning with a bitter taste in his throat.  He would like to proceed with EGD, though requests this procedure be scheduled for October as he is having cataract surgery on both eyes in August.  - Schedule EGD. I thoroughly discussed the procedure with the patient to include nature of the procedure, alternatives, benefits, and risks (including but not limited to bleeding, infection, perforation, anesthesia/cardiac/pulmonary complications). Patient verbalized understanding and gave verbal consent to proceed with procedure.  - Continue Protonix  40 mg twice daily  Screening for colon cancer Cologuard 06/2022 was negative.  Patient prefers this method of colon cancer screening.  - Cologuard due 06/2025   Camie Furbish, PA-C [facility] 01/29/2024, 7:29 PM  Patient Care Team: [name], [name], DO as PCP - General (Family Medicine) Waylan Cain, MD as Consulting Physician (Ophthalmology) Alvaro Ricardo KATHEE Raddle., MD as Consulting Physician (Urology)

## 2024-01-30 ENCOUNTER — Ambulatory Visit (INDEPENDENT_AMBULATORY_CARE_PROVIDER_SITE_OTHER): Admitting: Gastroenterology

## 2024-01-30 ENCOUNTER — Encounter: Payer: Self-pay | Admitting: Gastroenterology

## 2024-01-30 VITALS — BP 110/68 | HR 71 | Ht 60.0 in | Wt 123.2 lb

## 2024-01-30 DIAGNOSIS — Z1211 Encounter for screening for malignant neoplasm of colon: Secondary | ICD-10-CM

## 2024-01-30 DIAGNOSIS — Z8719 Personal history of other diseases of the digestive system: Secondary | ICD-10-CM

## 2024-01-30 DIAGNOSIS — Z8711 Personal history of peptic ulcer disease: Secondary | ICD-10-CM | POA: Diagnosis not present

## 2024-01-30 DIAGNOSIS — K219 Gastro-esophageal reflux disease without esophagitis: Secondary | ICD-10-CM

## 2024-01-30 NOTE — Patient Instructions (Addendum)
 Continue Prontonix 40 mg daily.  Follow up 1 month after procedure (05/16/24).  You have been scheduled for an endoscopy. Please follow written instructions given to you at your visit today.  If you use inhalers (even only as needed), please bring them with you on the day of your procedure.  If you take any of the following medications, they will need to be adjusted prior to your procedure:   DO NOT TAKE 7 DAYS PRIOR TO TEST- Trulicity (dulaglutide) Ozempic, Wegovy (semaglutide) Mounjaro (tirzepatide) Bydureon Bcise (exanatide extended release)  DO NOT TAKE 1 DAY PRIOR TO YOUR TEST Rybelsus (semaglutide) Adlyxin (lixisenatide) Victoza (liraglutide) Byetta (exanatide) ___________________________________________________________________________   _______________________________________________________  If your blood pressure at your visit was 140/90 or greater, please contact your primary care physician to follow up on this.  _______________________________________________________  If you are age 29 or older, your body mass index should be between 23-30. Your Body mass index is 24.07 kg/m. If this is out of the aforementioned range listed, please consider follow up with your Primary Care Provider.  If you are age 7 or younger, your body mass index should be between 19-25. Your Body mass index is 24.07 kg/m. If this is out of the aformentioned range listed, please consider follow up with your Primary Care Provider.   ________________________________________________________  The [facility] providers would like to encourage you to use MYCHART to communicate with providers for non-urgent requests or questions.  Due to long hold times on the telephone, sending your provider a message by [facility] may be a faster and more efficient way to get a response.  Please allow 48 business hours for a response.  Please remember that this is for non-urgent requests.   _______________________________________________________  [facility] is using a team-based approach to care.  Your team is made up of your doctor and two to three APPS. Our APPS (Nurse Practitioners and Physician Assistants) work with your physician to ensure care continuity for you. They are fully qualified to address your health concerns and develop a treatment plan. They communicate directly with your gastroenterologist to care for you. Seeing the Advanced Practice Practitioners on your physician's team can help you by facilitating care more promptly, often allowing for earlier appointments, access to diagnostic testing, procedures, and other specialty referrals.

## 2024-02-05 NOTE — Progress Notes (Signed)
 ____________________________________________________________  Attending physician addendum:  Thank you for sending this case to me. I have reviewed the entire note and agree with the plan.   Amada Jupiter, MD  ____________________________________________________________

## 2024-02-07 DIAGNOSIS — H2511 Age-related nuclear cataract, right eye: Secondary | ICD-10-CM | POA: Diagnosis not present

## 2024-02-07 DIAGNOSIS — H21561 Pupillary abnormality, right eye: Secondary | ICD-10-CM | POA: Diagnosis not present

## 2024-02-21 DIAGNOSIS — H5712 Ocular pain, left eye: Secondary | ICD-10-CM | POA: Diagnosis not present

## 2024-02-21 DIAGNOSIS — T1502XA Foreign body in cornea, left eye, initial encounter: Secondary | ICD-10-CM | POA: Diagnosis not present

## 2024-02-29 DIAGNOSIS — H353132 Nonexudative age-related macular degeneration, bilateral, intermediate dry stage: Secondary | ICD-10-CM | POA: Diagnosis not present

## 2024-02-29 DIAGNOSIS — H04123 Dry eye syndrome of bilateral lacrimal glands: Secondary | ICD-10-CM | POA: Diagnosis not present

## 2024-03-07 ENCOUNTER — Other Ambulatory Visit: Payer: Self-pay | Admitting: Family Medicine

## 2024-03-07 DIAGNOSIS — I10 Essential (primary) hypertension: Secondary | ICD-10-CM

## 2024-03-16 ENCOUNTER — Other Ambulatory Visit: Payer: Self-pay | Admitting: Family Medicine

## 2024-03-16 DIAGNOSIS — I10 Essential (primary) hypertension: Secondary | ICD-10-CM

## 2024-03-22 DIAGNOSIS — Z961 Presence of intraocular lens: Secondary | ICD-10-CM | POA: Diagnosis not present

## 2024-03-22 DIAGNOSIS — H353132 Nonexudative age-related macular degeneration, bilateral, intermediate dry stage: Secondary | ICD-10-CM | POA: Diagnosis not present

## 2024-04-01 DIAGNOSIS — H353132 Nonexudative age-related macular degeneration, bilateral, intermediate dry stage: Secondary | ICD-10-CM | POA: Diagnosis not present

## 2024-04-01 DIAGNOSIS — H35033 Hypertensive retinopathy, bilateral: Secondary | ICD-10-CM | POA: Diagnosis not present

## 2024-04-01 DIAGNOSIS — H43822 Vitreomacular adhesion, left eye: Secondary | ICD-10-CM | POA: Diagnosis not present

## 2024-04-01 DIAGNOSIS — H2512 Age-related nuclear cataract, left eye: Secondary | ICD-10-CM | POA: Diagnosis not present

## 2024-04-01 DIAGNOSIS — Z961 Presence of intraocular lens: Secondary | ICD-10-CM | POA: Diagnosis not present

## 2024-04-01 DIAGNOSIS — H43811 Vitreous degeneration, right eye: Secondary | ICD-10-CM | POA: Diagnosis not present

## 2024-04-04 ENCOUNTER — Ambulatory Visit: Payer: Self-pay

## 2024-04-04 ENCOUNTER — Ambulatory Visit
Admission: EM | Admit: 2024-04-04 | Discharge: 2024-04-04 | Disposition: A | Discharge status: Home / Self Care | Attending: Family Medicine | Admitting: Family Medicine

## 2024-04-04 ENCOUNTER — Other Ambulatory Visit: Payer: Self-pay

## 2024-04-04 DIAGNOSIS — R300 Dysuria: Secondary | ICD-10-CM | POA: Insufficient documentation

## 2024-04-04 DIAGNOSIS — R6883 Chills (without fever): Secondary | ICD-10-CM | POA: Insufficient documentation

## 2024-04-04 DIAGNOSIS — R350 Frequency of micturition: Secondary | ICD-10-CM | POA: Diagnosis not present

## 2024-04-04 LAB — POC COVID19/FLU A&B COMBO
Covid Antigen, POC: NEGATIVE
Influenza A Antigen, POC: NEGATIVE
Influenza B Antigen, POC: NEGATIVE

## 2024-04-04 LAB — POCT URINE DIPSTICK
Bilirubin, UA: NEGATIVE
Glucose, UA: NEGATIVE mg/dL
Ketones, POC UA: NEGATIVE mg/dL
Leukocytes, UA: NEGATIVE
Nitrite, UA: NEGATIVE
Spec Grav, UA: 1.025 (ref 1.010–1.025)
Urobilinogen, UA: 0.2 U/dL
pH, UA: 5.5 (ref 5.0–8.0)

## 2024-04-04 MED ORDER — SULFAMETHOXAZOLE-TRIMETHOPRIM 800-160 MG PO TABS: 1.0000 | ORAL_TABLET | Freq: Two times a day (BID) | ORAL | 0 refills | Status: DC

## 2024-04-04 NOTE — ED Notes (Signed)
I  used interpreter to triage pt

## 2024-04-04 NOTE — Telephone Encounter (Signed)
 FYI Only or Action Required?: FYI only for provider.  Patient was last seen in primary care on 12/12/2023 by [name], [name], DO.  Called Nurse Triage reporting urine issues.  Symptoms began several days ago.  Interventions attempted: Nothing.  Symptoms are: unchanged.  Triage Disposition: No disposition on file.  Patient/caregiver understands and will follow disposition?:   To UC  Reason for Disposition  Urinating more frequently than usual (i.e., frequency) OR new-onset of the feeling of an urgent need to urinate (i.e., urgency)  Answer Assessment - Initial Assessment Questions 1. SYMPTOM: What's the main symptom you're concerned about? (e.g., frequency, incontinence)     Bright yellow urine, very little urine 2. ONSET: When did the  symptoms  start?     Past week 3. PAIN: Is there any pain? If Yes, ask: How bad is it? (Scale: 1-10; mild, moderate, severe)     denies 4. CAUSE: What do you think is causing the symptoms?     uti 5. OTHER SYMPTOMS: Do you have any other symptoms? (e.g., blood in urine, fever, flank pain, pain with urination)     Fever and has been taking tylenol  6. PREGNANCY: Is there any chance you are pregnant? When was your last menstrual period?     na  Protocols used: Urinary Symptoms-A-AH

## 2024-04-04 NOTE — Telephone Encounter (Addendum)
 FYI Only or Action Required?: FYI only for provider.  Patient was last seen in primary care on 12/12/2023 by [name], [name], DO.  Called Nurse Triage reporting urine issues.  Symptoms began several days ago.  Interventions attempted: Nothing.  Symptoms are: unchanged.  Triage Disposition: See Physician Within 24 Hours  Patient/caregiver understands and will follow disposition?: Yes First attempt made; no answer Summary: Fever   Patient is calling in stating his urine was a very yellow color and had a fever since yesterday. Patient is requesting a urine test and a medication.

## 2024-04-04 NOTE — ED Triage Notes (Signed)
 Pt c/o dysuria, frequent urination, loss of appetite and chills1wk.

## 2024-04-04 NOTE — Discharge Instructions (Addendum)
 Start Bactrim  twice daily for 14 days.  This will cover potential infection of your prostate as well as your urine.  The clinic will send your urine for culture and contact you with these results if positive.  Lots of rest.  Please follow-up with your PCP in 1 to 2 days for recheck.  Please go to the ER for any worsening symptoms that occur prior to seeing your PCP.  This includes but is not limited to continued fever, continued urinary symptoms, inability to urinate or difficulty urinating, or any new concerns that arise.  I hope you feel better soon!

## 2024-04-04 NOTE — ED Provider Notes (Signed)
 [facility]    CSN: [id number] Arrival date & time: 04/04/24  1126      History   Chief Complaint Chief Complaint  Patient presents with   Dysuria   Urinary Frequency    HPI Lance Oneal is a 73 y.o. male dents for possible UTI.   translation line uses patient speaks [place]  Patient reports 1 week of urinary frequency with intermittent dysuria.  Denies hematuria, frequency, nausea/vomiting, flank or back pain.  No testicular pain or swelling or penile discharge or STD concern.  States yesterday he developed chills with tactile fevers that improved with Tylenol .  Denies any cough, congestion or sore throat.  No difficulty starting or stopping the urine stream.  Does have a history of BPH.  Eating and drinking normally otherwise.  No other concerns at this time   Dysuria Presenting symptoms: dysuria   Associated symptoms: urinary frequency   Urinary Frequency    Past Medical History:  Diagnosis Date   BPH (benign prostatic hyperplasia)    GERD (gastroesophageal reflux disease)    History of gastric ulcer    per pt 1970s or 1980s s/p  open surgery for stomach ulcer   Hyperlipidemia    Hypertension    Right hydrocele    Urinary urgency    refractory    Patient Active Problem List   Diagnosis Date Noted   Bronchitis 04/07/2022   Prostatic hyperplasia 07/30/2021   Dyspepsia 04/09/2018   Seasonal allergic rhinitis due to pollen 04/09/2018   Benign prostatic hyperplasia (BPH) with urinary urgency 02/27/2017   Pharyngitis 09/26/2016   Cough 09/06/2016   Atherosclerosis of aorta 09/06/2016   Preventative health care 08/30/2015   History of BPH 02/10/2015   Tooth pain 02/10/2015   Hyperlipidemia 06/07/2013   Urinary frequency 04/17/2013   Pain in left testicle 04/17/2013   Essential hypertension 01/01/2013    Past Surgical History:  Procedure Laterality Date   ABDOMINAL SURGERY     per pt done approxl 1970s or 1980s  for stomach ulcer (has large midline  incision)   HYDROCELE EXCISION Right 07/30/2021   Procedure: HYDROCELECTOMY ADULT AND RIGHT ORCHIOPEXY;  Surgeon: Alvaro Hummer, MD;  Location: [facility];  Service: Urology;  Laterality: Right;   TRANSURETHRAL RESECTION OF PROSTATE N/A 07/30/2021   Procedure: TRANSURETHRAL RESECTION OF THE PROSTATE (TURP);  Surgeon: Alvaro Hummer, MD;  Location: [facility];  Service: Urology;  Laterality: N/A;       Home Medications    Prior to Admission medications   Medication Sig Start Date End Date Taking? Authorizing Provider  sulfamethoxazole -trimethoprim  (BACTRIM  DS) 800-160 MG tablet Take 1 tablet by mouth 2 (two) times daily for 14 days. 04/04/24 04/18/24 Yes Tyrone Balash, Jodi R, NP  atorvastatin  (LIPITOR) 40 MG tablet Take 1 tablet (40 mg total) by mouth daily. 12/12/23   Lowne Chase, Yvonne R, DO  diclofenac  Sodium (VOLTAREN ) 1 % GEL Apply 4 g topically 4 (four) times daily. Patient not taking: Reported on 01/30/2024 03/14/23   Antonio Cyndee Jamee JONELLE, DO  finasteride  (PROSCAR ) 5 MG tablet Take 1 tablet (5 mg total) by mouth daily. Patient not taking: Reported on 01/30/2024 09/22/22   Antonio Cyndee, Yvonne R, DO  fluticasone  (FLONASE ) 50 MCG/ACT nasal spray SPRAY 2 SPRAYS INTO EACH NOSTRIL EVERY DAY Patient not taking: Reported on 01/30/2024 08/12/21   Antonio Cyndee Jamee JONELLE, DO  hydrochlorothiazide  (HYDRODIURIL ) 25 MG tablet Take 1 tablet (25 mg total) by mouth daily. 03/18/24   Lowne  Cyndee Rockers R, DO  levocetirizine (XYZAL ) 5 MG tablet TAKE 1 TABLET BY MOUTH EVERY DAY IN THE EVENING Patient not taking: Reported on 01/30/2024 08/09/22   Antonio Cyndee Rockers JONELLE, DO  loratadine  (CLARITIN ) 10 MG tablet Take 1 tablet (10 mg total) by mouth daily. 04/07/22   Antonio Cyndee Rockers R, DO  losartan  (COZAAR ) 25 MG tablet TAKE 2 TABLETS BY MOUTH EVERY DAY 03/07/24   Antonio Cyndee, Yvonne R, DO  Multiple Vitamin (MULTI-VITAMIN) tablet Take 1 tablet by mouth daily. 11/29/19   [provider]   pantoprazole  (PROTONIX ) 40 MG tablet Take 1 tablet (40 mg total) by mouth 2 (two) times daily. 12/12/23   Lowne Chase, Yvonne R, DO  tolterodine (DETROL) 2 MG tablet  04/05/22   [provider]  traMADol  (ULTRAM ) 50 MG tablet Take 1 tablet (50 mg total) by mouth every 6 (six) hours as needed (pain). Patient not taking: Reported on 01/30/2024 04/11/22   Vonna Sharlet POUR, MD  triamcinolone  cream (KENALOG ) 0.1 % Apply 1 Application topically 2 (two) times daily. Patient not taking: Reported on 01/30/2024 01/27/23   Raspet, Rocky POUR, PA-C    Family History Family History  Problem Relation Age of Onset   Hypertension Mother    Liver cancer Father    Liver cancer Other    Colon cancer Neg Hx    Pancreatic cancer Neg Hx    Stomach cancer Neg Hx     Social History Social History   Tobacco Use   Smoking status: Former    Current packs/day: 0.00    Types: Cigarettes    Quit date: 02/26/2005    Years since quitting: 19.1   Smokeless tobacco: Never  Vaping Use   Vaping status: Never Used  Substance Use Topics   Alcohol use: Not Currently   Drug use: Never     Allergies   Lisinopril    Review of Systems Review of Systems  Constitutional:  Positive for chills.  Genitourinary:  Positive for dysuria and frequency.     Physical Exam Triage Vital Signs ED Triage Vitals  Encounter Vitals Group     BP 04/04/24 1204 124/72     Girls Systolic BP Percentile --      Girls Diastolic BP Percentile --      Boys Systolic BP Percentile --      Boys Diastolic BP Percentile --      Pulse Rate 04/04/24 1204 100     Resp 04/04/24 1204 17     Temp 04/04/24 1204 100.1 F (37.8 C)     Temp Source 04/04/24 1204 Oral     SpO2 04/04/24 1204 94 %     Weight --      Height --      Head Circumference --      Peak Flow --      Pain Score 04/04/24 1200 0     Pain Loc --      Pain Education --      Exclude from Growth Chart --    No data found.  Updated Vital Signs BP 124/72   Pulse  100   Temp 100.1 F (37.8 C) (Oral)   Resp 17   SpO2 94%   Visual Acuity Right Eye Distance:   Left Eye Distance:   Bilateral Distance:    Right Eye Near:   Left Eye Near:    Bilateral Near:     Physical Exam Vitals and nursing note reviewed.  Constitutional:  Appearance: Normal appearance.  HENT:     Head: Normocephalic and atraumatic.  Eyes:     Pupils: Pupils are equal, round, and reactive to light.  Cardiovascular:     Rate and Rhythm: Normal rate.  Pulmonary:     Effort: Pulmonary effort is normal.  Abdominal:     Tenderness: There is no right CVA tenderness or left CVA tenderness.  Skin:    General: Skin is warm and dry.  Neurological:     General: No focal deficit present.     Mental Status: He is alert and oriented to person, place, and time.  Psychiatric:        Mood and Affect: Mood normal.        Behavior: Behavior normal.      UC Treatments / Results  Labs (all labs ordered are listed, but only abnormal results are displayed) Labs Reviewed  POCT URINE DIPSTICK - Abnormal; Notable for the following components:      Result Value   Blood, UA small (*)    All other components within normal limits  URINE CULTURE  POC COVID19/FLU A&B COMBO   Comprehensive metabolic panel with GFR Order: [id number]  Status: Final result     Next appt: 04/15/2024 at 09:00 AM in [facility] LITTIE Legrand DOUGLAS, MD)     Dx: Atherosclerosis of aorta; Essential h...   Test Result Released: Yes (seen)     Messages: Seen   0 Result Notes     1 Patient Communication     View Follow-Up Encounter          Component Ref Range & Units (hover) 3 mo ago (12/12/23) 9 mo ago (06/13/23) 1 yr ago (03/14/23) 1 yr ago (09/22/22) 1 yr ago (04/11/22) 2 yr ago (01/13/22) 2 yr ago (07/30/21)  Sodium 141 139 138 139 138 R 140 141 R  Potassium 4.7 4.7 4.1 4.6 3.7 R 3.9 3.7 R  Chloride 103 102 99 105 98 R 102 103 R  CO2 30 31 31 28 21  R 31   Glucose, Bld 94 94 88 89 98 76 103  High  CM  BUN 27 High  21 21 19 24  R 21 21 R  Creatinine, Ser 1.16 1.01 0.98 0.97 1.31 High  R 1.04 1.00 R  Total Bilirubin 1.2 0.9 1.2 1.3 High   1.0   Alkaline Phosphatase 63 65 74 62  56   AST 29 22 23 20  21    ALT 32 25 23 24  20    Total Protein 7.2 6.9 7.4 6.6  7.2   Albumin 4.6 4.4 4.4 4.0  4.5   GFR 62.88 74.51 CM 77.39 CM 78.61 CM  72.65 CM   Comment: Calculated using the CKD-EPI Creatinine Equation (2021)  Calcium  9.6 9.6 9.8 8.9 9.7 R 9.6   Resulting Agency [place] [facility] [place] [facility] [place] [facility] [place] [facility] [place] [facility]        Specimen Collected: 12/12/23 13:52 Last Resulted: 12/13/23 11:09    EKG   Radiology No results found.  Procedures Procedures (including critical care time)  Medications Ordered in UC Medications - No data to display  Initial Impression / Assessment and Plan / UC Course  I have reviewed the triage vital signs and the nursing notes.  Pertinent labs & imaging results that were available during my care of the patient were reviewed by me and considered in my medical decision making (see chart for details).  I reviewed exam and symptoms with patient.  UA with small blood but no other signs of infection.  Will send urine culture.  COVID and flu run given patient's fever and chills but was negative.  Patient does have a history of BPH but denies history of prostatitis.  Denies difficulty starting or stopping the urine stream.  Symptoms will treat with Bactrim  to cover both urine and prostate infection while awaiting results I did advise he follow-up with his PCP in 2 days for recheck.  Strict ER precautions were reviewed and patient was understanding Final Clinical Impressions(s) / UC Diagnoses   Final diagnoses:  Dysuria  Chills  Urinary frequency     Discharge Instructions      Start Bactrim  twice daily for 14 days.  This will cover potential infection of your prostate as well as your urine.  The  clinic will send your urine for culture and contact you with these results if positive.  Lots of rest.  Please follow-up with your PCP in 1 to 2 days for recheck.  Please go to the ER for any worsening symptoms that occur prior to seeing your PCP.  This includes but is not limited to continued fever, continued urinary symptoms, inability to urinate or difficulty urinating, or any new concerns that arise.  I hope you feel better soon!     ED Prescriptions     Medication Sig Dispense Auth. Provider   sulfamethoxazole -trimethoprim  (BACTRIM  DS) 800-160 MG tablet Take 1 tablet by mouth 2 (two) times daily for 14 days. 28 tablet Taneya Conkel, Jodi R, NP      PDMP not reviewed this encounter.   Loreda Myla SAUNDERS, NP 04/04/24 1322

## 2024-04-06 LAB — URINE CULTURE: Culture: <10000 — AB

## 2024-04-08 ENCOUNTER — Ambulatory Visit (HOSPITAL_COMMUNITY): Payer: Self-pay

## 2024-04-10 DIAGNOSIS — H10413 Chronic giant papillary conjunctivitis, bilateral: Secondary | ICD-10-CM | POA: Diagnosis not present

## 2024-04-11 ENCOUNTER — Encounter: Payer: Self-pay | Admitting: Family Medicine

## 2024-04-11 ENCOUNTER — Ambulatory Visit (INDEPENDENT_AMBULATORY_CARE_PROVIDER_SITE_OTHER): Admitting: Family Medicine

## 2024-04-11 ENCOUNTER — Telehealth: Payer: Self-pay | Admitting: Gastroenterology

## 2024-04-11 VITALS — BP 112/68 | HR 74 | Temp 99.1°F | Resp 18 | Ht 60.0 in | Wt 130.0 lb

## 2024-04-11 DIAGNOSIS — R300 Dysuria: Secondary | ICD-10-CM

## 2024-04-11 LAB — POC URINALSYSI DIPSTICK (AUTOMATED)
Bilirubin, UA: NEGATIVE
Blood, UA: NEGATIVE
Glucose, UA: NEGATIVE
Ketones, UA: NEGATIVE
Nitrite, UA: NEGATIVE
Protein, UA: NEGATIVE
Spec Grav, UA: 1.01 (ref 1.010–1.025)
Urobilinogen, UA: 0.2 U/dL
pH, UA: 7 (ref 5.0–8.0)

## 2024-04-11 NOTE — Telephone Encounter (Signed)
 Good Afternoon Dr. Legrand,  I received a call from patient wishing to reschedule his 10/6 EGD due to health complications, patient states he's feeling unwell, especially after surgery. He is rescheduled for 11/10 @ 9 am. Please review and advise  Thank you

## 2024-04-11 NOTE — Progress Notes (Signed)
 Subjective:    Patient ID: Lance Oneal, male    DOB: Mar 02, 1951, 73 y.o.   MRN: [id number]  Chief Complaint  Patient presents with   UC follow up    Dysuria     HPI Patient is in today for f/u uc for uti.  Discussed the use of AI scribe software for clinical note transcription with the patient, who gave verbal consent to proceed.  History of Present Illness Lance Oneal is a 73 year old male who presents with concerns about dark yellow urine and insomnia after starting antibiotics.  Over the past week, he has noticed his urine being a very yellow color, which has been present for about three to four days. He attributes this to possibly drinking very little water each day, which he believes makes his body 'hot' and results in darker urine. When he drinks more water, the urine color improves. No back pain, stomach pain, or dysuria. He reports that when he drinks more water, the urine color improves.  He experienced a high fever for two days, although he is unsure of the exact temperature. He visited an urgent care center where a urine test was conducted, but no significant findings were reported. He was prescribed an antibiotic, ampicillin, for 40 days, which he took for two days.  After starting the antibiotic, he experienced insomnia, being unable to sleep for three consecutive nights. The inability to sleep occurred immediately after taking the medication, with no other symptoms such as rash or gastrointestinal upset. Due to the insomnia, he stopped taking the medication after two days.                                             Past Medical History:  Diagnosis Date   BPH (benign prostatic hyperplasia)    GERD (gastroesophageal reflux disease)    History of gastric ulcer    per pt 1970s or 1980s s/p  open surgery for stomach ulcer   Hyperlipidemia    Hypertension    Right hydrocele    Urinary urgency    refractory    Past Surgical History:   Procedure Laterality Date   ABDOMINAL SURGERY     per pt done approxl 1970s or 1980s  for stomach ulcer (has large midline incision)   HYDROCELE EXCISION Right 07/30/2021   Procedure: HYDROCELECTOMY ADULT AND RIGHT ORCHIOPEXY;  Surgeon: Alvaro Hummer, MD;  Location: [facility];  Service: Urology;  Laterality: Right;   TRANSURETHRAL RESECTION OF PROSTATE N/A 07/30/2021   Procedure: TRANSURETHRAL RESECTION OF THE PROSTATE (TURP);  Surgeon: Alvaro Hummer, MD;  Location: [facility];  Service: Urology;  Laterality: N/A;    Family History  Problem Relation Age of Onset   Hypertension Mother    Liver cancer Father    Liver cancer Other    Colon cancer Neg Hx    Pancreatic cancer Neg Hx    Stomach cancer Neg Hx     Social History   Socioeconomic History   Marital status: Married    Spouse name: Not on file   Number of children: 1   Years of education: Not on file   Highest education level: Not on file  Occupational History   Occupation: retired  Tobacco Use   Smoking status: Former    Current packs/day: 0.00    Types: Cigarettes    Quit  date: 02/26/2005    Years since quitting: 19.1   Smokeless tobacco: Never  Vaping Use   Vaping status: Never Used  Substance and Sexual Activity   Alcohol use: Not Currently   Drug use: Never   Sexual activity: Not Currently    Partners: Female  Other Topics Concern   Not on file  Social History Narrative   Not on file   Social Drivers of Health   Financial Resource Strain: Low Risk  (10/26/2021)   Overall Financial Resource Strain (CARDIA)    Difficulty of Paying Living Expenses: Not hard at all  Food Insecurity: No Food Insecurity (12/28/2022)   Hunger Vital Sign    Worried About Running Out of Food in the Last Year: Never true    Ran Out of Food in the Last Year: Never true  Transportation Needs: No Transportation Needs (12/28/2022)   PRAPARE - Administrator, Civil Service (Medical): No     Lack of Transportation (Non-Medical): No  Physical Activity: Sufficiently Active (12/28/2022)   Exercise Vital Sign    Days of Exercise per Week: 7 days    Minutes of Exercise per Session: 150+ min  Stress: No Stress Concern Present (10/26/2021)   [employer] of Occupational Health - Occupational Stress Questionnaire    Feeling of Stress : Not at all  Social Connections: Moderately Integrated (10/26/2021)   Social Connection and Isolation Panel    Frequency of Communication with Friends and Family: More than three times a week    Frequency of Social Gatherings with Friends and Family: More than three times a week    Attends Religious Services: Never    Database administrator or Organizations: Yes    Attends Banker Meetings: Never    Marital Status: Married  Catering manager Violence: Not At Risk (10/26/2021)   Humiliation, Afraid, Rape, and Kick questionnaire    Fear of Current or Ex-Partner: No    Emotionally Abused: No    Physically Abused: No    Sexually Abused: No    Outpatient Medications Prior to Visit  Medication Sig Dispense Refill   atorvastatin  (LIPITOR) 40 MG tablet Take 1 tablet (40 mg total) by mouth daily. 90 tablet 1   fluticasone  (FLONASE ) 50 MCG/ACT nasal spray SPRAY 2 SPRAYS INTO EACH NOSTRIL EVERY DAY 48 mL 2   hydrochlorothiazide  (HYDRODIURIL ) 25 MG tablet Take 1 tablet (25 mg total) by mouth daily. 90 tablet 1   loratadine  (CLARITIN ) 10 MG tablet Take 1 tablet (10 mg total) by mouth daily. 30 tablet 11   losartan  (COZAAR ) 25 MG tablet TAKE 2 TABLETS BY MOUTH EVERY DAY 180 tablet 0   Multiple Vitamin (MULTI-VITAMIN) tablet Take 1 tablet by mouth daily.     pantoprazole  (PROTONIX ) 40 MG tablet Take 1 tablet (40 mg total) by mouth 2 (two) times daily. 180 tablet 3   sulfamethoxazole -trimethoprim  (BACTRIM  DS) 800-160 MG tablet Take 1 tablet by mouth 2 (two) times daily for 14 days. 28 tablet 0   diclofenac  Sodium (VOLTAREN ) 1 % GEL Apply 4 g  topically 4 (four) times daily. (Patient not taking: Reported on 04/11/2024) 150 g 2   finasteride  (PROSCAR ) 5 MG tablet Take 1 tablet (5 mg total) by mouth daily. (Patient not taking: Reported on 04/11/2024) 90 tablet 1   levocetirizine (XYZAL ) 5 MG tablet TAKE 1 TABLET BY MOUTH EVERY DAY IN THE EVENING (Patient not taking: Reported on 04/11/2024) 90 tablet 1   tolterodine (DETROL) 2 MG tablet  (  Patient not taking: Reported on 04/11/2024)     traMADol  (ULTRAM ) 50 MG tablet Take 1 tablet (50 mg total) by mouth every 6 (six) hours as needed (pain). (Patient not taking: Reported on 04/11/2024) 12 tablet 0   triamcinolone  cream (KENALOG ) 0.1 % Apply 1 Application topically 2 (two) times daily. (Patient not taking: Reported on 04/11/2024) 30 g 1   No facility-administered medications prior to visit.    Allergies  Allergen Reactions   Lisinopril      Cough    Sulfa  Antibiotics Other (See Comments)    insomnia    Review of Systems  Constitutional:  Negative for fever and malaise/fatigue.  HENT:  Negative for congestion.   Eyes:  Negative for blurred vision.  Respiratory:  Negative for cough and shortness of breath.   Cardiovascular:  Negative for chest pain, palpitations and leg swelling.  Gastrointestinal:  Negative for abdominal pain, blood in stool, nausea and vomiting.  Genitourinary:  Negative for dysuria, flank pain, frequency, hematuria and urgency.  Musculoskeletal:  Negative for back pain and falls.  Skin:  Negative for rash.  Neurological:  Negative for dizziness, loss of consciousness and headaches.  Endo/Heme/Allergies:  Negative for environmental allergies.  Psychiatric/Behavioral:  Negative for depression. The patient is not nervous/anxious.        Objective:    Physical Exam Vitals and nursing note reviewed.  Constitutional:      General: He is not in acute distress.    Appearance: Normal appearance. He is well-developed.  HENT:     Head: Normocephalic and atraumatic.   Eyes:     General: No scleral icterus.       Right eye: No discharge.        Left eye: No discharge.  Cardiovascular:     Rate and Rhythm: Normal rate and regular rhythm.     Heart sounds: No murmur heard. Pulmonary:     Effort: Pulmonary effort is normal. No respiratory distress.     Breath sounds: Normal breath sounds.  Musculoskeletal:        General: Normal range of motion.     Cervical back: Normal range of motion and neck supple.     Right lower leg: No edema.     Left lower leg: No edema.  Skin:    General: Skin is warm and dry.  Neurological:     Mental Status: He is alert and oriented to person, place, and time.  Psychiatric:        Mood and Affect: Mood normal.        Behavior: Behavior normal.        Thought Content: Thought content normal.        Judgment: Judgment normal.     BP 112/68 (BP Location: Left Arm, Patient Position: Sitting, Cuff Size: Normal)   Pulse 74   Temp 99.1 F (37.3 C) (Oral)   Resp 18   Ht 5' (1.524 m)   Wt 130 lb (59 kg)   SpO2 99%   BMI 25.39 kg/m  Wt Readings from Last 3 Encounters:  04/11/24 130 lb (59 kg)  01/30/24 123 lb 4 oz (55.9 kg)  12/12/23 124 lb 9.6 oz (56.5 kg)    Diabetic Foot Exam - Simple   No data filed    Lab Results  Component Value Date   WBC 8.0 12/12/2023   HGB 14.8 12/12/2023   HCT 44.5 12/12/2023   PLT 309.0 12/12/2023   GLUCOSE 94 12/12/2023   CHOL 143 12/12/2023  TRIG 106.0 12/12/2023   HDL 44.90 12/12/2023   LDLDIRECT 97.0 01/13/2022   LDLCALC 77 12/12/2023   ALT 32 12/12/2023   AST 29 12/12/2023   NA 141 12/12/2023   K 4.7 12/12/2023   CL 103 12/12/2023   CREATININE 1.16 12/12/2023   BUN 27 (H) 12/12/2023   CO2 30 12/12/2023   TSH 0.63 12/12/2023   PSA 0.42 06/13/2023    Lab Results  Component Value Date   TSH 0.63 12/12/2023   Lab Results  Component Value Date   WBC 8.0 12/12/2023   HGB 14.8 12/12/2023   HCT 44.5 12/12/2023   MCV 90.0 12/12/2023   PLT 309.0 12/12/2023    Lab Results  Component Value Date   NA 141 12/12/2023   K 4.7 12/12/2023   CO2 30 12/12/2023   GLUCOSE 94 12/12/2023   BUN 27 (H) 12/12/2023   CREATININE 1.16 12/12/2023   BILITOT 1.2 12/12/2023   ALKPHOS 63 12/12/2023   AST 29 12/12/2023   ALT 32 12/12/2023   PROT 7.2 12/12/2023   ALBUMIN 4.6 12/12/2023   CALCIUM  9.6 12/12/2023   EGFR 59 (L) 04/11/2022   GFR 62.88 12/12/2023   Lab Results  Component Value Date   CHOL 143 12/12/2023   Lab Results  Component Value Date   HDL 44.90 12/12/2023   Lab Results  Component Value Date   LDLCALC 77 12/12/2023   Lab Results  Component Value Date   TRIG 106.0 12/12/2023   Lab Results  Component Value Date   CHOLHDL 3 12/12/2023   No results found for: HGBA1C     Assessment & Plan:  Dysuria -     POCT Urinalysis Dipstick (Automated) -     Urine Culture  Assessment and Plan Assessment & Plan Dysuria   He experiences intermittent dysuria with recent dark yellow urine and fever. Ampicillin was discontinued after two days due to insomnia. Urinalysis shows minimal bacteria, and a quick test indicates no infection. Differential diagnosis includes dehydration from low water intake, with improvement in urine color upon increased hydration. Send a urine sample for culture to confirm the absence of infection. Advise increased water intake to maintain hydration and improve urine color. Hold antibiotics until urine culture results are available. Contact him on Monday with urine culture results.    Kalkidan Caudell R Lowne Chase, DO

## 2024-04-11 NOTE — Telephone Encounter (Signed)
 Some more clinical information would be helpful, particularly regarding his report of having had a surgery.  I do not see any recent surgical procedures for him in this electronic chart.  He went to an urgent care for possible UTI about a week ago and saw his primary care provider for this yesterday.  VEAR Brand MD

## 2024-04-12 ENCOUNTER — Ambulatory Visit: Payer: Self-pay | Admitting: Family Medicine

## 2024-04-12 LAB — URINE CULTURE
MICRO NUMBER:: 17047729
Result:: NO GROWTH
SPECIMEN QUALITY:: ADEQUATE

## 2024-04-15 ENCOUNTER — Encounter: Admitting: Gastroenterology

## 2024-04-28 ENCOUNTER — Ambulatory Visit
Admission: EM | Admit: 2024-04-28 | Discharge: 2024-04-28 | Disposition: A | Discharge status: Home / Self Care | Attending: Family Medicine | Admitting: Family Medicine

## 2024-04-28 ENCOUNTER — Other Ambulatory Visit: Payer: Self-pay

## 2024-04-28 DIAGNOSIS — S0501XA Injury of conjunctiva and corneal abrasion without foreign body, right eye, initial encounter: Secondary | ICD-10-CM

## 2024-04-28 MED ORDER — OFLOXACIN 0.3 % OP SOLN: 1.0000 [drp] | Freq: Four times a day (QID) | OPHTHALMIC | 0 refills | Status: AC

## 2024-04-28 NOTE — Discharge Instructions (Addendum)
 Start ofloxacin antibiotic eyedrops 4 times a day for 5 days.  Please follow-up with your PCP or ophthalmology if your symptoms are not improving.  Please go to the ER if you develop any worsening symptoms.  Hope you feel better soon!

## 2024-04-28 NOTE — ED Provider Notes (Signed)
 [facility]    CSN: [id number] Arrival date & time: 04/28/24  1328      History   Chief Complaint Chief Complaint  Patient presents with   Eye Problem    HPI Lance Oneal is a 73 y.o. male presents for eye pain.  Patient states today he opened his front door and the wind blew something into his eye.  States he rubbed it and since it has felt like he has something in the eye.  Denies any drainage from the eye, redness, visual changes.  No glasses or contacts.  He tried to rinse it out by using eyedrops and it helped a little bit.  No other concerns at this time.   Eye Problem   Past Medical History:  Diagnosis Date   BPH (benign prostatic hyperplasia)    GERD (gastroesophageal reflux disease)    History of gastric ulcer    per pt 1970s or 1980s s/p  open surgery for stomach ulcer   Hyperlipidemia    Hypertension    Right hydrocele    Urinary urgency    refractory    Patient Active Problem List   Diagnosis Date Noted   Bronchitis 04/07/2022   Prostatic hyperplasia 07/30/2021   Dyspepsia 04/09/2018   Seasonal allergic rhinitis due to pollen 04/09/2018   Benign prostatic hyperplasia (BPH) with urinary urgency 02/27/2017   Pharyngitis 09/26/2016   Cough 09/06/2016   Atherosclerosis of aorta 09/06/2016   Preventative health care 08/30/2015   History of BPH 02/10/2015   Tooth pain 02/10/2015   Hyperlipidemia 06/07/2013   Urinary frequency 04/17/2013   Pain in left testicle 04/17/2013   Essential hypertension 01/01/2013    Past Surgical History:  Procedure Laterality Date   ABDOMINAL SURGERY     per pt done approxl 1970s or 1980s  for stomach ulcer (has large midline incision)   EYE SURGERY     HYDROCELE EXCISION Right 07/30/2021   Procedure: HYDROCELECTOMY ADULT AND RIGHT ORCHIOPEXY;  Surgeon: Alvaro Hummer, MD;  Location: [facility];  Service: Urology;  Laterality: Right;   TRANSURETHRAL RESECTION OF PROSTATE N/A 07/30/2021   Procedure:  TRANSURETHRAL RESECTION OF THE PROSTATE (TURP);  Surgeon: Alvaro Hummer, MD;  Location: [facility];  Service: Urology;  Laterality: N/A;       Home Medications    Prior to Admission medications   Medication Sig Start Date End Date Taking? Authorizing Provider  ofloxacin (OCUFLOX) 0.3 % ophthalmic solution Place 1 drop into the right eye 4 (four) times daily for 5 days. 04/28/24 05/03/24 Yes Jackee Glasner, Jodi R, NP  atorvastatin  (LIPITOR) 40 MG tablet Take 1 tablet (40 mg total) by mouth daily. 12/12/23   Lowne Chase, Yvonne R, DO  diclofenac  Sodium (VOLTAREN ) 1 % GEL Apply 4 g topically 4 (four) times daily. Patient not taking: Reported on 04/11/2024 03/14/23   [name], [name], DO  finasteride  (PROSCAR ) 5 MG tablet Take 1 tablet (5 mg total) by mouth daily. Patient not taking: Reported on 04/11/2024 09/22/22   [name], Yvonne R, DO  fluticasone  (FLONASE ) 50 MCG/ACT nasal spray SPRAY 2 SPRAYS INTO EACH NOSTRIL EVERY DAY 08/12/21   [name], Yvonne R, DO  hydrochlorothiazide  (HYDRODIURIL ) 25 MG tablet Take 1 tablet (25 mg total) by mouth daily. 03/18/24   [name] Jamee R, DO  levocetirizine (XYZAL ) 5 MG tablet TAKE 1 TABLET BY MOUTH EVERY DAY IN THE EVENING Patient not taking: Reported on 04/11/2024 08/09/22   [name] Jamee  R, DO  loratadine  (CLARITIN ) 10 MG tablet Take 1 tablet (10 mg total) by mouth daily. 04/07/22   Antonio Cyndee Rockers R, DO  losartan  (COZAAR ) 25 MG tablet TAKE 2 TABLETS BY MOUTH EVERY DAY 03/07/24   Antonio Cyndee, Yvonne R, DO  Multiple Vitamin (MULTI-VITAMIN) tablet Take 1 tablet by mouth daily. 11/29/19   [provider]  pantoprazole  (PROTONIX ) 40 MG tablet Take 1 tablet (40 mg total) by mouth 2 (two) times daily. 12/12/23   Lowne Chase, Yvonne R, DO  tolterodine (DETROL) 2 MG tablet  04/05/22   [provider]  traMADol  (ULTRAM ) 50 MG tablet Take 1 tablet (50 mg total) by mouth every 6 (six) hours as needed (pain). Patient not taking:  Reported on 04/11/2024 04/11/22   Vonna Sharlet POUR, MD  triamcinolone  cream (KENALOG ) 0.1 % Apply 1 Application topically 2 (two) times daily. Patient not taking: Reported on 04/11/2024 01/27/23   Raspet, Rocky POUR, PA-C    Family History Family History  Problem Relation Age of Onset   Hypertension Mother    Liver cancer Father    Liver cancer Other    Colon cancer Neg Hx    Pancreatic cancer Neg Hx    Stomach cancer Neg Hx     Social History Social History   Tobacco Use   Smoking status: Former    Current packs/day: 0.00    Types: Cigarettes    Quit date: 02/26/2005    Years since quitting: 19.1   Smokeless tobacco: Never  Vaping Use   Vaping status: Never Used  Substance Use Topics   Alcohol use: Not Currently   Drug use: Never     Allergies   Lisinopril  and Sulfa  antibiotics   Review of Systems Review of Systems  Eyes:        Foreign body sensation of right eye     Physical Exam Triage Vital Signs ED Triage Vitals  Encounter Vitals Group     BP 04/28/24 1517 128/79     Girls Systolic BP Percentile --      Girls Diastolic BP Percentile --      Boys Systolic BP Percentile --      Boys Diastolic BP Percentile --      Pulse Rate 04/28/24 1517 78     Resp 04/28/24 1517 16     Temp 04/28/24 1517 97.9 F (36.6 C)     Temp Source 04/28/24 1517 Oral     SpO2 04/28/24 1517 97 %     Weight --      Height --      Head Circumference --      Peak Flow --      Pain Score 04/28/24 1514 0     Pain Loc --      Pain Education --      Exclude from Growth Chart --    No data found.  Updated Vital Signs BP 128/79   Pulse 78   Temp 97.9 F (36.6 C) (Oral)   Resp 16   SpO2 97%   Visual Acuity Right Eye Distance:   Left Eye Distance:   Bilateral Distance:    Right Eye Near:   Left Eye Near:    Bilateral Near:     Physical Exam Vitals and nursing note reviewed.  Constitutional:      General: He is not in acute distress.    Appearance: Normal appearance.  He is not ill-appearing.  HENT:     Head: Normocephalic  and atraumatic.  Eyes:     General: Lids are normal.        Right eye: No foreign body, discharge or hordeolum.     Conjunctiva/sclera:     Right eye: Right conjunctiva is injected. No chemosis, exudate or hemorrhage.    Pupils: Pupils are equal, round, and reactive to light.     Right eye: Corneal abrasion and fluorescein uptake present.      Comments: Very slight injection of the right eye with small corneal abrasion at the 6 o'clock position.  No foreign bodies on exam.  Cardiovascular:     Rate and Rhythm: Normal rate.  Pulmonary:     Effort: Pulmonary effort is normal.  Skin:    General: Skin is warm and dry.  Neurological:     General: No focal deficit present.     Mental Status: He is alert and oriented to person, place, and time.  Psychiatric:        Mood and Affect: Mood normal.        Behavior: Behavior normal.      UC Treatments / Results  Labs (all labs ordered are listed, but only abnormal results are displayed) Labs Reviewed - No data to display  EKG   Radiology No results found.  Procedures Procedures (including critical care time)  Medications Ordered in UC Medications - No data to display  Initial Impression / Assessment and Plan / UC Course  I have reviewed the triage vital signs and the nursing notes.  Pertinent labs & imaging results that were available during my care of the patient were reviewed by me and considered in my medical decision making (see chart for details).     Reviewed exam and symptoms with patient.  No red flags.  Will start antibiotic eyedrops for corneal abrasion.  Advised follow-up with PCP or ophthalmology if symptoms do not improve.  ER precautions reviewed Final Clinical Impressions(s) / UC Diagnoses   Final diagnoses:  Abrasion of right cornea, initial encounter     Discharge Instructions      Start ofloxacin antibiotic eyedrops 4 times a day for 5 days.   Please follow-up with your PCP or ophthalmology if your symptoms are not improving.  Please go to the ER if you develop any worsening symptoms.  Hope you feel better soon!    ED Prescriptions     Medication Sig Dispense Auth. Provider   ofloxacin (OCUFLOX) 0.3 % ophthalmic solution Place 1 drop into the right eye 4 (four) times daily for 5 days. 5 mL Gera Inboden, Jodi R, NP      PDMP not reviewed this encounter.   Loreda Myla SAUNDERS, NP 04/28/24 1540

## 2024-04-28 NOTE — ED Triage Notes (Signed)
 Pt states he was on the street and the wind blew and  dust or something got into my right eye. Pt denies vision issues out of eye. I do not notice any discoloration of slera

## 2024-04-28 NOTE — ED Notes (Signed)
I  used interpreter to triage pt

## 2024-05-04 ENCOUNTER — Ambulatory Visit
Admission: EM | Admit: 2024-05-04 | Discharge: 2024-05-04 | Disposition: A | Discharge status: Home / Self Care | Attending: Family Medicine | Admitting: Family Medicine

## 2024-05-04 DIAGNOSIS — S0500XD Injury of conjunctiva and corneal abrasion without foreign body, unspecified eye, subsequent encounter: Secondary | ICD-10-CM | POA: Diagnosis not present

## 2024-05-04 MED ORDER — OFLOXACIN 0.3 % OP SOLN: 1.0000 [drp] | Freq: Four times a day (QID) | OPHTHALMIC | 0 refills | Status: AC

## 2024-05-04 NOTE — Discharge Instructions (Addendum)
 Continue to use ofloxacin ophthalmic solution.  Follow-up with Dr. Fleeta, he is an eye specialist contracted with [facility] to help with situations like this.

## 2024-05-04 NOTE — ED Provider Notes (Signed)
 [facility]  Note:  This document was prepared using Conservation officer, historic buildings and may include unintentional dictation errors.  MRN: [id number] DOB: 1950/10/18  Subjective:   Lance Oneal is a 73 y.o. male presenting for recheck on a corneal abrasion of the right eye.  Patient was seen on the 19th and found to have a corneal abrasion at the 6 o'clock position per chart review.  She was advised to start ofloxacin ophthalmic solution and follow-up ophthalmology.  Patient finished the ofloxacin ophthalmic solution.  Reports that he has had significant improvement with the feels intermittently a gritty sensation.  No new injuries, incidents that would have led to another corneal abrasion.  No current facility-administered medications for this encounter.  Current Outpatient Medications:    atorvastatin  (LIPITOR) 40 MG tablet, Take 1 tablet (40 mg total) by mouth daily., Disp: 90 tablet, Rfl: 1   diclofenac  Sodium (VOLTAREN ) 1 % GEL, Apply 4 g topically 4 (four) times daily. (Patient not taking: Reported on 04/11/2024), Disp: 150 g, Rfl: 2   finasteride  (PROSCAR ) 5 MG tablet, Take 1 tablet (5 mg total) by mouth daily. (Patient not taking: Reported on 04/11/2024), Disp: 90 tablet, Rfl: 1   fluticasone  (FLONASE ) 50 MCG/ACT nasal spray, SPRAY 2 SPRAYS INTO EACH NOSTRIL EVERY DAY, Disp: 48 mL, Rfl: 2   hydrochlorothiazide  (HYDRODIURIL ) 25 MG tablet, Take 1 tablet (25 mg total) by mouth daily., Disp: 90 tablet, Rfl: 1   levocetirizine (XYZAL ) 5 MG tablet, TAKE 1 TABLET BY MOUTH EVERY DAY IN THE EVENING (Patient not taking: Reported on 04/11/2024), Disp: 90 tablet, Rfl: 1   loratadine  (CLARITIN ) 10 MG tablet, Take 1 tablet (10 mg total) by mouth daily., Disp: 30 tablet, Rfl: 11   losartan  (COZAAR ) 25 MG tablet, TAKE 2 TABLETS BY MOUTH EVERY DAY, Disp: 180 tablet, Rfl: 0   Multiple Vitamin (MULTI-VITAMIN) tablet, Take 1 tablet by mouth daily., Disp: , Rfl:    pantoprazole   (PROTONIX ) 40 MG tablet, Take 1 tablet (40 mg total) by mouth 2 (two) times daily., Disp: 180 tablet, Rfl: 3   tolterodine (DETROL) 2 MG tablet, , Disp: , Rfl:    traMADol  (ULTRAM ) 50 MG tablet, Take 1 tablet (50 mg total) by mouth every 6 (six) hours as needed (pain). (Patient not taking: Reported on 04/11/2024), Disp: 12 tablet, Rfl: 0   triamcinolone  cream (KENALOG ) 0.1 %, Apply 1 Application topically 2 (two) times daily. (Patient not taking: Reported on 04/11/2024), Disp: 30 g, Rfl: 1   Allergies  Allergen Reactions   Lisinopril      Cough    Sulfa  Antibiotics Other (See Comments)    insomnia    Past Medical History:  Diagnosis Date   BPH (benign prostatic hyperplasia)    GERD (gastroesophageal reflux disease)    History of gastric ulcer    per pt 1970s or 1980s s/p  open surgery for stomach ulcer   Hyperlipidemia    Hypertension    Right hydrocele    Urinary urgency    refractory     Past Surgical History:  Procedure Laterality Date   ABDOMINAL SURGERY     per pt done approxl 1970s or 1980s  for stomach ulcer (has large midline incision)   EYE SURGERY     HYDROCELE EXCISION Right 07/30/2021   Procedure: HYDROCELECTOMY ADULT AND RIGHT ORCHIOPEXY;  Surgeon: Alvaro Hummer, MD;  Location: [facility];  Service: Urology;  Laterality: Right;   TRANSURETHRAL RESECTION OF PROSTATE N/A 07/30/2021  Procedure: TRANSURETHRAL RESECTION OF THE PROSTATE (TURP);  Surgeon: Alvaro Hummer, MD;  Location: [facility];  Service: Urology;  Laterality: N/A;    Family History  Problem Relation Age of Onset   Hypertension Mother    Liver cancer Father    Liver cancer Other    Colon cancer Neg Hx    Pancreatic cancer Neg Hx    Stomach cancer Neg Hx     Social History   Tobacco Use   Smoking status: Former    Current packs/day: 0.00    Types: Cigarettes    Quit date: 02/26/2005    Years since quitting: 19.1   Smokeless tobacco: Never  Vaping Use    Vaping status: Never Used  Substance Use Topics   Alcohol use: Not Currently   Drug use: Never    ROS   Objective:   Vitals: BP 125/73 (BP Location: Right Arm)   Pulse 80   Temp 98.1 F (36.7 C) (Oral)   Resp 16   SpO2 94%   Physical Exam Constitutional:      General: He is not in acute distress.    Appearance: Normal appearance. He is well-developed and normal weight. He is not ill-appearing, toxic-appearing or diaphoretic.  HENT:     Head: Normocephalic and atraumatic.     Right Ear: External ear normal.     Left Ear: External ear normal.     Nose: Nose normal.     Mouth/Throat:     Pharynx: Oropharynx is clear.  Eyes:     General: Lids are everted, no foreign bodies appreciated. No scleral icterus.       Right eye: No foreign body, discharge or hordeolum.        Left eye: No foreign body, discharge or hordeolum.     Extraocular Movements: Extraocular movements intact.     Conjunctiva/sclera:     Right eye: Right conjunctiva is not injected. No chemosis, exudate or hemorrhage.    Left eye: Left conjunctiva is not injected. No chemosis, exudate or hemorrhage. Cardiovascular:     Rate and Rhythm: Normal rate.  Pulmonary:     Effort: Pulmonary effort is normal.  Musculoskeletal:     Cervical back: Normal range of motion.  Neurological:     Mental Status: He is alert and oriented to person, place, and time.  Psychiatric:        Mood and Affect: Mood normal.        Behavior: Behavior normal.        Thought Content: Thought content normal.        Judgment: Judgment normal.     Assessment and Plan :   PDMP not reviewed this encounter.  1. Corneal abrasion, subsequent encounter    I did not stain the eye again as he has achieved clinical improvement.  I did offer more ofloxacin otic as he has already finished it.  Recommended he follow-up with an ophthalmologist as previously recommended.  Counseled patient on potential for adverse effects with medications  prescribed/recommended today, ER and return-to-clinic precautions discussed, patient verbalized understanding.    Christopher Savannah, [place] 05/04/24 [phone]

## 2024-05-04 NOTE — ED Triage Notes (Signed)
 Pt present for a follow up on his rt eye. Pt states he had glass in his eye and wants to make sure his eye is okay now.

## 2024-05-06 DIAGNOSIS — H04123 Dry eye syndrome of bilateral lacrimal glands: Secondary | ICD-10-CM | POA: Diagnosis not present

## 2024-05-16 ENCOUNTER — Ambulatory Visit: Admitting: Family Medicine

## 2024-05-20 ENCOUNTER — Telehealth: Payer: Self-pay | Admitting: *Deleted

## 2024-05-20 ENCOUNTER — Encounter: Payer: Self-pay | Admitting: Gastroenterology

## 2024-05-20 ENCOUNTER — Ambulatory Visit: Admitting: Gastroenterology

## 2024-05-20 VITALS — BP 119/54 | HR 80 | Temp 98.4°F | Resp 19 | Ht 60.0 in | Wt 128.4 lb

## 2024-05-20 DIAGNOSIS — Z9889 Other specified postprocedural states: Secondary | ICD-10-CM

## 2024-05-20 DIAGNOSIS — Z8711 Personal history of peptic ulcer disease: Secondary | ICD-10-CM | POA: Diagnosis not present

## 2024-05-20 DIAGNOSIS — K2951 Unspecified chronic gastritis with bleeding: Secondary | ICD-10-CM | POA: Diagnosis not present

## 2024-05-20 DIAGNOSIS — K31A11 Gastric intestinal metaplasia without dysplasia, involving the antrum: Secondary | ICD-10-CM

## 2024-05-20 DIAGNOSIS — R12 Heartburn: Secondary | ICD-10-CM

## 2024-05-20 DIAGNOSIS — K3189 Other diseases of stomach and duodenum: Secondary | ICD-10-CM

## 2024-05-20 DIAGNOSIS — K2950 Unspecified chronic gastritis without bleeding: Secondary | ICD-10-CM | POA: Diagnosis not present

## 2024-05-20 DIAGNOSIS — Z8719 Personal history of other diseases of the digestive system: Secondary | ICD-10-CM

## 2024-05-20 DIAGNOSIS — K2970 Gastritis, unspecified, without bleeding: Secondary | ICD-10-CM

## 2024-05-20 MED ORDER — SODIUM CHLORIDE 0.9 % IV SOLN: 500.0000 mL | INTRAVENOUS | Status: DC

## 2024-05-20 NOTE — Telephone Encounter (Signed)
 Contact Type Call Who Is Calling Patient / Member / Family / Caregiver Caller Name Declined to provide Caller Phone Number [phone] Call Type Message Only Information Provided Reason for Call Request to Schedule Office Appointment Initial Comment Caller states he needs to make an appt. Patient request to speak to RN No Translation No Disp. Time Titus Time) Disposition Final User 05/18/2024 8:40:51 AM General Information Provided Yes [name] Final Disposition 05/18/2024 8:40:51 AM General Information Provided Yes [name]

## 2024-05-20 NOTE — Progress Notes (Signed)
 Sedate, gd SR, tolerated procedure well, VSS, report to RN

## 2024-05-20 NOTE — Op Note (Addendum)
 [facility] Patient Name: Lance Oneal Procedure Date: 05/20/2024 9:48 AM MRN: [id number] Endoscopist: Victory L. Legrand , MD, [id number] Age: 73 Referring MD:  Date of Birth: [birth year] Gender: Male Account #: [id number] Procedure:                Upper GI endoscopy Indications:              Esophageal reflux symptoms that persist despite                            appropriate therapy, Helicobacter pylori status not                            known, Personal history of peptic ulcer disease                            (unknown DU surgery decades ago) Medicines:                Monitored Anesthesia Care Procedure:                Pre-Anesthesia Assessment:                           - Prior to the procedure, a History and Physical                            was performed, and patient medications and                            allergies were reviewed. The patient's tolerance of                            previous anesthesia was also reviewed. The risks                            and benefits of the procedure and the sedation                            options and risks were discussed with the patient.                            All questions were answered, and informed consent                            was obtained. Prior Anticoagulants: The patient has                            taken no anticoagulant or antiplatelet agents. ASA                            Grade Assessment: II - A patient with mild systemic                            disease. After reviewing the risks and benefits,  the patient was deemed in satisfactory condition to                            undergo the procedure.                           After obtaining informed consent, the endoscope was                            passed under direct vision. Throughout the                            procedure, the patient's blood pressure, pulse, and                            oxygen saturations were  monitored continuously. The                            GIF [id number] #[id number] was introduced through the                            mouth, and advanced to the second part of duodenum.                            The upper GI endoscopy was accomplished without                            difficulty. The patient tolerated the procedure                            well. Scope In: Scope Out: Findings:                 The esophagus was normal.                           Patchy moderate inflammation characterized by                            adherent blood, congestion (edema) and erythema was                            found in the gastric body and in the gastric                            antrum. Biopsies were taken with a cold forceps for                            histology. (Antrum and body in same pathology jar                            to rule out H. pylori)                           Localized nodular mucosa was found in the cardia.                            (  See photo) -normal overlying mucosa-no biopsies                            taken.                           The exam of the stomach was otherwise normal.                           The cardia and gastric fundus were normal on                            retroflexion. (Hill grade 3)                           There was evidence of a widely patent previous                            surgical intervention in the duodenal bulb, with                            the appearance of an anterior outpouching. This was                            characterized by healthy appearing mucosa. No                            stricture, easy scope passage to second portion.                           The exam of the duodenum was otherwise normal. Complications:            No immediate complications. Estimated Blood Loss:     Estimated blood loss was minimal. Impression:               - Normal esophagus.                           - Gastritis. Biopsied.                            - Nodular mucosa in the cardia.                           - Widely patent previous surgical intervention,                            characterized by healthy appearing mucosa was found                            in the duodenum (bulb). No apparent resection,                            probable patch repair. Recommendation:           - Patient has a contact number available for  emergencies. The signs and symptoms of potential                            delayed complications were discussed with the                            patient. Return to normal activities tomorrow.                            Written discharge instructions were provided to the                            patient.                           - Resume previous diet.                           - Continue present medications.                           - Await pathology results. Jeanean Hollett L. Legrand, MD 05/20/2024 10:19:01 AM This report has been signed electronically.

## 2024-05-20 NOTE — Progress Notes (Signed)
 Called to room to assist during endoscopic procedure.  Patient ID and intended procedure confirmed with present staff. Received instructions for my participation in the procedure from the performing physician.

## 2024-05-20 NOTE — Progress Notes (Signed)
 Pt's states no medical or surgical changes since previsit or office visit.

## 2024-05-20 NOTE — Progress Notes (Signed)
 History and Physical:  This patient presents for endoscopic testing for: Encounter Diagnoses  Name Primary?   History of duodenal ulcer Yes   Heartburn     73 year old man here today for evaluation of GERD symptoms (longstanding, controlled on twice daily PPI) as well as previous history of an unknown surgery for duodenal ulcer and unknown H. pylori status.  Further clinical details on the 01/30/2024 office note by our APP. No changes to patient's GI symptoms since then.  He has had a UTI and corneal abrasion since that last visit. Patient is otherwise without complaints or active issues today.  (Vietnamese interpreter present for preprocedure evaluation)  Past Medical History: Past Medical History:  Diagnosis Date   BPH (benign prostatic hyperplasia)    GERD (gastroesophageal reflux disease)    History of gastric ulcer    per pt 1970s or 1980s s/p  open surgery for stomach ulcer   Hyperlipidemia    Hypertension    Right hydrocele    Urinary urgency    refractory     Past Surgical History: Past Surgical History:  Procedure Laterality Date   ABDOMINAL SURGERY     per pt done approxl 1970s or 1980s  for stomach ulcer (has large midline incision)   EYE SURGERY     HYDROCELE EXCISION Right 07/30/2021   Procedure: HYDROCELECTOMY ADULT AND RIGHT ORCHIOPEXY;  Surgeon: Alvaro Hummer, MD;  Location: [facility];  Service: Urology;  Laterality: Right;   TRANSURETHRAL RESECTION OF PROSTATE N/A 07/30/2021   Procedure: TRANSURETHRAL RESECTION OF THE PROSTATE (TURP);  Surgeon: Alvaro Hummer, MD;  Location: [facility];  Service: Urology;  Laterality: N/A;    Allergies: Allergies  Allergen Reactions   Lisinopril  Other (See Comments)    Cough    Sulfa  Antibiotics Other (See Comments)    insomnia    Outpatient Meds: Current Outpatient Medications  Medication Sig Dispense Refill   hydrochlorothiazide  (HYDRODIURIL ) 25 MG tablet Take 1 tablet (25 mg  total) by mouth daily. 90 tablet 1   losartan  (COZAAR ) 25 MG tablet TAKE 2 TABLETS BY MOUTH EVERY DAY 180 tablet 0   ofloxacin (OCUFLOX) 0.3 % ophthalmic solution Place 1 drop into the right eye 4 (four) times daily. 5 mL 0   pantoprazole  (PROTONIX ) 40 MG tablet Take 1 tablet (40 mg total) by mouth 2 (two) times daily. 180 tablet 3   atorvastatin  (LIPITOR) 40 MG tablet Take 1 tablet (40 mg total) by mouth daily. 90 tablet 1   diclofenac  Sodium (VOLTAREN ) 1 % GEL Apply 4 g topically 4 (four) times daily. (Patient not taking: No sig reported) 150 g 2   finasteride  (PROSCAR ) 5 MG tablet Take 1 tablet (5 mg total) by mouth daily. (Patient not taking: No sig reported) 90 tablet 1   fluticasone  (FLONASE ) 50 MCG/ACT nasal spray SPRAY 2 SPRAYS INTO EACH NOSTRIL EVERY DAY (Patient not taking: Reported on 05/20/2024) 48 mL 2   levocetirizine (XYZAL ) 5 MG tablet TAKE 1 TABLET BY MOUTH EVERY DAY IN THE EVENING (Patient not taking: No sig reported) 90 tablet 1   loratadine  (CLARITIN ) 10 MG tablet Take 1 tablet (10 mg total) by mouth daily. 30 tablet 11   Multiple Vitamin (MULTI-VITAMIN) tablet Take 1 tablet by mouth daily.     tolterodine (DETROL) 2 MG tablet  (Patient not taking: No sig reported)     traMADol  (ULTRAM ) 50 MG tablet Take 1 tablet (50 mg total) by mouth every 6 (six) hours as needed (pain). (  Patient not taking: Reported on 01/30/2024) 12 tablet 0   triamcinolone  cream (KENALOG ) 0.1 % Apply 1 Application topically 2 (two) times daily. (Patient not taking: Reported on 01/30/2024) 30 g 1   Current Facility-Administered Medications  Medication Dose Route Frequency Provider Last Rate Last Admin   0.9 %  sodium chloride  infusion  500 mL Intravenous Continuous Danis, Victory CROME III, MD          ___________________________________________________________________ Objective   Exam:  BP 135/71   Pulse 88   Temp 98.4 F (36.9 C) (Temporal)   Ht 5' (1.524 m)   Wt 128 lb 6.4 oz (58.2 kg)   SpO2 98%    BMI 25.08 kg/m   CV: regular , S1/S2 Resp: clear to auscultation bilaterally, normal RR and effort noted GI: soft, no tenderness, with active bowel sounds.   Assessment: Encounter Diagnoses  Name Primary?   History of duodenal ulcer Yes   Heartburn      Plan: EGD  The benefits and risks of the planned procedure(s) were described in detail with the patient or (when appropriate) their health care proxy.  Risks were outlined as including, but not limited to, bleeding, infection, perforation, adverse medication reaction leading to cardiac or pulmonary decompensation, pancreatitis (if ERCP).  The limitation of incomplete mucosal visualization was also discussed.  No guarantees or warranties were given.  The patient was provided an opportunity to ask questions and all were answered. The patient agreed with the plan.   The patient is appropriate for an endoscopic procedure in the ambulatory setting.   - Victory Brand, MD

## 2024-05-20 NOTE — Patient Instructions (Addendum)
 Resume previous diet  Continue present medications  HM NAY QU V? ? TH?C HI?N TH? THU?T N?I SOI T?I TRUNG TM N?I SOI [place]: Vui lng xem bo co th? thu?t ? ???c g?i cho qu v?, n?u qu v? c b?t k? th?c m?c g trong su?t qu trnh th?m khm. N?u bo co th? thu?t khng th? gi?i ?p th?c m?c c?a qu v?, vui lng g?i cho bc s? chuyn khoa tiu ha c?a qu v? ?? ???c gi?i ?p. N?u qu v? ? yu c?u khng cung c?p thng tin chi ti?t v? k?t qu? th? thu?t cho ??i tc ch?m [place] c?a qu v?, th bo co th? thu?t s? ???c g?i trong m?t phong b ???c dn kn ?? qu v? xem khi thu?n ti?n.   QU V? C TH?: C?m gic ch??ng b?ng. Trung ti?n nhi?u h?n bnh th??ng. ?i b? c th? gip ??y ra ngoi khng kh ?i vo ???ng tiu ha trong khi th?c hi?n th? thu?t v gi?m ch??ng b?ng. N?u qu v? ti?n hnh n?i soi d??i (nh? n?i soi ??i trng ho?c soi k?t trng xch-ma b?ng ?ng m?m), qu v? c th? th?y cc ch?m mu ? phn ho?c trn gi?y v? sinh. N?u qu v? ? lm s?ch ??i trng ?? th?c hi?n th? thu?t, qu v? c th? khng ?i ??i ti?n nh? bnh th??ng trong vi ngy.  Vui Lng L?u : Qu v? c th? b? kch ?ng v ngh?t m?i ho?c ch?y n??c m?i. Tnh tr?ng ny l do ?nh h??ng c?a vi?c th? bnh oxy trong qu trnh th?c hi?n th? thu?t. Qu v? khng c?n lo l?ng, tnh tr?ng ny s? bi?n m?t sau m?t ho?c vi ngy.    Sau khi th?c hi?n n?i soi trn (EGD)  Nn ra mu ho?c ch?t mu c ph s?m Xu?t hi?n c?n ?au ng?c ho?c ?au d??i x??ng b? vai m?i Nu?t ?au ho?c kh nu?t M?i b? kh th? S?t t? 100F tr? ln Phn ?en nh? m?c  ??i v?i cc v?n ?? kh?n c?p ho?c c?p c?u, qu v? c th? lin h? bc s? chuyn khoa tiu ha b?t k? lc no b?ng cch g?i ??n s? [phone].   CH? ?? ?N U?NG: Chng ti [employer] v? tr??c tin nn ?n nh?, nh?ng sau ? qu v? c th? ?n theo ch? ?? bnh th??ng. U?ng nhi?u n??c nh?ng ph?i trnh ?? u?ng c c?n trong 24 gi?.  HO?T ??NG: Qu v? c?n ln k? ho?ch ?? ngh? ng?i trong ngy hm nay v [facility] NN LI XE ho?c s?  d?ng my mc n?ng cho ??n ngy mai (do tc d?ng c?a thu?c an th?n s? d?ng trong th? thu?t).   THEO DI: Nhn vin c?a chng ti s? g?i cho qu v? theo s? ?i?n tho?i trong b?nh n vo ngy lm vi?c ti?p theo sau ngy th?c hi?n th? thu?t ?? ki?m tra tnh tr?ng c?a qu v? v gi?i ?p cc cu h?i ho?c th?c m?c c?a qu v? v? thng tin m qu v? ???c cung c?p sau khi th?c hi?n th? thu?t. N?u chng ti khng lin l?c ???c v?i qu v?, chng ti s? ?? l?i tin nh?n. Tuy nhin, n?u qu v? c?m th?y kh?e v khng g?p b?t k? s? c? no, qu v? khng c?n g?i l?i cho chng ti. Chng ti s? gi? ??nh r?ng qu v? ? tr? l?i sinh ho?t bnh th??ng v khng g?p b?t k? s? c? no.  N?u qu v? ???c  l?y sinh thi?t, chng ti s? lin l?c v?i qu v? qua ?i?n tho?i ho?c th? trong 1-3 tu?n ti?p theo. Vui lng g?i cho chng ti theo s? [phone] n?u qu v? khng nh?n ???c thng tin v? k?t qu? sinh thi?t trong 3 tu?n.  CH? K/B?O M?TBETHA Do v? v/ho?c ??i tc ch?m [place] c?a qu v? ? k vo cc ti li?u s? ???c nh?p vo h? s? y t? ?i?n t? c?a qu v?. Ch? k ny xc nh?n r?ng cc thng tin trn ?y trong B?n Tm T?t Sau Khi Th?m Khm c?a qu v? ? ???c xem xt v hi?u r. Qu v? v/ho?c

## 2024-05-21 ENCOUNTER — Telehealth: Payer: Self-pay | Admitting: *Deleted

## 2024-05-21 NOTE — Telephone Encounter (Signed)
  Follow up Call-     05/20/2024    9:18 AM  Call back number  Post procedure Call Back phone  # [phone]  Permission to leave phone message Yes     Patient questions:  Do you have a fever, pain , or abdominal swelling? No. Pain Score  0 *  Have you tolerated food without any problems? Yes.    Have you been able to return to your normal activities? Yes.    Do you have any questions about your discharge instructions: Diet   No. Medications  No. Follow up visit  No.  Do you have questions or concerns about your Care? No.  Actions: * If pain score is 4 or above: No action needed, pain <4.

## 2024-05-22 LAB — SURGICAL PATHOLOGY

## 2024-05-26 ENCOUNTER — Ambulatory Visit: Payer: Self-pay | Admitting: Gastroenterology

## 2024-05-28 ENCOUNTER — Encounter: Payer: Self-pay | Admitting: Family Medicine

## 2024-05-28 ENCOUNTER — Ambulatory Visit: Admitting: Family Medicine

## 2024-05-28 DIAGNOSIS — M1712 Unilateral primary osteoarthritis, left knee: Secondary | ICD-10-CM

## 2024-05-28 DIAGNOSIS — I10 Essential (primary) hypertension: Secondary | ICD-10-CM

## 2024-05-28 DIAGNOSIS — K219 Gastro-esophageal reflux disease without esophagitis: Secondary | ICD-10-CM | POA: Diagnosis not present

## 2024-05-28 DIAGNOSIS — E785 Hyperlipidemia, unspecified: Secondary | ICD-10-CM

## 2024-05-28 MED ORDER — DICLOFENAC SODIUM 1 % EX GEL: 4.0000 g | Freq: Four times a day (QID) | CUTANEOUS | 2 refills | Status: AC

## 2024-05-28 MED ORDER — PANTOPRAZOLE SODIUM 40 MG PO TBEC: 40.0000 mg | DELAYED_RELEASE_TABLET | Freq: Two times a day (BID) | ORAL | 3 refills | Status: AC

## 2024-05-28 MED ORDER — HYDROCHLOROTHIAZIDE 25 MG PO TABS: 25.0000 mg | ORAL_TABLET | Freq: Every day | ORAL | 1 refills | Status: AC

## 2024-05-28 MED ORDER — ATORVASTATIN CALCIUM 40 MG PO TABS: 40.0000 mg | ORAL_TABLET | Freq: Every day | ORAL | 1 refills | Status: AC

## 2024-05-28 MED ORDER — LOSARTAN POTASSIUM 25 MG PO TABS: 50.0000 mg | ORAL_TABLET | Freq: Every day | ORAL | 0 refills | Status: AC

## 2024-05-28 NOTE — Assessment & Plan Note (Signed)
 Encourage heart healthy diet such as MIND or DASH diet, increase exercise, avoid trans fats, simple carbohydrates and processed foods, consider a krill or fish or flaxseed oil cap daily.

## 2024-05-28 NOTE — Assessment & Plan Note (Signed)
 Well controlled, no changes to meds. Encouraged heart healthy diet such as the DASH diet and exercise as tolerated.

## 2024-05-28 NOTE — Progress Notes (Signed)
 Subjective:    Patient ID: Lance Oneal, male    DOB: 08-Oct-1950, 73 y.o.   MRN: [id number]  Chief Complaint  Patient presents with   Hypertension   Hyperlipidemia   Follow-up    HPI Patient is in today for f/u.  Discussed the use of AI scribe software for clinical note transcription with the patient, who gave verbal consent to proceed.  History of Present Illness Lance Oneal is a 73 year old male who presents for medication refills and follow-up on recent gastroenterology evaluation.  He recently visited a gastroenterologist where a scan of his stomach was performed. He has not yet received the results but was informed that a letter was sent two days ago. He is concerned about the mention of microscopic changes in his stomach. He continues to take Pantol for his stomach issues and inquires about any changes to his medication regimen. He is concerned about H. pylori infection, but test results were normal.  He requests refills for his knee pain medication, specifically a cream, as he experiences intermittent pain.  He mentions that his blood pressure is well-controlled and wants to check his cholesterol levels during this visit.    Past Medical History:  Diagnosis Date   BPH (benign prostatic hyperplasia)    GERD (gastroesophageal reflux disease)    History of gastric ulcer    per pt 1970s or 1980s s/p  open surgery for stomach ulcer   Hyperlipidemia    Hypertension    Right hydrocele    Urinary urgency    refractory    Past Surgical History:  Procedure Laterality Date   ABDOMINAL SURGERY     per pt done approxl 1970s or 1980s  for stomach ulcer (has large midline incision)   EYE SURGERY     HYDROCELE EXCISION Right 07/30/2021   Procedure: HYDROCELECTOMY ADULT AND RIGHT ORCHIOPEXY;  Surgeon: Alvaro Hummer, MD;  Location: [facility];  Service: Urology;  Laterality: Right;   TRANSURETHRAL RESECTION OF PROSTATE N/A 07/30/2021   Procedure: TRANSURETHRAL  RESECTION OF THE PROSTATE (TURP);  Surgeon: Alvaro Hummer, MD;  Location: [facility];  Service: Urology;  Laterality: N/A;    Family History  Problem Relation Age of Onset   Hypertension Mother    Liver cancer Father    Liver cancer Other    Colon cancer Neg Hx    Pancreatic cancer Neg Hx    Stomach cancer Neg Hx     Social History   Socioeconomic History   Marital status: Married    Spouse name: Not on file   Number of children: 1   Years of education: Not on file   Highest education level: Not on file  Occupational History   Occupation: retired  Tobacco Use   Smoking status: Former    Current packs/day: 0.00    Types: Cigarettes    Quit date: 02/26/2005    Years since quitting: 19.2   Smokeless tobacco: Never  Vaping Use   Vaping status: Never Used  Substance and Sexual Activity   Alcohol use: Not Currently   Drug use: Never   Sexual activity: Not Currently    Partners: Female  Other Topics Concern   Not on file  Social History Narrative   Not on file   Social Drivers of Health   Financial Resource Strain: Low Risk  (10/26/2021)   Overall Financial Resource Strain (CARDIA)    Difficulty of Paying Living Expenses: Not hard at all  Food Insecurity: No Food Insecurity (12/28/2022)   Hunger Vital Sign    Worried About Running Out of Food in the Last Year: Never true    Ran Out of Food in the Last Year: Never true  Transportation Needs: No Transportation Needs (12/28/2022)   PRAPARE - Administrator, Civil Service (Medical): No    Lack of Transportation (Non-Medical): No  Physical Activity: Sufficiently Active (12/28/2022)   Exercise Vital Sign    Days of Exercise per Week: 7 days    Minutes of Exercise per Session: 150+ min  Stress: No Stress Concern Present (10/26/2021)   [employer] of Occupational Health - Occupational Stress Questionnaire    Feeling of Stress : Not at all  Social Connections: Moderately Integrated  (10/26/2021)   Social Connection and Isolation Panel    Frequency of Communication with Friends and Family: More than three times a week    Frequency of Social Gatherings with Friends and Family: More than three times a week    Attends Religious Services: Never    Database Administrator or Organizations: Yes    Attends Banker Meetings: Never    Marital Status: Married  Catering Manager Violence: Not At Risk (10/26/2021)   Humiliation, Afraid, Rape, and Kick questionnaire    Fear of Current or Ex-Partner: No    Emotionally Abused: No    Physically Abused: No    Sexually Abused: No    Outpatient Medications Prior to Visit  Medication Sig Dispense Refill   loratadine  (CLARITIN ) 10 MG tablet Take 1 tablet (10 mg total) by mouth daily. 30 tablet 11   Multiple Vitamin (MULTI-VITAMIN) tablet Take 1 tablet by mouth daily.     ofloxacin (OCUFLOX) 0.3 % ophthalmic solution Place 1 drop into the right eye 4 (four) times daily. 5 mL 0   atorvastatin  (LIPITOR) 40 MG tablet Take 1 tablet (40 mg total) by mouth daily. 90 tablet 1   hydrochlorothiazide  (HYDRODIURIL ) 25 MG tablet Take 1 tablet (25 mg total) by mouth daily. 90 tablet 1   losartan  (COZAAR ) 25 MG tablet TAKE 2 TABLETS BY MOUTH EVERY DAY 180 tablet 0   pantoprazole  (PROTONIX ) 40 MG tablet Take 1 tablet (40 mg total) by mouth 2 (two) times daily. 180 tablet 3   finasteride  (PROSCAR ) 5 MG tablet Take 1 tablet (5 mg total) by mouth daily. (Patient not taking: Reported on 05/28/2024) 90 tablet 1   fluticasone  (FLONASE ) 50 MCG/ACT nasal spray SPRAY 2 SPRAYS INTO EACH NOSTRIL EVERY DAY (Patient not taking: Reported on 05/28/2024) 48 mL 2   levocetirizine (XYZAL ) 5 MG tablet TAKE 1 TABLET BY MOUTH EVERY DAY IN THE EVENING (Patient not taking: Reported on 05/28/2024) 90 tablet 1   tolterodine (DETROL) 2 MG tablet  (Patient not taking: Reported on 05/28/2024)     traMADol  (ULTRAM ) 50 MG tablet Take 1 tablet (50 mg total) by mouth every 6  (six) hours as needed (pain). (Patient not taking: Reported on 05/28/2024) 12 tablet 0   triamcinolone  cream (KENALOG ) 0.1 % Apply 1 Application topically 2 (two) times daily. (Patient not taking: Reported on 05/28/2024) 30 g 1   diclofenac  Sodium (VOLTAREN ) 1 % GEL Apply 4 g topically 4 (four) times daily. (Patient not taking: Reported on 05/28/2024) 150 g 2   No facility-administered medications prior to visit.    Allergies  Allergen Reactions   Lisinopril  Other (See Comments)    Cough    Sulfa  Antibiotics Other (See Comments)  insomnia    Review of Systems  Constitutional:  Negative for fever and malaise/fatigue.  HENT:  Negative for congestion.   Eyes:  Negative for blurred vision.  Respiratory:  Negative for shortness of breath.   Cardiovascular:  Negative for chest pain, palpitations and leg swelling.  Gastrointestinal:  Negative for abdominal pain, blood in stool and nausea.  Genitourinary:  Negative for dysuria and frequency.  Musculoskeletal:  Negative for falls.  Skin:  Negative for rash.  Neurological:  Negative for dizziness, loss of consciousness and headaches.  Endo/Heme/Allergies:  Negative for environmental allergies.  Psychiatric/Behavioral:  Negative for depression. The patient is not nervous/anxious.        Objective:    Physical Exam Vitals and nursing note reviewed.  Constitutional:      General: He is not in acute distress.    Appearance: Normal appearance. He is well-developed.  HENT:     Head: Normocephalic and atraumatic.  Eyes:     General: No scleral icterus.       Right eye: No discharge.        Left eye: No discharge.  Cardiovascular:     Rate and Rhythm: Normal rate and regular rhythm.     Heart sounds: No murmur heard. Pulmonary:     Effort: Pulmonary effort is normal. No respiratory distress.     Breath sounds: Normal breath sounds.  Musculoskeletal:        General: Normal range of motion.     Cervical back: Normal range of motion  and neck supple.     Right lower leg: No edema.     Left lower leg: No edema.  Skin:    General: Skin is warm and dry.  Neurological:     Mental Status: He is alert and oriented to person, place, and time.  Psychiatric:        Mood and Affect: Mood normal.        Behavior: Behavior normal.        Thought Content: Thought content normal.        Judgment: Judgment normal.     BP 110/70 (BP Location: Left Arm, Patient Position: Sitting, Cuff Size: Normal)   Pulse 70   Temp 98.2 F (36.8 C) (Oral)   Resp 16   Ht 5' (1.524 m)   Wt 129 lb 3.2 oz (58.6 kg)   SpO2 98%   BMI 25.23 kg/m  Wt Readings from Last 3 Encounters:  05/28/24 129 lb 3.2 oz (58.6 kg)  05/20/24 128 lb 6.4 oz (58.2 kg)  04/11/24 130 lb (59 kg)    Diabetic Foot Exam - Simple   No data filed    Lab Results  Component Value Date   WBC 8.0 12/12/2023   HGB 14.8 12/12/2023   HCT 44.5 12/12/2023   PLT 309.0 12/12/2023   GLUCOSE 94 12/12/2023   CHOL 143 12/12/2023   TRIG 106.0 12/12/2023   HDL 44.90 12/12/2023   LDLDIRECT 97.0 01/13/2022   LDLCALC 77 12/12/2023   ALT 32 12/12/2023   AST 29 12/12/2023   NA 141 12/12/2023   K 4.7 12/12/2023   CL 103 12/12/2023   CREATININE 1.16 12/12/2023   BUN 27 (H) 12/12/2023   CO2 30 12/12/2023   TSH 0.63 12/12/2023   PSA 0.42 06/13/2023    Lab Results  Component Value Date   TSH 0.63 12/12/2023   Lab Results  Component Value Date   WBC 8.0 12/12/2023   HGB 14.8 12/12/2023  HCT 44.5 12/12/2023   MCV 90.0 12/12/2023   PLT 309.0 12/12/2023   Lab Results  Component Value Date   NA 141 12/12/2023   K 4.7 12/12/2023   CO2 30 12/12/2023   GLUCOSE 94 12/12/2023   BUN 27 (H) 12/12/2023   CREATININE 1.16 12/12/2023   BILITOT 1.2 12/12/2023   ALKPHOS 63 12/12/2023   AST 29 12/12/2023   ALT 32 12/12/2023   PROT 7.2 12/12/2023   ALBUMIN 4.6 12/12/2023   CALCIUM  9.6 12/12/2023   EGFR 59 (L) 04/11/2022   GFR 62.88 12/12/2023   Lab Results  Component  Value Date   CHOL 143 12/12/2023   Lab Results  Component Value Date   HDL 44.90 12/12/2023   Lab Results  Component Value Date   LDLCALC 77 12/12/2023   Lab Results  Component Value Date   TRIG 106.0 12/12/2023   Lab Results  Component Value Date   CHOLHDL 3 12/12/2023   No results found for: HGBA1C     Assessment & Plan:  Hyperlipidemia, unspecified hyperlipidemia type Assessment & Plan: Encourage heart healthy diet such as MIND or DASH diet, increase exercise, avoid trans fats, simple carbohydrates and processed foods, consider a krill or fish or flaxseed oil cap daily.    Orders: -     Atorvastatin  Calcium ; Take 1 tablet (40 mg total) by mouth daily.  Dispense: 90 tablet; Refill: 1 -     Comprehensive metabolic panel with GFR -     Lipid panel  Essential hypertension Assessment & Plan: Well controlled, no changes to meds. Encouraged heart healthy diet such as the DASH diet and exercise as tolerated.    Orders: -     hydroCHLOROthiazide ; Take 1 tablet (25 mg total) by mouth daily.  Dispense: 90 tablet; Refill: 1 -     Losartan  Potassium; Take 2 tablets (50 mg total) by mouth daily.  Dispense: 180 tablet; Refill: 0 -     Comprehensive metabolic panel with GFR -     Lipid panel  Gastroesophageal reflux disease, unspecified whether esophagitis present -     Pantoprazole  Sodium; Take 1 tablet (40 mg total) by mouth 2 (two) times daily.  Dispense: 180 tablet; Refill: 3  Primary osteoarthritis of left knee -     Diclofenac  Sodium; Apply 4 g topically 4 (four) times daily.  Dispense: 150 g; Refill: 2  Assessment and Plan Assessment & Plan Essential hypertension   Blood pressure is well-controlled with the current medication regimen.  Hyperlipidemia   Cholesterol levels need assessment. Ordered a blood test to check cholesterol levels.  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 No H. pylori infection detected. Continue current medication for GERD. Repeat gastroendoscopy in three years for surveillance.  Unilateral primary osteoarthritis, left knee   Intermittent knee pain is managed with topical cream. Refilled prescription for topical cream.  General Health Maintenance   Routine health maintenance discussed. Ordered a blood test to check cholesterol levels.    Ashvik Grundman R Lowne Chase, DO

## 2024-05-29 LAB — COMPREHENSIVE METABOLIC PANEL WITH GFR
ALT: 29 U/L (ref 0–53)
AST: 24 U/L (ref 0–37)
Albumin: 4.6 g/dL (ref 3.5–5.2)
Alkaline Phosphatase: 80 U/L (ref 39–117)
BUN: 20 mg/dL (ref 6–23)
CO2: 30 meq/L (ref 19–32)
Calcium: 9.5 mg/dL (ref 8.4–10.5)
Chloride: 101 meq/L (ref 96–112)
Creatinine, Ser: 0.97 mg/dL (ref 0.40–1.50)
GFR: 77.68 mL/min (ref >60.00)
Glucose, Bld: 93 mg/dL (ref 70–99)
Potassium: 3.9 meq/L (ref 3.5–5.1)
Sodium: 141 meq/L (ref 135–145)
Total Bilirubin: 0.8 mg/dL (ref 0.2–1.2)
Total Protein: 7.2 g/dL (ref 6.0–8.3)

## 2024-05-29 LAB — LIPID PANEL
Cholesterol: 136 mg/dL (ref 0–200)
HDL: 35.3 mg/dL — ABNORMAL LOW (ref >39.00)
LDL Cholesterol: 65 mg/dL (ref 0–99)
NonHDL: 100.62
Total CHOL/HDL Ratio: 4
Triglycerides: 177 mg/dL — ABNORMAL HIGH (ref 0.0–149.0)
VLDL: 35.4 mg/dL (ref 0.0–40.0)

## 2024-05-30 ENCOUNTER — Ambulatory Visit: Payer: Self-pay | Admitting: Family Medicine

## 2024-11-25 ENCOUNTER — Ambulatory Visit: Admitting: Family Medicine
